# Patient Record
Sex: Male | Born: 1950 | Race: Black or African American | Hispanic: No | Marital: Married | Smoking: Never smoker
Health system: Southern US, Community
[De-identification: ages and names within clinical notes are randomized; demographics above are authoritative.]

## PROBLEM LIST (undated history)

## (undated) ENCOUNTER — Inpatient Hospital Stay: Admission: EM | Payer: Self-pay | Source: Home / Self Care

## (undated) DIAGNOSIS — I10 Essential (primary) hypertension: Secondary | ICD-10-CM

## (undated) DIAGNOSIS — I219 Acute myocardial infarction, unspecified: Secondary | ICD-10-CM

## (undated) DIAGNOSIS — C801 Malignant (primary) neoplasm, unspecified: Secondary | ICD-10-CM

## (undated) HISTORY — DX: Essential (primary) hypertension: I10

## (undated) HISTORY — PX: TONSILLECTOMY: SUR1361

---

## 2015-08-15 ENCOUNTER — Encounter (HOSPITAL_COMMUNITY): Payer: Self-pay | Admitting: Cardiology

## 2015-08-15 ENCOUNTER — Encounter (HOSPITAL_COMMUNITY)
Admission: AD | Disposition: A | Discharge status: Home / Self Care | Payer: Self-pay | Source: Other Acute Inpatient Hospital | Attending: Cardiology

## 2015-08-15 ENCOUNTER — Ambulatory Visit (HOSPITAL_COMMUNITY): Payer: BLUE CROSS/BLUE SHIELD

## 2015-08-15 ENCOUNTER — Inpatient Hospital Stay (HOSPITAL_COMMUNITY)
Admission: AD | Admit: 2015-08-15 | Discharge: 2015-08-16 | DRG: 246 | Disposition: A | Discharge status: Home / Self Care | Payer: BLUE CROSS/BLUE SHIELD | Source: Other Acute Inpatient Hospital | Attending: Cardiology | Admitting: Cardiology

## 2015-08-15 ENCOUNTER — Encounter (HOSPITAL_COMMUNITY)
Admission: AD | Disposition: A | Discharge status: Home / Self Care | Payer: Medicare Other | Source: Other Acute Inpatient Hospital | Attending: Cardiology

## 2015-08-15 DIAGNOSIS — R7303 Prediabetes: Secondary | ICD-10-CM | POA: Diagnosis present

## 2015-08-15 DIAGNOSIS — I200 Unstable angina: Secondary | ICD-10-CM | POA: Diagnosis not present

## 2015-08-15 DIAGNOSIS — Z7902 Long term (current) use of antithrombotics/antiplatelets: Secondary | ICD-10-CM | POA: Diagnosis not present

## 2015-08-15 DIAGNOSIS — Z720 Tobacco use: Secondary | ICD-10-CM | POA: Insufficient documentation

## 2015-08-15 DIAGNOSIS — I4510 Unspecified right bundle-branch block: Secondary | ICD-10-CM | POA: Diagnosis present

## 2015-08-15 DIAGNOSIS — I10 Essential (primary) hypertension: Secondary | ICD-10-CM | POA: Diagnosis present

## 2015-08-15 DIAGNOSIS — Q211 Atrial septal defect: Secondary | ICD-10-CM | POA: Diagnosis not present

## 2015-08-15 DIAGNOSIS — R739 Hyperglycemia, unspecified: Secondary | ICD-10-CM | POA: Diagnosis not present

## 2015-08-15 DIAGNOSIS — I2542 Coronary artery dissection: Secondary | ICD-10-CM | POA: Diagnosis not present

## 2015-08-15 DIAGNOSIS — Z7982 Long term (current) use of aspirin: Secondary | ICD-10-CM | POA: Diagnosis not present

## 2015-08-15 DIAGNOSIS — E876 Hypokalemia: Secondary | ICD-10-CM | POA: Diagnosis present

## 2015-08-15 DIAGNOSIS — I214 Non-ST elevation (NSTEMI) myocardial infarction: Secondary | ICD-10-CM | POA: Diagnosis not present

## 2015-08-15 DIAGNOSIS — R079 Chest pain, unspecified: Secondary | ICD-10-CM | POA: Diagnosis not present

## 2015-08-15 DIAGNOSIS — F17200 Nicotine dependence, unspecified, uncomplicated: Secondary | ICD-10-CM | POA: Diagnosis present

## 2015-08-15 DIAGNOSIS — I25110 Atherosclerotic heart disease of native coronary artery with unstable angina pectoris: Secondary | ICD-10-CM | POA: Diagnosis present

## 2015-08-15 DIAGNOSIS — I440 Atrioventricular block, first degree: Secondary | ICD-10-CM | POA: Diagnosis present

## 2015-08-15 DIAGNOSIS — Z9181 History of falling: Secondary | ICD-10-CM

## 2015-08-15 DIAGNOSIS — I237 Postinfarction angina: Secondary | ICD-10-CM | POA: Diagnosis not present

## 2015-08-15 HISTORY — PX: CARDIAC CATHETERIZATION: SHX172

## 2015-08-15 LAB — TYPE AND SCREEN
ABO/RH(D): A POS
Antibody Screen: NEGATIVE

## 2015-08-15 LAB — MAGNESIUM: MAGNESIUM: 2.1 mg/dL (ref 1.7–2.4)

## 2015-08-15 LAB — CBC
HCT: 46.6 % (ref 39.0–52.0)
HEMATOCRIT: 46.6 % (ref 39.0–52.0)
HEMOGLOBIN: 16 g/dL (ref 13.0–17.0)
Hemoglobin: 15.8 g/dL (ref 13.0–17.0)
MCH: 31.1 pg (ref 26.0–34.0)
MCH: 31.3 pg (ref 26.0–34.0)
MCHC: 33.9 g/dL (ref 30.0–36.0)
MCHC: 34.3 g/dL (ref 30.0–36.0)
MCV: 91.7 fL (ref 78.0–100.0)
MCV: 91.2 fL (ref 78.0–100.0)
PLATELETS: 219 10*3/uL (ref 150–400)
Platelets: 218 10*3/uL (ref 150–400)
RBC: 5.08 MIL/uL (ref 4.22–5.81)
RBC: 5.11 MIL/uL (ref 4.22–5.81)
RDW: 12.1 % (ref 11.5–15.5)
RDW: 12 % (ref 11.5–15.5)
WBC: 7.5 10*3/uL (ref 4.0–10.5)
WBC: 8.2 10*3/uL (ref 4.0–10.5)

## 2015-08-15 LAB — COMPREHENSIVE METABOLIC PANEL
ALT: 22 U/L (ref 17–63)
AST: 42 U/L — ABNORMAL HIGH (ref 15–41)
Albumin: 4 g/dL (ref 3.5–5.0)
Alkaline Phosphatase: 77 U/L (ref 38–126)
Anion gap: 12 (ref 5–15)
BUN: 9 mg/dL (ref 6–20)
CHLORIDE: 99 mmol/L — AB (ref 101–111)
CO2: 27 mmol/L (ref 22–32)
CREATININE: 1.04 mg/dL (ref 0.61–1.24)
Calcium: 8.7 mg/dL — ABNORMAL LOW (ref 8.9–10.3)
Glucose, Bld: 220 mg/dL — ABNORMAL HIGH (ref 65–99)
POTASSIUM: 3.7 mmol/L (ref 3.5–5.1)
Sodium: 138 mmol/L (ref 135–145)
TOTAL PROTEIN: 7.1 g/dL (ref 6.5–8.1)
Total Bilirubin: 3.3 mg/dL — ABNORMAL HIGH (ref 0.3–1.2)

## 2015-08-15 LAB — LIPID PANEL
CHOL/HDL RATIO: 4.4 ratio
CHOLESTEROL: 214 mg/dL — AB (ref 0–200)
HDL: 49 mg/dL (ref >40)
LDL Cholesterol: 148 mg/dL — ABNORMAL HIGH (ref 0–99)
TRIGLYCERIDES: 86 mg/dL (ref <150)
VLDL: 17 mg/dL (ref 0–40)

## 2015-08-15 LAB — TROPONIN I: TROPONIN I: 0.5 ng/mL — AB (ref <0.031)

## 2015-08-15 LAB — PROTIME-INR
INR: 1.13 (ref 0.00–1.49)
PROTHROMBIN TIME: 14.7 s (ref 11.6–15.2)

## 2015-08-15 LAB — MRSA PCR SCREENING: MRSA BY PCR: NEGATIVE

## 2015-08-15 LAB — ABO/RH: ABO/RH(D): A POS

## 2015-08-15 SURGERY — LEFT HEART CATH AND CORONARY ANGIOGRAPHY

## 2015-08-15 MED ORDER — SODIUM CHLORIDE 0.9 % IJ SOLN: 3.0000 mL | INTRAMUSCULAR | Status: DC | PRN

## 2015-08-15 MED ORDER — NITROGLYCERIN IN D5W 200-5 MCG/ML-% IV SOLN
3.0000 ug/min | INTRAVENOUS | Status: DC
  Filled 2015-08-15: qty 250

## 2015-08-15 MED ORDER — SODIUM CHLORIDE 0.9 % IJ SOLN
3.0000 mL | INTRAMUSCULAR | Status: DC | PRN
Start: 2015-08-15 — End: 2015-08-16

## 2015-08-15 MED ORDER — PROMETHAZINE HCL 25 MG/ML IJ SOLN: 12.5000 mg | Freq: Four times a day (QID) | INTRAMUSCULAR | Status: DC | PRN

## 2015-08-15 MED ORDER — SODIUM CHLORIDE 0.9 % IV SOLN: 250.0000 mL | INTRAVENOUS | Status: DC | PRN

## 2015-08-15 MED ORDER — EPTIFIBATIDE 75 MG/100ML IV SOLN
2.0000 ug/kg/min | INTRAVENOUS | Status: AC
  Administered 2015-08-15: 2 ug/kg/min via INTRAVENOUS
  Filled 2015-08-15: qty 100

## 2015-08-15 MED ORDER — NITROGLYCERIN 0.4 MG/SPRAY TL SOLN
Status: AC
  Filled 2015-08-15: qty 4.9

## 2015-08-15 MED ORDER — SODIUM CHLORIDE 0.9 % WEIGHT BASED INFUSION: 3.0000 mL/kg/h | INTRAVENOUS | Status: AC

## 2015-08-15 MED ORDER — NITROGLYCERIN 1 MG/10 ML FOR IR/CATH LAB
INTRA_ARTERIAL | Status: AC
  Filled 2015-08-15: qty 10

## 2015-08-15 MED ORDER — MORPHINE SULFATE (PF) 2 MG/ML IV SOLN
INTRAVENOUS | Status: AC
  Filled 2015-08-15: qty 1

## 2015-08-15 MED ORDER — ATORVASTATIN CALCIUM 80 MG PO TABS
80.0000 mg | ORAL_TABLET | Freq: Every day | ORAL | Status: DC
  Filled 2015-08-15: qty 1

## 2015-08-15 MED ORDER — ASPIRIN 81 MG PO CHEW
324.0000 mg | CHEWABLE_TABLET | ORAL | Status: AC
  Administered 2015-08-15: 324 mg via ORAL
  Filled 2015-08-15: qty 4

## 2015-08-15 MED ORDER — HYDROMORPHONE HCL 1 MG/ML IJ SOLN
INTRAMUSCULAR | Status: AC
  Filled 2015-08-15: qty 1

## 2015-08-15 MED ORDER — VERAPAMIL HCL 2.5 MG/ML IV SOLN
INTRAVENOUS | Status: AC
  Filled 2015-08-15: qty 2

## 2015-08-15 MED ORDER — SODIUM CHLORIDE 0.9 % IJ SOLN: 3.0000 mL | Freq: Two times a day (BID) | INTRAMUSCULAR | Status: DC

## 2015-08-15 MED ORDER — ASPIRIN 81 MG PO CHEW: 81.0000 mg | CHEWABLE_TABLET | ORAL | Status: DC

## 2015-08-15 MED ORDER — LIDOCAINE HCL (PF) 1 % IJ SOLN
INTRAMUSCULAR | Status: DC | PRN
Start: 2015-08-15 — End: 2015-08-15
  Administered 2015-08-15: 2 mL

## 2015-08-15 MED ORDER — HEPARIN SODIUM (PORCINE) 1000 UNIT/ML IJ SOLN
INTRAMUSCULAR | Status: AC
  Filled 2015-08-15: qty 1

## 2015-08-15 MED ORDER — FENTANYL CITRATE (PF) 100 MCG/2ML IJ SOLN
INTRAMUSCULAR | Status: AC
  Filled 2015-08-15: qty 2

## 2015-08-15 MED ORDER — SODIUM CHLORIDE 0.9 % IV SOLN
INTRAVENOUS | Status: DC
  Administered 2015-08-15: 999 mL via INTRAVENOUS
  Administered 2015-08-15: 50 mL via INTRAVENOUS

## 2015-08-15 MED ORDER — CLOPIDOGREL BISULFATE 75 MG PO TABS
75.0000 mg | ORAL_TABLET | Freq: Every day | ORAL | Status: DC
Start: 2015-08-15 — End: 2015-08-15
  Administered 2015-08-15: 75 mg via ORAL
  Filled 2015-08-15: qty 1

## 2015-08-15 MED ORDER — HEPARIN (PORCINE) IN NACL 2-0.9 UNIT/ML-% IJ SOLN
INTRAMUSCULAR | Status: AC
  Filled 2015-08-15: qty 1500

## 2015-08-15 MED ORDER — METOPROLOL TARTRATE 12.5 MG HALF TABLET
12.5000 mg | ORAL_TABLET | Freq: Two times a day (BID) | ORAL | Status: DC
  Administered 2015-08-15 – 2015-08-16 (×4): 12.5 mg via ORAL
  Filled 2015-08-15 (×4): qty 1

## 2015-08-15 MED ORDER — ACETAMINOPHEN 325 MG PO TABS: 650.0000 mg | ORAL_TABLET | ORAL | Status: DC | PRN

## 2015-08-15 MED ORDER — ONDANSETRON HCL 4 MG/2ML IJ SOLN
INTRAMUSCULAR | Status: AC
  Filled 2015-08-15: qty 2

## 2015-08-15 MED ORDER — MIDAZOLAM HCL 2 MG/2ML IJ SOLN
INTRAMUSCULAR | Status: DC | PRN
  Administered 2015-08-15 (×2): 1 mg via INTRAVENOUS

## 2015-08-15 MED ORDER — LIDOCAINE HCL (PF) 1 % IJ SOLN
INTRAMUSCULAR | Status: AC
  Filled 2015-08-15: qty 30

## 2015-08-15 MED ORDER — SODIUM CHLORIDE 0.9 % WEIGHT BASED INFUSION
3.0000 mL/kg/h | INTRAVENOUS | Status: AC
  Administered 2015-08-15 (×2): 3 mL/kg/h via INTRAVENOUS

## 2015-08-15 MED ORDER — IOHEXOL 350 MG/ML SOLN
INTRAVENOUS | Status: DC | PRN
  Administered 2015-08-15: 50 mL via INTRAVENOUS
  Administered 2015-08-15 (×2): 100 mL via INTRAVENOUS

## 2015-08-15 MED ORDER — IOHEXOL 350 MG/ML SOLN
INTRAVENOUS | Status: DC | PRN
  Administered 2015-08-15: 75 mL via INTRACARDIAC

## 2015-08-15 MED ORDER — MORPHINE SULFATE (PF) 2 MG/ML IV SOLN
2.0000 mg | INTRAVENOUS | Status: DC | PRN
  Administered 2015-08-15 (×3): 2 mg via INTRAVENOUS
  Filled 2015-08-15: qty 1

## 2015-08-15 MED ORDER — SODIUM CHLORIDE 0.9 % IJ SOLN
3.0000 mL | Freq: Two times a day (BID) | INTRAMUSCULAR | Status: DC
  Administered 2015-08-16: 3 mL via INTRAVENOUS

## 2015-08-15 MED ORDER — BIVALIRUDIN 250 MG IV SOLR
250.0000 mg | INTRAVENOUS | Status: DC | PRN
  Administered 2015-08-15 (×2): 1.75 mg/kg/h via INTRAVENOUS

## 2015-08-15 MED ORDER — FENTANYL CITRATE (PF) 100 MCG/2ML IJ SOLN
INTRAMUSCULAR | Status: DC | PRN
  Administered 2015-08-15 (×4): 25 ug via INTRAVENOUS

## 2015-08-15 MED ORDER — SODIUM CHLORIDE 0.9 % IV SOLN
250.0000 mL | INTRAVENOUS | Status: DC | PRN
Start: 2015-08-15 — End: 2015-08-16

## 2015-08-15 MED ORDER — SODIUM CHLORIDE 0.9 % IJ SOLN
3.0000 mL | Freq: Two times a day (BID) | INTRAMUSCULAR | Status: DC
  Administered 2015-08-15 – 2015-08-16 (×2): 3 mL via INTRAVENOUS

## 2015-08-15 MED ORDER — BIVALIRUDIN BOLUS VIA INFUSION - CUPID
INTRAVENOUS | Status: DC | PRN
  Administered 2015-08-15: 83.325 mg via INTRAVENOUS

## 2015-08-15 MED ORDER — VERAPAMIL HCL 2.5 MG/ML IV SOLN
INTRAVENOUS | Status: DC | PRN
  Administered 2015-08-15: 3 mL via INTRA_ARTERIAL

## 2015-08-15 MED ORDER — BIVALIRUDIN 250 MG IV SOLR
INTRAVENOUS | Status: AC
  Filled 2015-08-15: qty 250

## 2015-08-15 MED ORDER — SODIUM CHLORIDE 0.9 % IV SOLN: INTRAVENOUS | Status: DC

## 2015-08-15 MED ORDER — NITROGLYCERIN 0.4 MG SL SUBL: 0.4000 mg | SUBLINGUAL_TABLET | SUBLINGUAL | Status: DC | PRN

## 2015-08-15 MED ORDER — HYDROMORPHONE HCL 1 MG/ML IJ SOLN
1.0000 mg | Freq: Once | INTRAMUSCULAR | Status: AC
  Administered 2015-08-15: 1 mg via INTRAVENOUS

## 2015-08-15 MED ORDER — POTASSIUM CHLORIDE 10 MEQ/100ML IV SOLN
10.0000 meq | INTRAVENOUS | Status: DC
  Administered 2015-08-15: 10 meq via INTRAVENOUS
  Filled 2015-08-15: qty 100

## 2015-08-15 MED ORDER — ASPIRIN EC 81 MG PO TBEC
81.0000 mg | DELAYED_RELEASE_TABLET | Freq: Every day | ORAL | Status: DC
  Administered 2015-08-16: 81 mg via ORAL
  Filled 2015-08-15: qty 1

## 2015-08-15 MED ORDER — EPTIFIBATIDE 75 MG/100ML IV SOLN
2.0000 ug/kg/min | INTRAVENOUS | Status: DC
  Administered 2015-08-15: 2 ug/kg/min via INTRAVENOUS
  Filled 2015-08-15 (×5): qty 100

## 2015-08-15 MED ORDER — ONDANSETRON HCL 4 MG/2ML IJ SOLN
4.0000 mg | Freq: Four times a day (QID) | INTRAMUSCULAR | Status: DC | PRN
  Administered 2015-08-15 (×4): 4 mg via INTRAVENOUS
  Filled 2015-08-15 (×3): qty 2

## 2015-08-15 MED ORDER — VERAPAMIL HCL 2.5 MG/ML IV SOLN
INTRA_ARTERIAL | Status: DC | PRN
  Administered 2015-08-15: 10:00:00 via INTRA_ARTERIAL

## 2015-08-15 MED ORDER — LIDOCAINE HCL (PF) 1 % IJ SOLN
INTRAMUSCULAR | Status: DC | PRN
  Administered 2015-08-15: 30 mL via SUBCUTANEOUS

## 2015-08-15 MED ORDER — SODIUM CHLORIDE 0.9 % IJ SOLN
3.0000 mL | Freq: Two times a day (BID) | INTRAMUSCULAR | Status: DC
Start: 2015-08-15 — End: 2015-08-15

## 2015-08-15 MED ORDER — HEPARIN SODIUM (PORCINE) 1000 UNIT/ML IJ SOLN
INTRAMUSCULAR | Status: DC | PRN
  Administered 2015-08-15: 5000 [IU] via INTRAVENOUS

## 2015-08-15 MED ORDER — ASPIRIN 300 MG RE SUPP: 300.0000 mg | RECTAL | Status: AC

## 2015-08-15 MED ORDER — TICAGRELOR 90 MG PO TABS
ORAL_TABLET | ORAL | Status: DC | PRN
  Administered 2015-08-15: 180 mg via ORAL

## 2015-08-15 MED ORDER — POTASSIUM CHLORIDE 10 MEQ/100ML IV SOLN
10.0000 meq | INTRAVENOUS | Status: DC
  Administered 2015-08-15 (×3): 10 meq via INTRAVENOUS
  Filled 2015-08-15 (×4): qty 100

## 2015-08-15 MED ORDER — TICAGRELOR 90 MG PO TABS
90.0000 mg | ORAL_TABLET | Freq: Two times a day (BID) | ORAL | Status: DC
  Administered 2015-08-15 – 2015-08-16 (×2): 90 mg via ORAL
  Filled 2015-08-15 (×2): qty 1

## 2015-08-15 MED ORDER — HEPARIN (PORCINE) IN NACL 100-0.45 UNIT/ML-% IJ SOLN
1300.0000 [IU]/h | INTRAMUSCULAR | Status: DC
  Administered 2015-08-15: 1300 [IU]/h via INTRAVENOUS

## 2015-08-15 MED ORDER — NITROGLYCERIN 1 MG/10 ML FOR IR/CATH LAB
INTRA_ARTERIAL | Status: DC | PRN
  Administered 2015-08-15: 200 ug

## 2015-08-15 MED ORDER — MIDAZOLAM HCL 2 MG/2ML IJ SOLN
INTRAMUSCULAR | Status: AC
  Filled 2015-08-15: qty 2

## 2015-08-15 MED ORDER — TICAGRELOR 90 MG PO TABS
ORAL_TABLET | ORAL | Status: AC
  Filled 2015-08-15: qty 2

## 2015-08-15 MED ORDER — TICAGRELOR 90 MG PO TABS
ORAL_TABLET | ORAL | Status: AC
  Filled 2015-08-15: qty 1

## 2015-08-15 SURGICAL SUPPLY — 24 items
BALLN EUPHORA RX 2.0X12 (BALLOONS) ×3
BALLN EUPHORA RX 2.5X12 (BALLOONS) ×3
BALLN ~~LOC~~ EMERGE MR 4.5X12 (BALLOONS) ×3
BALLOON EUPHORA RX 2.0X12 (BALLOONS) ×1 IMPLANT
BALLOON EUPHORA RX 2.5X12 (BALLOONS) ×1 IMPLANT
BALLOON ~~LOC~~ EMERGE MR 4.5X12 (BALLOONS) ×1 IMPLANT
CATH INFINITI 5FR ANG PIGTAIL (CATHETERS) ×3 IMPLANT
CATH OPTITORQUE TIG 4.0 5F (CATHETERS) ×3 IMPLANT
CATH VISTA GUIDE 6FR XBLAD3.5 (CATHETERS) ×3 IMPLANT
DEVICE RAD COMP TR BAND LRG (VASCULAR PRODUCTS) ×3 IMPLANT
ELECT DEFIB PAD ADLT CADENCE (PAD) ×3 IMPLANT
GLIDESHEATH SLEND A-KIT 6F 22G (SHEATH) ×3 IMPLANT
KIT ENCORE 26 ADVANTAGE (KITS) ×6 IMPLANT
KIT HEART LEFT (KITS) ×3 IMPLANT
PACK CARDIAC CATHETERIZATION (CUSTOM PROCEDURE TRAY) ×3 IMPLANT
STENT PROMUS PREM MR 2.5X12 (Permanent Stent) ×3 IMPLANT
STENT PROMUS PREM MR 2.75X24 (Permanent Stent) ×3 IMPLANT
STENT PROMUS PREM MR 4.0X16 (Permanent Stent) ×3 IMPLANT
TRANSDUCER W/STOPCOCK (MISCELLANEOUS) ×3 IMPLANT
TUBING CIL FLEX 10 FLL-RA (TUBING) ×3 IMPLANT
WIRE ASAHI PROWATER 180CM (WIRE) ×3 IMPLANT
WIRE HI TORQ BMW 190CM (WIRE) ×3 IMPLANT
WIRE HI TORQ WHISPER MS 190CM (WIRE) ×6 IMPLANT
WIRE SAFE-T 1.5MM-J .035X260CM (WIRE) ×3 IMPLANT

## 2015-08-15 SURGICAL SUPPLY — 12 items
CATH INFINITI JR4 5F (CATHETERS) ×3 IMPLANT
CATH VISTA GUIDE 6FR XBLAD3.5 (CATHETERS) ×3 IMPLANT
DEVICE RAD COMP TR BAND LRG (VASCULAR PRODUCTS) ×3 IMPLANT
GLIDESHEATH SLEND A-KIT 6F 22G (SHEATH) ×3 IMPLANT
KIT HEART LEFT (KITS) ×3 IMPLANT
PACK CARDIAC CATHETERIZATION (CUSTOM PROCEDURE TRAY) ×3 IMPLANT
PROTECTION STATION PRESSURIZED (MISCELLANEOUS) ×3
STATION PROTECTION PRESSURIZED (MISCELLANEOUS) ×1 IMPLANT
SYR MEDRAD MARK V 150ML (SYRINGE) ×3 IMPLANT
TRANSDUCER W/STOPCOCK (MISCELLANEOUS) ×3 IMPLANT
TUBING CIL FLEX 10 FLL-RA (TUBING) ×3 IMPLANT
WIRE SAFE-T 1.5MM-J .035X260CM (WIRE) ×3 IMPLANT

## 2015-08-15 NOTE — Progress Notes (Signed)
Patient vomited approximately 300 mL. Patient states he has some relief.

## 2015-08-15 NOTE — H&P (Addendum)
Patient ID: Erik Page MRN: [id number], DOB/AGE: [birth year]   Admit date: 08/15/2015   Primary Physician: No primary care provider on file. Primary Cardiologist: None  Pt. Profile:  30 AAM non-smoker who presents with CP and ECG changes.   Problem List  No past medical history on file.  Past Surgical History  Procedure Laterality Date  . Tonsillectomy       Allergies  Allergies not on file  HPI  75 AAM non-smoker who presents with CP and ECG changes.   He reports that at 740pm he was walking at [facility] when he developed CP (like someone was "standing on my chest"), diaphoresis, and nausea. He wanted to the front of the store and call 911. He fell down in front of the store. The patient was unsure if there was LOC. Pain was "20/10."  EMS picked him up, gave him ASA, and transported him the the [place], [place], [place].   He was hypertensive on arrival (155/100) but was otherwise hemodynamically stable, RR 21, P 81, 96% RA. He was experiencing 10/10 CP. ECG reportedly demonstrated RBBB with STD in V1 and V2 with no priors for comparison. Initial troponin was negative x 2. Tox screen was negative. Labs were notable for Tb 2.5, K 2.8, TnI 0.00,  A CT angiogram was performed and demonstrated no PE, dissection, or aneurysm. The ER doctor spoke with a local on call cardiologist who recommended treatment with integrelin, Plavix, metoprolol, and a nitrogylcerin drip. He was given a shot of lovenox (dose unclear). Despite having received these medications and 10mg  IV morphine, he continued to have CP, although it improved to 2/10.  He was transferred to [facility] for futher care.  On arrival, he continued to c/p 2-3/10 CP with nausea. He stated the morphine made him tired but did not help much with the pain. ECG on arrival demonstrated NSR, borderline 1st degree AV block, and prominent T waves in V1/V2. Posterior leads did not demonstrate STEMI.   Mr. Guzowski is a lifelong non-smoker and does not  drink. He has no FH of CAD.    Home Medications  Prior to Admission medications   Not on File    Family History  No family history on file.  Social History  Social History   Social History  . Marital Status: Significant Other    Spouse Name: N/A  . Number of Children: N/A  . Years of Education: N/A   Occupational History  . Not on file.   Social History Main Topics  . Smoking status: Never Smoker   . Smokeless tobacco: Never Used  . Alcohol Use: No  . Drug Use: No  . Sexual Activity: Not on file   Other Topics Concern  . Not on file   Social History Narrative   Works as a Engineer, materials; teaches at a [place]:  No chills, fever, night sweats or weight changes. + sweats Cardiovascular:  See HPI Dermatological: No rash, lesions/masses Respiratory: No cough, dyspnea Urologic: No hematuria, dysuria Abdominal:   + nausea, vomiting. No diarrhea, bright red blood per rectum, melena, or hematemesis Neurologic:  No visual changes, wkns, changes in mental status. All other systems reviewed and are otherwise negative except as noted above.  Physical Exam  There were no vitals taken for this visit.  General: Pleasant, older, tired, in mild distress, diaphoretic Psych: Normal affect. Neuro: Alert and oriented X 3. Moves all extremities spontaneously. HEENT: Normal  Neck: Supple without bruits or JVD. Lungs:  Resp regular and unlabored, CTA. Heart: RRR no s3, s4, or murmurs. Abdomen: Soft, non-tender, non-distended, BS + x 4.  Extremities: No clubbing, cyanosis or edema. DP/PT/Radials 2+ and equal bilaterally.  Labs  Troponin (Point of Care Test) No results for input(s): TROPIPOC in the last 72 hours. No results for input(s): CKTOTAL, CKMB, TROPONINI in the last 72 hours. No results found for: WBC, HGB, HCT, MCV, PLT No results for input(s): NA, K, CL, CO2, BUN, CREATININE, CALCIUM, PROT, BILITOT, ALKPHOS, ALT, AST, GLUCOSE in  the last 168 hours.  Invalid input(s): LABALBU No results found for: CHOL, HDL, LDLCALC, TRIG No results found for: DDIMER   Radiology/Studies  No results found.  ECG  2015-09-08 @137am : ECG on arrival demonstrated NSR, borderline 1st degree AV block, and prominent T waves in V1/V2 08/14/15 @ 2318: NSR with frequent premature beats and couplets 08/14/15 @ 2031: NSR 1st degree AVB. Atypical RBBB with LAD.  ASSESSMENT AND PLAN  32 AAM non-smoker who presents with CP and ECG changes. Although the initial troponins were negative at [facility], the presenting syndrome is typical for ACS and ECG changes are present. PE and acute aortic syndrome has been ruled out with imaging. He has minimal risk factors for ACS aside for male sex and age. His pain has improved substantially with medical therapy but remains about 2/10. The case was discussed with Dr. Martin[place], the on call interventional cardiologist, and he recommended additional attempts at medical management prior to cath lab activation for high risk UA.   UA - transition to UFH per pharmacy - continue integrelin - nitro gtt - ASA - clopidogrel - metoprolol 12.5mg  BID - atorva 80mg  qhs - TTE - NPO for cardiac cath - cycle troponins - lipids, A1c  hypokalemia - 15meqK/hour x 6 hours  Signed, Lamar Sprinkles, MD 08-Sep-2015, 12:32 AM

## 2015-08-15 NOTE — Progress Notes (Addendum)
PATIENT ID: [id number] with no significant past medical history who presented with NSTEMI.  Now s/p successful PCI with DES in the proximal LCx, OM1 and OM3.  INTERVAL HISTORY: Erik Page troponin increased from 0.5 to >65.  He was taken urgently to cardiac catheterization.  He underwent successful PCI of the LCx, OM1 and OM3.  SUBJECTIVE:  Feels well. Still has 1-2/10 precordial chest pain.  This is improved from 20/10 prior to cath.   PHYSICAL EXAM Filed Vitals:   08/15/15 1225 08/15/15 1230 08/15/15 1235 08/15/15 1240  BP: 119/74   112/63  Pulse: 65 86 67 66  Temp:      TempSrc:      Resp: 14 20 16 15   Height:      Weight:      SpO2: 98% 94% 95% 93%   General:  Well-appearing.  NAD. Neck: No JVD Lungs:  CTAB.  No crackles, rhonchi or wheezes Heart:  RRR.  No m/r/g Abdomen:  Soft, NT, ND.  +BS Extremities:  WWP.  No edema.  R wrist TR band in place.  LABS: Lab Results  Component Value Date   TROPONINI >65.00* 08/15/2015   Results for orders placed or performed during the hospital encounter of 08/15/15 (from the past 24 hour(s))  MRSA PCR Screening     Status: None   Collection Time: 08/15/15  1:40 AM  Result Value Ref Range   MRSA by PCR NEGATIVE NEGATIVE  Type and screen [place]     Status: None   Collection Time: 08/15/15  2:21 AM  Result Value Ref Range   ABO/RH(D) A POS    Antibody Screen NEG    Sample Expiration 08/18/2015   CBC     Status: None   Collection Time: 08/15/15  2:21 AM  Result Value Ref Range   WBC 7.5 4.0 - 10.5 K/uL   RBC 5.08 4.22 - 5.81 MIL/uL   Hemoglobin 15.8 13.0 - 17.0 g/dL   HCT 46.6 39.0 - 52.0 %   MCV 91.7 78.0 - 100.0 fL   MCH 31.1 26.0 - 34.0 pg   MCHC 33.9 30.0 - 36.0 g/dL   RDW 12.1 11.5 - 15.5 %   Platelets 219 150 - 400 K/uL  Comprehensive metabolic panel     Status: Abnormal   Collection Time: 08/15/15  2:21 AM  Result Value Ref Range   Sodium 138 135 - 145 mmol/L   Potassium 3.7 3.5 - 5.1 mmol/L   Chloride 99 (L) 101 - 111 mmol/L   CO2 27 22 - 32 mmol/L   Glucose, Bld 220 (H) 65 - 99 mg/dL   BUN 9 6 - 20 mg/dL   Creatinine, Ser 1.04 0.61 - 1.24 mg/dL   Calcium 8.7 (L) 8.9 - 10.3 mg/dL   Total Protein 7.1 6.5 - 8.1 g/dL   Albumin 4.0 3.5 - 5.0 g/dL   AST 42 (H) 15 - 41 U/L   ALT 22 17 - 63 U/L   Alkaline Phosphatase 77 38 - 126 U/L   Total Bilirubin 3.3 (H) 0.3 - 1.2 mg/dL   GFR calc non Af Amer >60 >60 mL/min   GFR calc Af Amer >60 >60 mL/min   Anion gap 12 5 - 15  Magnesium     Status: None   Collection Time: 08/15/15  2:21 AM  Result Value Ref Range   Magnesium 2.1 1.7 - 2.4 mg/dL  Protime-INR     Status: None   Collection Time:  08/15/15  2:21 AM  Result Value Ref Range   Prothrombin Time 14.7 11.6 - 15.2 seconds   INR 1.13 0.00 - 1.49  Troponin I     Status: Abnormal   Collection Time: 08/15/15  2:21 AM  Result Value Ref Range   Troponin I 0.50 (HH) <0.031 ng/mL  Lipid panel     Status: Abnormal   Collection Time: 08/15/15  2:21 AM  Result Value Ref Range   Cholesterol 214 (H) 0 - 200 mg/dL   Triglycerides 86 <150 mg/dL   HDL 49 >40 mg/dL   Total CHOL/HDL Ratio 4.4 RATIO   VLDL 17 0 - 40 mg/dL   LDL Cholesterol 148 (H) 0 - 99 mg/dL  ABO/Rh     Status: None   Collection Time: 08/15/15  2:21 AM  Result Value Ref Range   ABO/RH(D) A POS   Troponin I     Status: Abnormal   Collection Time: 08/15/15  7:44 AM  Result Value Ref Range   Troponin I >65.00 (HH) <0.031 ng/mL  CBC     Status: None   Collection Time: 08/15/15  7:44 AM  Result Value Ref Range   WBC 8.2 4.0 - 10.5 K/uL   RBC 5.11 4.22 - 5.81 MIL/uL   Hemoglobin 16.0 13.0 - 17.0 g/dL   HCT 46.6 39.0 - 52.0 %   MCV 91.2 78.0 - 100.0 fL   MCH 31.3 26.0 - 34.0 pg   MCHC 34.3 30.0 - 36.0 g/dL   RDW 12.0 11.5 - 15.5 %   Platelets 218 150 - 400 K/uL    Intake/Output Summary (Last 24 hours) at 08/15/15 1248 Last data filed at 08/15/15 1232  Gross per 24 hour  Intake      0 ml  Output    300 ml  Net    -300 ml    EKG:  Sinus rhythm rate 70 bpm.  R axis deviation.  ?[place].  LHC 08/15/15: 1. Prox Cx to Mid Cx lesion, 99% stenosed. PCI with Promus Premier DES or 4.0 mm x 16 mm DES (4.6 mm). Post intervention, there is a 0% residual stenosis. 2. Ost 1st Mrg to 1st Mrg lesion, 90% stenosed. Too small for PCI. 3. Ost 2nd Mrg to 2nd Mrg lesion, 90% stenosed. PCI with Promus Premier DES 275 mm x 12 mm (2.6 mm) Post intervention, there is a 0% residual stenosis. 4. 3rd Mrg lesion, 80% stenosed. PCI with Promus Premier DES 2.75 mm x 24 mm overlapping the proximal circumflex stent (tapered 4.2-3.0 mm) Post intervention, there is a 0% residual stenosis. 5. Well-preserved LV function with no regional wall motion modalities   Severe bifurcation circumflex disease with proximal circumflex, OM 2 and OM 3 involvement. Successful difficult PCI involving proximal Circumflex-OM 2 and-OM 3 overlap segment. Rescue PCI of OM 2.   ASSESSMENT AND PLAN:  # NSTEMI: Erik Page troponin increased markedly to >65.  He is now s/p PCI of multiple vessels in the LCx territory.  He has an unrevascularized OM2 lesion that was too small for PCI. - Echo pending - Aspirin 81 mg daily, ticagrelor 90 mg bid - Atorvastatin 80 mg daily.  LDL 148 - Metoprolol 12.5 mg bid - Will add ACE-I/ARB if BP tolerates - Continue nitro gtt.  May need long-acting nitrates.  He still has an OM2 lesion, though the vessel is small.  # Hyperglycemia: Glucose was 200, suggesting diabetes. - Check hgb A1c - Likely start metformin at discharge  Principal Problem:  NSTEMI (non-ST elevated myocardial infarction) [facility]) Active Problems:   Unstable angina ([place])   Dissection of coronary artery   Coronary artery disease involving native coronary artery of native heart with unstable angina pectoris [facility])    Sharol Harness, MD  08/15/2015 12:48 PM

## 2015-08-15 NOTE — Progress Notes (Signed)
Patient returned from cath lab c/o nausea medication given denies chest pain at this time. TR band on right radial removed and pressure dressing applied

## 2015-08-15 NOTE — Interval H&P Note (Signed)
History and Physical Interval Note:  08/15/2015 9:48 AM  Erik Page  has presented today for surgery, with the diagnosis of non-ST elevation MI/acute coronary syndrome.  The various methods of treatment have been discussed with the patient and family. After consideration of risks, benefits and other options for treatment, the patient has consented to  Procedure(s): Left Heart Cath and Coronary Angiography (N/A) With Possible Percutaneous Coronary Intervention as a surgical intervention .  The patient's history has been reviewed, patient examined, no change in status, stable for surgery.  I have reviewed the patient's chart and labs.  Questions were answered to the patient's satisfaction.     [name], [place]  Cath Lab Visit (complete for each Cath Lab visit)  Clinical Evaluation Leading to the Procedure:   ACS: Yes.    Non-ACS:    Anginal Classification: CCS IV  Anti-ischemic medical therapy: Minimal Therapy (1 class of medications)  Non-Invasive Test Results: No non-invasive testing performed  Prior CABG: No previous CABG    TIMI Score  Patient Information:  TIMI Score is 5  Revascularization of the presumed culprit artery  A (9)  Indication: 11; Score: 9 TIMI Score  Patient Information:  TIMI Score is 5  Revascularization of multiple coronary arteries when the culprit artery cannot clearly be determined  A (9)  Indication: 12; Score: 9   HARDING, DAVID W, MD

## 2015-08-15 NOTE — Progress Notes (Signed)
Dr. Ellyn Hack in to check on patient. On O2 2L [place].  Aware that he started having 3/10 chest discomfort-medicated for pain-resting quietly w/eyes closed.

## 2015-08-15 NOTE — Progress Notes (Signed)
Sleeping

## 2015-08-15 NOTE — Progress Notes (Signed)
CRITICAL VALUE ALERT  Critical value received:  troponin  Date of notification:  08/15/15  Time of notification:  0855  Critical value read back: yes  Nurse who received alert:  Gerlene Fee  MD notified (1st page):  Ellen Henri, [place]  Time of first page:  [phone]  MD notified (2nd page):  Time of second page:  Responding MD:  Ellen Henri, PA  Time MD responded:  0900

## 2015-08-15 NOTE — Progress Notes (Addendum)
Inpatient Diabetes Program Recommendations  AACE/ADA: New Consensus Statement on Inpatient Glycemic Control (2015)  Target Ranges:  Prepandial:   less than 140 mg/dL      Peak postprandial:   less than 180 mg/dL (1-2 hours)      Critically ill patients:  140 - 180 mg/dL  Results for BARY, FERRAIOLO (MRN [id number]) as of 08/15/2015 11:21  Ref. Range 08/15/2015 02:21  Glucose Latest Ref Range: 65-99 mg/dL 220 (H)   Review of Glycemic Control  Diabetes history: No Outpatient Diabetes medications: NA Current orders for Inpatient glycemic control: None  Inpatient Diabetes Program Recommendations: Correction (SSI): Initial lab glucose 220 mg/dl on 08/15/15 @ 2:21 am. Please consider ordering CBGs with Novolog correction scale.  Thanks, Barnie Alderman, RN, MSN, CDE Diabetes Coordinator Inpatient Diabetes Program [phone] (Team Pager from [place] to [place]) [phone] ([facility] office) [phone] [facility] office) [phone] [facility] office)

## 2015-08-15 NOTE — Progress Notes (Signed)
Denies chest discomfort. States nausea is better-still a little bit nauseated. Wife in to visit.

## 2015-08-15 NOTE — Progress Notes (Addendum)
Integrilin infusing at 19.5cc/hr to rt hand. 12-lead EKG done. Dr. Ellyn Hack made aware of chest discomfort.

## 2015-08-15 NOTE — Progress Notes (Signed)
Received patient from cath lab continued chest pain. 1/10. Increased to 4/10 Nitro gtt increased to 22.5 mcg. Not relief. Morphine 2 mg iv given. Dr Ellyn Hack in to see patient discussed with patient possible complication and indication to take patient back to cath lab to reevaluate stents. Patient verbalized understanding, family at bedside.

## 2015-08-15 NOTE — Progress Notes (Signed)
ANTICOAGULATION CONSULT NOTE - Initial Consult  Pharmacy Consult for Integrilin and Heparin Indication: chest pain/ACS  No Known Allergies  Patient Measurements:    Ht: 74 in   Wt: 122 kg  IBW: 82 kg Heparin Dosing Weight: 108 kg  Vital Signs:    Labs: No results for input(s): HGB, HCT, PLT, APTT, LABPROT, INR, HEPARINUNFRC, CREATININE, CKTOTAL, CKMB, TROPONINI in the last 72 hours.  CrCl cannot be calculated (Unknown ideal weight.).   Medical History: No past medical history on file.  Medications:  Awaiting electronic med rec  Assessment: 64 y.o. M presented to [facility] hospital. Received plavix 300mg  and integrilin started ~2220 at 78mcg/kg/min - bolus not charted. Pt reports receiving Lovenox shot but this is not charted on the paperwork from [place] but pt can state exactly where they placed the shot so appears that he did receive the injection. ED RN has gone home and [place] closed so unable to gain anymore information. Pt thinks he received the shot around the same time as integrilin was started so ~2200. To continue integrilin here and start heparin.  Labs at [facility]: Trop negative, SCr 1.05, Hgb 16.3, Hct 46.4, Plt 306  Goal of Therapy:  Heparin level 0.3-0.5 units/ml (lower goal with concomitant integrilin) Monitor platelets by anticoagulation protocol: Yes   Plan:  Continue integrilin at 40mcg/kg/min At 0700, start heparin gtt at 1300 units/hr. No bolus. Will f/u heparin level in 6 hours Daily heparin level and CBC  Sherlon Handing, PharmD, BCPS Clinical pharmacist, pager [phone] 08/15/2015,2:12 AM

## 2015-08-15 NOTE — H&P (View-Only) (Signed)
PATIENT ID: [id number] with no significant past medical history who presented with NSTEMI.  Now s/p successful PCI with DES in the proximal LCx, OM1 and OM3.  INTERVAL HISTORY: Mr. Depaulo troponin increased from 0.5 to >65.  He was taken urgently to cardiac catheterization.  He underwent successful PCI of the LCx, OM1 and OM3.  SUBJECTIVE:  Feels well. Still has 1-2/10 precordial chest pain.  This is improved from 20/10 prior to cath.   PHYSICAL EXAM Filed Vitals:   08/15/15 1225 08/15/15 1230 08/15/15 1235 08/15/15 1240  BP: 119/74   112/63  Pulse: 65 86 67 66  Temp:      TempSrc:      Resp: 14 20 16 15   Height:      Weight:      SpO2: 98% 94% 95% 93%   General:  Well-appearing.  NAD. Neck: No JVD Lungs:  CTAB.  No crackles, rhonchi or wheezes Heart:  RRR.  No m/r/g Abdomen:  Soft, NT, ND.  +BS Extremities:  WWP.  No edema.  R wrist TR band in place.  LABS: Lab Results  Component Value Date   TROPONINI >65.00* 08/15/2015   Results for orders placed or performed during the hospital encounter of 08/15/15 (from the past 24 hour(s))  MRSA PCR Screening     Status: None   Collection Time: 08/15/15  1:40 AM  Result Value Ref Range   MRSA by PCR NEGATIVE NEGATIVE  Type and screen [place]     Status: None   Collection Time: 08/15/15  2:21 AM  Result Value Ref Range   ABO/RH(D) A POS    Antibody Screen NEG    Sample Expiration 08/18/2015   CBC     Status: None   Collection Time: 08/15/15  2:21 AM  Result Value Ref Range   WBC 7.5 4.0 - 10.5 K/uL   RBC 5.08 4.22 - 5.81 MIL/uL   Hemoglobin 15.8 13.0 - 17.0 g/dL   HCT 46.6 39.0 - 52.0 %   MCV 91.7 78.0 - 100.0 fL   MCH 31.1 26.0 - 34.0 pg   MCHC 33.9 30.0 - 36.0 g/dL   RDW 12.1 11.5 - 15.5 %   Platelets 219 150 - 400 K/uL  Comprehensive metabolic panel     Status: Abnormal   Collection Time: 08/15/15  2:21 AM  Result Value Ref Range   Sodium 138 135 - 145 mmol/L   Potassium 3.7 3.5 - 5.1 mmol/L   Chloride 99 (L) 101 - 111 mmol/L   CO2 27 22 - 32 mmol/L   Glucose, Bld 220 (H) 65 - 99 mg/dL   BUN 9 6 - 20 mg/dL   Creatinine, Ser 1.04 0.61 - 1.24 mg/dL   Calcium 8.7 (L) 8.9 - 10.3 mg/dL   Total Protein 7.1 6.5 - 8.1 g/dL   Albumin 4.0 3.5 - 5.0 g/dL   AST 42 (H) 15 - 41 U/L   ALT 22 17 - 63 U/L   Alkaline Phosphatase 77 38 - 126 U/L   Total Bilirubin 3.3 (H) 0.3 - 1.2 mg/dL   GFR calc non Af Amer >60 >60 mL/min   GFR calc Af Amer >60 >60 mL/min   Anion gap 12 5 - 15  Magnesium     Status: None   Collection Time: 08/15/15  2:21 AM  Result Value Ref Range   Magnesium 2.1 1.7 - 2.4 mg/dL  Protime-INR     Status: None   Collection Time:  08/15/15  2:21 AM  Result Value Ref Range   Prothrombin Time 14.7 11.6 - 15.2 seconds   INR 1.13 0.00 - 1.49  Troponin I     Status: Abnormal   Collection Time: 08/15/15  2:21 AM  Result Value Ref Range   Troponin I 0.50 (HH) <0.031 ng/mL  Lipid panel     Status: Abnormal   Collection Time: 08/15/15  2:21 AM  Result Value Ref Range   Cholesterol 214 (H) 0 - 200 mg/dL   Triglycerides 86 <150 mg/dL   HDL 49 >40 mg/dL   Total CHOL/HDL Ratio 4.4 RATIO   VLDL 17 0 - 40 mg/dL   LDL Cholesterol 148 (H) 0 - 99 mg/dL  ABO/Rh     Status: None   Collection Time: 08/15/15  2:21 AM  Result Value Ref Range   ABO/RH(D) A POS   Troponin I     Status: Abnormal   Collection Time: 08/15/15  7:44 AM  Result Value Ref Range   Troponin I >65.00 (HH) <0.031 ng/mL  CBC     Status: None   Collection Time: 08/15/15  7:44 AM  Result Value Ref Range   WBC 8.2 4.0 - 10.5 K/uL   RBC 5.11 4.22 - 5.81 MIL/uL   Hemoglobin 16.0 13.0 - 17.0 g/dL   HCT 46.6 39.0 - 52.0 %   MCV 91.2 78.0 - 100.0 fL   MCH 31.3 26.0 - 34.0 pg   MCHC 34.3 30.0 - 36.0 g/dL   RDW 12.0 11.5 - 15.5 %   Platelets 218 150 - 400 K/uL    Intake/Output Summary (Last 24 hours) at 08/15/15 1248 Last data filed at 08/15/15 1232  Gross per 24 hour  Intake      0 ml  Output    300 ml  Net    -300 ml    EKG:  Sinus rhythm rate 70 bpm.  R axis deviation.  ?[place].  LHC 08/15/15: 1. Prox Cx to Mid Cx lesion, 99% stenosed. PCI with Promus Premier DES or 4.0 mm x 16 mm DES (4.6 mm). Post intervention, there is a 0% residual stenosis. 2. Ost 1st Mrg to 1st Mrg lesion, 90% stenosed. Too small for PCI. 3. Ost 2nd Mrg to 2nd Mrg lesion, 90% stenosed. PCI with Promus Premier DES 275 mm x 12 mm (2.6 mm) Post intervention, there is a 0% residual stenosis. 4. 3rd Mrg lesion, 80% stenosed. PCI with Promus Premier DES 2.75 mm x 24 mm overlapping the proximal circumflex stent (tapered 4.2-3.0 mm) Post intervention, there is a 0% residual stenosis. 5. Well-preserved LV function with no regional wall motion modalities   Severe bifurcation circumflex disease with proximal circumflex, OM 2 and OM 3 involvement. Successful difficult PCI involving proximal Circumflex-OM 2 and-OM 3 overlap segment. Rescue PCI of OM 2.   ASSESSMENT AND PLAN:  # NSTEMI: Mr. Saldutti troponin increased markedly to >65.  He is now s/p PCI of multiple vessels in the LCx territory.  He has an unrevascularized OM2 lesion that was too small for PCI. - Echo pending - Aspirin 81 mg daily, ticagrelor 90 mg bid - Atorvastatin 80 mg daily.  LDL 148 - Metoprolol 12.5 mg bid - Will add ACE-I/ARB if BP tolerates - Continue nitro gtt.  May need long-acting nitrates.  He still has an OM2 lesion, though the vessel is small.  # Hyperglycemia: Glucose was 200, suggesting diabetes. - Check hgb A1c - Likely start metformin at discharge  Principal Problem:  NSTEMI (non-ST elevated myocardial infarction) [facility]) Active Problems:   Unstable angina ([place])   Dissection of coronary artery   Coronary artery disease involving native coronary artery of native heart with unstable angina pectoris [facility])    Sharol Harness, MD  08/15/2015 12:48 PM

## 2015-08-15 NOTE — Progress Notes (Signed)
Utilization review completed. Tamme Mozingo, RN, BSN. 

## 2015-08-15 NOTE — H&P (View-Only) (Signed)
Patient Profile: 64 AAM non-smoker who presented with CP and ECG changes. Ruled in for NSTEMI with initial troponin level at 0.50.  Subjective: Currently CP. No dyspnea. Drowsy this morning. Wife by bedside.   Objective: Vital signs in last 24 hours: Temp:  [97.6 F (36.4 C)-97.8 F (36.6 C)] 97.8 F (36.6 C) (11/22 0500) Pulse Rate:  [64-83] 64 (11/22 0500) Resp:  [0-19] 17 (11/22 0500) BP: (109-128)/(72-84) 109/72 mmHg (11/22 0500) SpO2:  [95 %-99 %] 95 % (11/22 0500) Weight:  [245 lb (111.131 kg)] 245 lb (111.131 kg) (11/22 0500)    Intake/Output from previous day:   Intake/Output this shift:    Medications Current Facility-Administered Medications  Medication Dose Route Frequency Provider Last Rate Last Dose  . acetaminophen (TYLENOL) tablet 650 mg  650 mg Oral Q4H PRN Lamar Sprinkles, MD      . Derrill Memo ON 08/16/2015] aspirin EC tablet 81 mg  81 mg Oral Daily Lamar Sprinkles, MD      . atorvastatin (LIPITOR) tablet 80 mg  80 mg Oral q1800 Lamar Sprinkles, MD      . clopidogrel (PLAVIX) tablet 75 mg  75 mg Oral Q breakfast Lamar Sprinkles, MD      . eptifibatide (INTEGRILIN) 75 mg / 100 mL (0.75 mg/mL) infusion  2 mcg/kg/min (Order-Specific) Intravenous Continuous Franky Macho, RPH 19.5 mL/hr at 08/15/15 0228 2 mcg/kg/min at 08/15/15 0228  . heparin ADULT infusion 100 units/mL (25000 units/250 mL)  1,300 Units/hr Intravenous Continuous Franky Macho, RPH      . metoprolol tartrate (LOPRESSOR) tablet 12.5 mg  12.5 mg Oral BID Lamar Sprinkles, MD   12.5 mg at 08/15/15 [id number]  . nitroGLYCERIN (NITROSTAT) SL tablet 0.4 mg  0.4 mg Sublingual Q5 Min x 3 PRN Lamar Sprinkles, MD      . nitroGLYCERIN 50 mg in dextrose 5 % 250 mL (0.2 mg/mL) infusion  3-30 mcg/min Intravenous Titrated Lamar Sprinkles, MD 9 mL/hr at 08/15/15 0227 30 mcg/min at 08/15/15 0227  . ondansetron (ZOFRAN) 4 MG/2ML injection           . ondansetron (ZOFRAN) injection 4 mg  4 mg Intravenous Q6H PRN Lamar Sprinkles, MD   4 mg at 08/15/15 0216  . potassium chloride 10 mEq in 100 mL IVPB  10 mEq Intravenous Q1 Hr x 6 Lamar Sprinkles, MD   10 mEq at 08/15/15 [id number]    PE: General appearance: alert, cooperative, no distress and drowsy Neck: no carotid bruit and no JVD Lungs: clear to auscultation bilaterally Heart: regular rate and rhythm, S1, S2 normal, no murmur, click, rub or gallop Extremities: no LEE Pulses: 2+ and symmetric Skin: warm and dry Neurologic: Grossly normal  Lab Results:   Recent Labs  08/15/15 0221  WBC 7.5  HGB 15.8  HCT 46.6  PLT 219   BMET  Recent Labs  08/15/15 0221  NA 138  K 3.7  CL 99*  CO2 27  GLUCOSE 220*  BUN 9  CREATININE 1.04  CALCIUM 8.7*   PT/INR  Recent Labs  08/15/15 0221  LABPROT 14.7  INR 1.13   Cholesterol  Recent Labs  08/15/15 0221  CHOL 214*   Cardiac Panel (last 3 results)  Recent Labs  08/15/15 0221  TROPONINI 0.50*    Studies/Results: 2D echo pending   Assessment/Plan  Principal Problem:   NSTEMI (non-ST elevated myocardial infarction) ([place]) Active Problems:   Unstable angina ([place])   1. NSTEMI: CP symptoms are c/w angina. CT  negative for PE and aortic syndrome. Initial troponin abnormal at 0.50. Will continue to cycle. VSS but with 4 beat run of NSVT on telemetry. Currently CP free. On Integrelin and heparin per pharmacy. Plan for LHC today to assess coronary anatomy. Continue ASA, Plavix, metoprolol and high dose Lipitor.      LOS: 0 days    Brittainy M. Rosita Fire, PA-C 08/15/2015 8:04 AM  I seen and evaluated the patient in the Cath Lab after being initially evaluated by Ms. Rosita Fire, PA-C. I reviewed the chart. I agree with her findings and assessment.  Mr. Mohs is a 64 year old lifelong smoker with no family history of coronary disease but does have hypertension. He began having chest pain that was severe substernal wall walking in the grocery store at [employer]. He is picked up by EMS,  initially transported to [facility].  He was mildly hypertensive on arrival to [place], initial troponins were negative, however symptoms were very concerning for possible unstable angina.  The cardiologist to [facility] recommended Integrilin and Plavix, and was treated with Lovenox. He was then transported to [facility] for possible catheterization PCI. Plan was for medical management overnight but proceed with catheterization if his symptoms progress.  He has had intermittent chest pain including what chest pain currently. With troponin is now positive at 0.5 and EKG changes he is brought for urgent cardiac catheterization.  Further plans per Cath Report.  Leonie Man, M.D., M.S. Interventional Cardiologist   Pager # [phone]

## 2015-08-15 NOTE — Progress Notes (Signed)
Patient Profile: 68 AAM non-smoker who presented with CP and ECG changes. Ruled in for NSTEMI with initial troponin level at 0.50.  Subjective: Currently CP. No dyspnea. Drowsy this morning. Wife by bedside.   Objective: Vital signs in last 24 hours: Temp:  [97.6 F (36.4 C)-97.8 F (36.6 C)] 97.8 F (36.6 C) (11/22 0500) Pulse Rate:  [64-83] 64 (11/22 0500) Resp:  [0-19] 17 (11/22 0500) BP: (109-128)/(72-84) 109/72 mmHg (11/22 0500) SpO2:  [95 %-99 %] 95 % (11/22 0500) Weight:  [245 lb (111.131 kg)] 245 lb (111.131 kg) (11/22 0500)    Intake/Output from previous day:   Intake/Output this shift:    Medications Current Facility-Administered Medications  Medication Dose Route Frequency Provider Last Rate Last Dose  . acetaminophen (TYLENOL) tablet 650 mg  650 mg Oral Q4H PRN Lamar Sprinkles, MD      . Derrill Memo ON 08/16/2015] aspirin EC tablet 81 mg  81 mg Oral Daily Lamar Sprinkles, MD      . atorvastatin (LIPITOR) tablet 80 mg  80 mg Oral q1800 Lamar Sprinkles, MD      . clopidogrel (PLAVIX) tablet 75 mg  75 mg Oral Q breakfast Lamar Sprinkles, MD      . eptifibatide (INTEGRILIN) 75 mg / 100 mL (0.75 mg/mL) infusion  2 mcg/kg/min (Order-Specific) Intravenous Continuous Franky Macho, RPH 19.5 mL/hr at 08/15/15 0228 2 mcg/kg/min at 08/15/15 0228  . heparin ADULT infusion 100 units/mL (25000 units/250 mL)  1,300 Units/hr Intravenous Continuous Franky Macho, RPH      . metoprolol tartrate (LOPRESSOR) tablet 12.5 mg  12.5 mg Oral BID Lamar Sprinkles, MD   12.5 mg at 08/15/15 [id number]  . nitroGLYCERIN (NITROSTAT) SL tablet 0.4 mg  0.4 mg Sublingual Q5 Min x 3 PRN Lamar Sprinkles, MD      . nitroGLYCERIN 50 mg in dextrose 5 % 250 mL (0.2 mg/mL) infusion  3-30 mcg/min Intravenous Titrated Lamar Sprinkles, MD 9 mL/hr at 08/15/15 0227 30 mcg/min at 08/15/15 0227  . ondansetron (ZOFRAN) 4 MG/2ML injection           . ondansetron (ZOFRAN) injection 4 mg  4 mg Intravenous Q6H PRN Lamar Sprinkles, MD   4 mg at 08/15/15 0216  . potassium chloride 10 mEq in 100 mL IVPB  10 mEq Intravenous Q1 Hr x 6 Lamar Sprinkles, MD   10 mEq at 08/15/15 [id number]    PE: General appearance: alert, cooperative, no distress and drowsy Neck: no carotid bruit and no JVD Lungs: clear to auscultation bilaterally Heart: regular rate and rhythm, S1, S2 normal, no murmur, click, rub or gallop Extremities: no LEE Pulses: 2+ and symmetric Skin: warm and dry Neurologic: Grossly normal  Lab Results:   Recent Labs  08/15/15 0221  WBC 7.5  HGB 15.8  HCT 46.6  PLT 219   BMET  Recent Labs  08/15/15 0221  NA 138  K 3.7  CL 99*  CO2 27  GLUCOSE 220*  BUN 9  CREATININE 1.04  CALCIUM 8.7*   PT/INR  Recent Labs  08/15/15 0221  LABPROT 14.7  INR 1.13   Cholesterol  Recent Labs  08/15/15 0221  CHOL 214*   Cardiac Panel (last 3 results)  Recent Labs  08/15/15 0221  TROPONINI 0.50*    Studies/Results: 2D echo pending   Assessment/Plan  Principal Problem:   NSTEMI (non-ST elevated myocardial infarction) ([place]) Active Problems:   Unstable angina ([place])   1. NSTEMI: CP symptoms are c/w angina. CT  negative for PE and aortic syndrome. Initial troponin abnormal at 0.50. Will continue to cycle. VSS but with 4 beat run of NSVT on telemetry. Currently CP free. On Integrelin and heparin per pharmacy. Plan for LHC today to assess coronary anatomy. Continue ASA, Plavix, metoprolol and high dose Lipitor.      LOS: 0 days    Brittainy M. Rosita Fire, PA-C 08/15/2015 8:04 AM  I seen and evaluated the patient in the Cath Lab after being initially evaluated by Ms. Rosita Fire, PA-C. I reviewed the chart. I agree with her findings and assessment.  Mr. Moomaw is a 64 year old lifelong smoker with no family history of coronary disease but does have hypertension. He began having chest pain that was severe substernal wall walking in the grocery store at [employer]. He is picked up by EMS,  initially transported to [facility].  He was mildly hypertensive on arrival to [place], initial troponins were negative, however symptoms were very concerning for possible unstable angina.  The cardiologist to [facility] recommended Integrilin and Plavix, and was treated with Lovenox. He was then transported to [facility] for possible catheterization PCI. Plan was for medical management overnight but proceed with catheterization if his symptoms progress.  He has had intermittent chest pain including what chest pain currently. With troponin is now positive at 0.5 and EKG changes he is brought for urgent cardiac catheterization.  Further plans per Cath Report.  Leonie Man, M.D., M.S. Interventional Cardiologist   Pager # [phone]

## 2015-08-15 NOTE — Interval H&P Note (Signed)
History and Physical Interval Note:  08/15/2015 5:33 PM  Erik Page  has presented today for surgery, with the diagnosis of relook/ Post Infarction Angina from NSTEMI.    The various methods of treatment have been discussed with the patient and family. After consideration of risks, benefits and other options for treatment, the patient has consented to  Procedure(s): Left Heart Cath and Coronary Angiography (N/A) with possible PCI as a surgical intervention .  The patient's history has been reviewed, patient examined, no change in status, stable for surgery.  I have reviewed the patient's chart and labs.  Questions were answered to the patient's satisfaction.     [place], [place]  Cath Lab Visit (complete for each Cath Lab visit)  Clinical Evaluation Leading to the Procedure:   ACS: Yes.    Non-ACS:    Anginal Classification: CCS IV  Anti-ischemic medical therapy: Maximal Therapy (2 or more classes of medications)  Non-Invasive Test Results: No non-invasive testing performed  Prior CABG: No previous CABG

## 2015-08-16 ENCOUNTER — Encounter (HOSPITAL_COMMUNITY): Payer: Self-pay | Admitting: Cardiology

## 2015-08-16 ENCOUNTER — Inpatient Hospital Stay (HOSPITAL_COMMUNITY): Payer: BLUE CROSS/BLUE SHIELD

## 2015-08-16 DIAGNOSIS — R079 Chest pain, unspecified: Secondary | ICD-10-CM

## 2015-08-16 LAB — CBC
HCT: 43.7 % (ref 39.0–52.0)
Hemoglobin: 15 g/dL (ref 13.0–17.0)
MCH: 31.5 pg (ref 26.0–34.0)
MCHC: 34.3 g/dL (ref 30.0–36.0)
MCV: 91.8 fL (ref 78.0–100.0)
Platelets: 209 10*3/uL (ref 150–400)
RBC: 4.76 MIL/uL (ref 4.22–5.81)
RDW: 12.2 % (ref 11.5–15.5)
WBC: 8.9 10*3/uL (ref 4.0–10.5)

## 2015-08-16 LAB — BASIC METABOLIC PANEL
Anion gap: 9 (ref 5–15)
BUN: 6 mg/dL (ref 6–20)
CHLORIDE: 100 mmol/L — AB (ref 101–111)
CO2: 27 mmol/L (ref 22–32)
CREATININE: 0.91 mg/dL (ref 0.61–1.24)
Calcium: 8.1 mg/dL — ABNORMAL LOW (ref 8.9–10.3)
GFR calc Af Amer: >60 mL/min (ref >60)
GFR calc non Af Amer: >60 mL/min (ref >60)
Glucose, Bld: 164 mg/dL — ABNORMAL HIGH (ref 65–99)
POTASSIUM: 4.2 mmol/L (ref 3.5–5.1)
Sodium: 136 mmol/L (ref 135–145)

## 2015-08-16 LAB — HEMOGLOBIN A1C
HEMOGLOBIN A1C: 6.2 % — AB (ref 4.8–5.6)
Mean Plasma Glucose: 131 mg/dL

## 2015-08-16 LAB — POCT ACTIVATED CLOTTING TIME: Activated Clotting Time: 399 seconds

## 2015-08-16 MED ORDER — SODIUM CHLORIDE 0.9 % IV SOLN
1.0000 g | Freq: Once | INTRAVENOUS | Status: AC
  Administered 2015-08-16: 1 g via INTRAVENOUS
  Filled 2015-08-16: qty 10

## 2015-08-16 MED ORDER — PROMETHAZINE HCL 25 MG/ML IJ SOLN: 6.2500 mg | Freq: Four times a day (QID) | INTRAMUSCULAR | Status: DC | PRN

## 2015-08-16 MED ORDER — PNEUMOCOCCAL VAC POLYVALENT 25 MCG/0.5ML IJ INJ: 0.5000 mL | INJECTION | INTRAMUSCULAR | Status: DC

## 2015-08-16 MED ORDER — TICAGRELOR 90 MG PO TABS: 90.0000 mg | ORAL_TABLET | Freq: Two times a day (BID) | ORAL | Status: DC

## 2015-08-16 MED ORDER — NITROGLYCERIN 0.4 MG SL SUBL: 0.4000 mg | SUBLINGUAL_TABLET | SUBLINGUAL | Status: DC | PRN

## 2015-08-16 MED ORDER — TICAGRELOR 90 MG PO TABS
90.0000 mg | ORAL_TABLET | Freq: Two times a day (BID) | ORAL | Status: DC
Start: 2015-08-16 — End: 2015-08-28

## 2015-08-16 MED ORDER — ASPIRIN 81 MG PO TBEC: 81.0000 mg | DELAYED_RELEASE_TABLET | Freq: Every day | ORAL | Status: AC

## 2015-08-16 MED ORDER — METOPROLOL TARTRATE 25 MG PO TABS
12.5000 mg | ORAL_TABLET | Freq: Two times a day (BID) | ORAL | Status: DC
Start: 2015-08-16 — End: 2016-08-19

## 2015-08-16 MED ORDER — ATORVASTATIN CALCIUM 80 MG PO TABS: 80.0000 mg | ORAL_TABLET | Freq: Every day | ORAL | Status: DC

## 2015-08-16 MED FILL — Heparin Sodium (Porcine) 2 Unit/ML in Sodium Chloride 0.9%: INTRAMUSCULAR | Qty: 500 | Status: AC

## 2015-08-16 NOTE — Progress Notes (Signed)
PATIENT ID: [id number] with no significant past medical history who presented with NSTEMI.  Now s/p successful PCI with DES in the proximal LCx, OM1 and OM3.  INTERVAL HISTORY: Mr. Kladis underwent successful PCI of the LCx, OM1 and OM3.  He continued to have 1/10 chest pain and went back to the cath lab to evaluate his stents, which were all patent.  SUBJECTIVE:  Denies chest pain.  Concerned about his future and ability to return to a normal life.   PHYSICAL EXAM Filed Vitals:   08/16/15 0600 08/16/15 0700 08/16/15 0742 08/16/15 0800  BP: 112/83 100/71    Pulse: 76 72  81  Temp:   98.8 F (37.1 C)   TempSrc:   Oral   Resp: 16 17  20   Height:      Weight:      SpO2: 100% 100%  100%   General:  Well-appearing.  NAD. Neck: No JVD Lungs:  CTAB.  No crackles, rhonchi or wheezes Heart:  RRR.  No m/r/g Abdomen:  Soft, NT, ND.  +BS Extremities:  WWP.  No edema.    LABS: Lab Results  Component Value Date   TROPONINI >65.00* 08/15/2015   Results for orders placed or performed during the hospital encounter of 08/15/15 (from the past 24 hour(s))  Troponin I     Status: Abnormal   Collection Time: 08/15/15  8:09 PM  Result Value Ref Range   Troponin I >65.00 (HH) <0.031 ng/mL  CBC     Status: None   Collection Time: 08/16/15  3:05 AM  Result Value Ref Range   WBC 8.9 4.0 - 10.5 K/uL   RBC 4.76 4.22 - 5.81 MIL/uL   Hemoglobin 15.0 13.0 - 17.0 g/dL   HCT 43.7 39.0 - 52.0 %   MCV 91.8 78.0 - 100.0 fL   MCH 31.5 26.0 - 34.0 pg   MCHC 34.3 30.0 - 36.0 g/dL   RDW 12.2 11.5 - 15.5 %   Platelets 209 150 - 400 K/uL  Basic metabolic panel     Status: Abnormal   Collection Time: 08/16/15  3:05 AM  Result Value Ref Range   Sodium 136 135 - 145 mmol/L   Potassium 4.2 3.5 - 5.1 mmol/L   Chloride 100 (L) 101 - 111 mmol/L   CO2 27 22 - 32 mmol/L   Glucose, Bld 164 (H) 65 - 99 mg/dL   BUN 6 6 - 20 mg/dL   Creatinine, Ser 0.91 0.61 - 1.24 mg/dL   Calcium 8.1 (L) 8.9 - 10.3 mg/dL   GFR  calc non Af Amer >60 >60 mL/min   GFR calc Af Amer >60 >60 mL/min   Anion gap 9 5 - 15    Intake/Output Summary (Last 24 hours) at 08/16/15 0806 Last data filed at 08/16/15 0400  Gross per 24 hour  Intake  461.9 ml  Output   1300 ml  Net -838.1 ml    EKG:  Sinus rhythm rate 70 bpm.  R axis deviation.  ?[place].  LHC 08/15/15: 1. Prox Cx to Mid Cx lesion, 99% stenosed. PCI with Promus Premier DES or 4.0 mm x 16 mm DES (4.6 mm). Post intervention, there is a 0% residual stenosis. 2. Ost 1st Mrg to 1st Mrg lesion, 90% stenosed. Too small for PCI. 3. Ost 2nd Mrg to 2nd Mrg lesion, 90% stenosed. PCI with Promus Premier DES 275 mm x 12 mm (2.6 mm) Post intervention, there is a 0% residual stenosis. 4. 3rd Mrg  lesion, 80% stenosed. PCI with Promus Premier DES 2.75 mm x 24 mm overlapping the proximal circumflex stent (tapered 4.2-3.0 mm) Post intervention, there is a 0% residual stenosis. 5. Well-preserved LV function with no regional wall motion modalities   Severe bifurcation circumflex disease with proximal circumflex, OM 2 and OM 3 involvement. Successful difficult PCI involving proximal Circumflex-OM 2 and-OM 3 overlap segment. Rescue PCI of OM 2.   ASSESSMENT AND PLAN:  # NSTEMI: Mr. Buntin troponin increased markedly to >65.  He is now s/p PCI of multiple vessels in the LCx territory.  He has an unrevascularized OM1 lesion that was too small for PCI.  He  - Echo pending - Aspirin 81 mg daily, ticagrelor 90 mg bid - Atorvastatin 80 mg daily.  LDL 148 - Metoprolol 12.5 mg bid - BP too low for ACE-I/ARB.  May be able to add as an outpatient. - Discontinue nitro gtt.  Start long-acting nitrates if he has recurrent chest pain. - Cardiac rehab.  # Hyperglycemia: Glucose was 200 on admission. - A1c 6.2.  Pre-diabetes.  Follow up with PCP and follow low glycemic diet. - Encourage increased exercise  Principal Problem:   NSTEMI (non-ST elevated myocardial infarction) [facility]) Active  Problems:   Unstable angina ([place])   Dissection of coronary artery   Coronary artery disease involving native coronary artery of native heart with unstable angina pectoris ([place])   Hyperglycemia    Sharol Harness, MD  08/16/2015 8:06 AM

## 2015-08-16 NOTE — Discharge Summary (Signed)
Physician Discharge Summary  Patient ID: Erik Page MRN: [id number] DOB/AGE: [birth year] 65 y.o.   Primary Cardiologist: New (Will f/u with Dr. Harl Bowie in [place]).   Admit date: 08/15/2015 Discharge date: 08/16/2015  Admission Diagnoses: NSTEMI  Discharge Diagnoses:  Principal Problem:   NSTEMI (non-ST elevated myocardial infarction) [facility]) Active Problems:   Unstable angina ([place])   Dissection of coronary artery   Coronary artery disease involving native coronary artery of native heart with unstable angina pectoris ([place])   Hyperglycemia   Discharged Condition: stable  HPI: 69 AAM non-smoker who presents with CP and ECG changes.   He reports that at 740pm he was walking at [facility] when he developed CP (like someone was "standing on my chest"), diaphoresis, and nausea. He walked to the front of the store and call 911. He fell down in front of the store. The patient was unsure if there was LOC. Pain was "20/10." EMS picked him up, gave him ASA, and transported him the the [place], [place] [place].   He was hypertensive on arrival (155/100) but was otherwise hemodynamically stable, RR 21, P 81, 96% RA. He was experiencing 10/10 CP. ECG reportedly demonstrated RBBB with STD in V1 and V2 with no priors for comparison. Initial troponin was negative x 2. Tox screen was negative. Labs were notable for Tb 2.5, K 2.8, TnI 0.00, A CT angiogram was performed and demonstrated no PE, dissection, or aneurysm. The ER doctor spoke with a local on call cardiologist who recommended treatment with integrelin, Plavix, metoprolol, and a nitrogylcerin drip. He was given a shot of lovenox (dose unclear). Despite having received these medications and 10mg  IV morphine, he continued to have CP, although it improved to 2/10. He was transferred to [facility] for futher care.   Hospital Course:  On arrival, he continued to c/p 2-3/10 CP with nausea. ECG on arrival demonstrated NSR, borderline 1st degree AV block, and  prominent T waves in V1/V2. Posterior leads did not demonstrate STEMI. Initial troponin was abnormal at 0.50. However, there was a significant increase in his 2nd troponin to >65. He was taken urgently to the cath lab. Procedure performed by Dr. Ellyn Hack. He was found to have severe bifurcation circumflex disease with proximal circumflex, OM 2 and OM 3 involvement. He underwent successful, but difficult PCI involving the proximal circumflex-OM 2 and-OM 3 overlap segment, he also required rescue PCI of the OM 2. LV function was well preserved. He tolerated the procedure well and was placed on DAPT with ASA + Brilinta.  During recovery, he developed mild recurrent CP. Subsequently, he as taken back to the cath lab for a re-look cath. All 3 stents were fully patent. He was monitored overnight and had no recurrent CP. 2D echo showed normal LVF with EF of 55-60%. Additional medications that were prescribed included 80 mg of Lipitor (LDL of 148) and 12.5 mg of metoprolol BID.  He was also noted to have elevated blood glucose levels in the 200s at time of admission. However, his hgb A1c was only 6.2. PCP follow-up was advised.   He was last seen and examined by Dr. Oval Linsey who determined he was stable for discharge home. Post hospital f/u has been arranged with Dr. Harl Bowie in [place].    Consults: None  Significant Diagnostic Studies:   LHC 08/15/15 Conclusion     Prox Cx to Mid Cx lesion, 99% stenosed. PCI with Promus Premier DES or 4.0 mm x 16 mm DES (4.6 mm). Post intervention, there is a 0%  residual stenosis.  Ost 1st Mrg to 1st Mrg lesion, 90% stenosed. Too small for PCI.  Ost 2nd Mrg to 2nd Mrg lesion, 90% stenosed. PCI with Promus Premier DES 275 mm x 12 mm (2.6 mm) Post intervention, there is a 0% residual stenosis.  3rd Mrg lesion, 80% stenosed. PCI with Promus Premier DES 2.75 mm x 24 mm overlapping the proximal circumflex stent (tapered 4.2-3.0 mm) Post intervention, there is a 0% residual  stenosis.  Well-preserved LV function with no regional wall motion modalities   Severe bifurcation circumflex disease with proximal circumflex, OM 2 and OM 3 involvement. Successful difficult PCI involving proximal Circumflex-OM 2 and-OM 3 overlap segment. Rescue PCI of OM 2.    Re-look Cath 08/15/15  Procedures    Left Heart Cath and Coronary Angiography    Conclusion     Patent stents in proximal Circumflex into OM3 as well as Ostial OM2.  Ost 1st Mrg to 1st Mrg lesion, 90% stenosed - the likely culprit lesion for his angina  Dist Cx lesion, 30% stenosed.      2D Echo 08/16/15 Study Conclusions  - Left ventricle: The cavity size was normal. Systolic function was normal. The estimated ejection fraction was in the range of 55% to 60%. Hypokinesis of the lateral and inferolateral myocardium. - Atrial septum: A patent foramen ovale cannot be excluded.   Treatments: See Hospital Course  Discharge Exam: Blood pressure 112/79, pulse 84, temperature 97.7 F (36.5 C), temperature source Oral, resp. rate 22, height 6\' 1"  (1.854 m), weight 245 lb (111.131 kg), SpO2 100 %.   Disposition: Final discharge disposition not confirmed      Discharge Instructions    AMB Referral to Cardiac Rehabilitation - Phase II    Complete by:  As directed   Diagnosis:   Myocardial Infarction PCI       Diet - low sodium heart healthy    Complete by:  As directed      Increase activity slowly    Complete by:  As directed             Medication List    TAKE these medications        aspirin 81 MG EC tablet  Take 1 tablet (81 mg total) by mouth daily.     atorvastatin 80 MG tablet  Commonly known as:  LIPITOR  Take 1 tablet (80 mg total) by mouth daily at 6 PM.     metoprolol tartrate 25 MG tablet  Commonly known as:  LOPRESSOR  Take 0.5 tablets (12.5 mg total) by mouth 2 (two) times daily.     nitroGLYCERIN 0.4 MG SL tablet  Commonly known as:  NITROSTAT  Place 1  tablet (0.4 mg total) under the tongue every 5 (five) minutes x 3 doses as needed for chest pain.     ticagrelor 90 MG Tabs tablet  Commonly known as:  BRILINTA  Take 1 tablet (90 mg total) by mouth 2 (two) times daily.     ticagrelor 90 MG Tabs tablet  Commonly known as:  BRILINTA  Take 1 tablet (90 mg total) by mouth 2 (two) times daily.       Follow-up Information    Follow up with Carlyle Dolly, MD On 08/28/2015.   Specialty:  Cardiology   Why:  2:20 PM (Cardiology follow-up)   Contact information:   [place] [address] [phone]       Follow up with Follow-up with your  Primary Doctor.   Why:  for management of pre-diabetes      TIME SPENT ON DISCHARGE, INCLUDING PHYSICIAN TIME: >30 MINUTES  Signed: Avital Dancy, [place] 08/16/2015, 2:09 PM

## 2015-08-16 NOTE — Progress Notes (Signed)
  Echocardiogram 2D Echocardiogram has been performed.  Erik Page 08/16/2015, 8:37 AM

## 2015-08-16 NOTE — Progress Notes (Signed)
CARDIAC REHAB PHASE I   PRE:  Rate/Rhythm: 79 SR    BP: sitting 112/67    SaO2: 100 RA  MODE:  Ambulation: 350 ft   POST:  Rate/Rhythm: 107ST    BP: sitting 127/79     SaO2: 100 RA  Pt c/o not being able to take a deep breath in lying in bed. Became weak and SOB going to bathroom and moving around room. VSS. Able to walk 350 ft, staggering at times. Denied dizziness. SOB and tired upon sitting in recliner. Ed completed with pt and wife. Very receptive but overwhelmed. Pt became more SOB lying back in recliner and had to sit up. Felt better sitting up/leaning over. Pt sometimes has severe SOB that "grabs" him. Resolves in a few seconds. This is different than the SOB he had lying back. Pt anxious to d/c. Highly encouraged him to take it easy, not allowing his congregants to visit or call. His bedroom is on the third floor. Will send referral to [facility]. Understands importance of Brilinta and I gave him 30 day free card.  [id number]   Josephina Shih [place] CES, ACSM 08/16/2015 12:43 PM

## 2015-08-16 NOTE — Care Management Note (Signed)
Case Management Note  Patient Details  Name: Erik Page MRN: [id number] Date of Birth: December 04, 1950  Subjective/Objective:       Adm w mi             Action/Plan: lives w fam   Expected Discharge Date:                  Expected Discharge Plan:  Home/Self Care  In-House Referral:     Discharge planning Services  CM Consult, Medication Assistance  Post Acute Care Choice:    Choice offered to:     DME Arranged:    DME Agency:     HH Arranged:    [place] Agency:     Status of Service:     Medicare Important Message Given:    Date Medicare IM Given:    Medicare IM give by:    Date Additional Medicare IM Given:    Additional Medicare Important Message give by:     If discussed at [place] of Stay Meetings, dates discussed:    Additional Comments: pt has bcbs ins. Pt given 30day free and copay assist card for brilinta. Pt's [facility] in [place] has [place] in [place].  Lacretia Leigh, RN 08/16/2015, 1:25 PM

## 2015-08-16 NOTE — Progress Notes (Signed)
Pt AOx4, vss, pt and wife given d/c instructions to include meds, diet, activity restrictions, and fallow-up care. Pt and wife verbalize understanding with d/c instructions. Wheeled out to lobby/

## 2015-08-24 ENCOUNTER — Encounter: Payer: Self-pay | Admitting: *Deleted

## 2015-08-28 ENCOUNTER — Encounter: Payer: Self-pay | Admitting: Cardiology

## 2015-08-28 ENCOUNTER — Ambulatory Visit (INDEPENDENT_AMBULATORY_CARE_PROVIDER_SITE_OTHER): Payer: BLUE CROSS/BLUE SHIELD | Admitting: Cardiology

## 2015-08-28 VITALS — BP 107/72 | HR 82 | Ht 74.0 in | Wt 241.0 lb

## 2015-08-28 DIAGNOSIS — I200 Unstable angina: Secondary | ICD-10-CM

## 2015-08-28 DIAGNOSIS — I2510 Atherosclerotic heart disease of native coronary artery without angina pectoris: Secondary | ICD-10-CM | POA: Diagnosis not present

## 2015-08-28 NOTE — Patient Instructions (Signed)
Your physician recommends that you schedule a follow-up appointment in: 3 months with Dr. Harl Bowie  Your physician recommends that you continue on your current medications as directed. Please refer to the Current Medication list given to you today.  PLEASE CALL us IN 1 WEEK WITH AN UPDATE ON YOUR ARM PAIN   Thank you for choosing [facility] [place]!!

## 2015-08-28 NOTE — Progress Notes (Signed)
Patient ID: Arif Stoltman, male   DOB: [birth year], 64 y.o.   MRN: [id number]      Clinical Summary Mr. Postlewait is a 64 y.o.male seen today for hospital follow up appointment.   1. CAD - admit 07/2015 with NSTEMI. Underwent PCI of prox LCX, OM2, and OM3 due to complex bifurcation lesion (3 DES stents total).  - postcath had recurrent chest pain, had relook cath which was stable.   07/2015 echo: LVEF 55-60%, hypokinesis of lateral and inferolateral wall.  - reports SOB is resolving. He is increasing his activity. No chest pain. - compliant with meds. Has some fatigue that is starting to resolve. - he has not been interested in cardiac rehab   2. Left arm pain - mild aching pain left wrist and forearm, this is the site that he had the repeat cath done     PMH 1. CAD   No Known Allergies   Current Outpatient Prescriptions  Medication Sig Dispense Refill  . aspirin EC 81 MG EC tablet Take 1 tablet (81 mg total) by mouth daily.    Marland Kitchen atorvastatin (LIPITOR) 80 MG tablet Take 1 tablet (80 mg total) by mouth daily at 6 PM. 30 tablet 5  . metoprolol tartrate (LOPRESSOR) 25 MG tablet Take 0.5 tablets (12.5 mg total) by mouth 2 (two) times daily. 60 tablet 5  . nitroGLYCERIN (NITROSTAT) 0.4 MG SL tablet Place 1 tablet (0.4 mg total) under the tongue every 5 (five) minutes x 3 doses as needed for chest pain. 25 tablet 2  . ticagrelor (BRILINTA) 90 MG TABS tablet Take 1 tablet (90 mg total) by mouth 2 (two) times daily. 60 tablet 10   No current facility-administered medications for this visit.     Past Surgical History  Procedure Laterality Date  . Tonsillectomy    . Cardiac catheterization N/A 08/15/2015    Procedure: Left Heart Cath and Coronary Angiography;  Surgeon: Leonie Man, MD;  Location: [place] [facility];  Service: Cardiovascular;  Laterality: N/A;  . Cardiac catheterization N/A 08/15/2015    Procedure: Left Heart Cath and Coronary Angiography;  Surgeon: Leonie Man, MD;  Location: [place] [facility];  Service: Cardiovascular;  Laterality: N/A;  . Cardiac catheterization N/A 08/15/2015    Procedure: Coronary Stent Intervention;  Surgeon: Leonie Man, MD;  Location: [place] [facility];  Service: Cardiovascular;  Laterality: N/A;     No Known Allergies    Family History  Problem Relation Age of Onset  . Hypertension       Social History Mr. Horng reports that he has never smoked. He has never used smokeless tobacco. Mr. Madren reports that he does not drink alcohol.   Review of Systems CONSTITUTIONAL: No weight loss, fever, chills, weakness or fatigue.  HEENT: Eyes: No visual loss, blurred vision, double vision or yellow sclerae.No hearing loss, sneezing, congestion, runny nose or sore throat.  SKIN: No rash or itching.  CARDIOVASCULAR: per HPI RESPIRATORY: No shortness of breath, cough or sputum.  GASTROINTESTINAL: No anorexia, nausea, vomiting or diarrhea. No abdominal pain or blood.  GENITOURINARY: No burning on urination, no polyuria NEUROLOGICAL: No headache, dizziness, syncope, paralysis, ataxia, numbness or tingling in the extremities. No change in bowel or bladder control.  MUSCULOSKELETAL: No muscle, back pain, joint pain or stiffness.  LYMPHATICS: No enlarged nodes. No history of splenectomy.  PSYCHIATRIC: No history of depression or anxiety.  ENDOCRINOLOGIC: No reports of sweating, cold or heat intolerance. No polyuria or polydipsia.  Marland Kitchen  Physical Examination Filed Vitals:   08/28/15 1407  BP: 107/72  Pulse: 82   Filed Weights   08/28/15 1407  Weight: 241 lb (109.317 kg)    Gen: resting comfortably, no acute distress HEENT: no scleral icterus, pupils equal round and reactive, no palptable cervical adenopathy,  CV: RRR, no m/r/g, no jvd. Normal bilateral radia lpulses Resp: Clear to auscultation bilaterally GI: abdomen is soft, non-tender, non-distended, normal bowel sounds, no hepatosplenomegaly MSK:  extremities are warm, no edema. Left forearm mild bruising Skin: warm, no rash Neuro:  no focal deficits Psych: appropriate affect    Assessment and Plan  1. CAD - s/p recent complex PCI. No current symptoms, continue DAPT at least 1 year, given complexity of bifurcation lesion and stenting likely consider longer. - he will reconsider rehab and let us know - soft bp's, will not start ACE-I  2. Left arm pain - mild bruising at cath site, normal radial pulse - discussed possible [place], patient not interested at this time. He will monitor for another week and update [place] on symptoms  F/u 3 months      Arnoldo Lenis, M.D

## 2015-10-26 ENCOUNTER — Telehealth: Payer: Self-pay | Admitting: *Deleted

## 2015-10-26 NOTE — Telephone Encounter (Signed)
With his strong history of coronary artery disease I would take any new symptoms seriously. I would recommend an eval in the ER.   Zandra Abts MD

## 2015-10-26 NOTE — Telephone Encounter (Signed)
Pt voices understanding of hx and symptoms and will consider evaluation at [facility] ED, but is not positive he will go today. Explained that CP and SOB are new and ongoing that best place for treatment was ED. Pt appreciative of Dr Harl Bowie advice and will consider this. Pt declined offer for me to call someone for him.

## 2015-10-26 NOTE — Telephone Encounter (Signed)
Pt says X 2 days has CP with SOB on and off taking 1 nitroglycerin on Tuesday got dizzy and vomited. Yesterday felt ok. Today describing pain as "pinging" today not unbearable. Next OV is 3/3. Will forward to Dr. Harl Bowie with pt understanding that if having active CP ED would be best place for evaluation but would like to get Dr Nelly Laurence advice.

## 2015-11-24 ENCOUNTER — Encounter: Payer: Self-pay | Admitting: Cardiology

## 2015-11-24 ENCOUNTER — Encounter: Payer: Self-pay | Admitting: *Deleted

## 2015-11-24 ENCOUNTER — Ambulatory Visit (INDEPENDENT_AMBULATORY_CARE_PROVIDER_SITE_OTHER): Payer: BLUE CROSS/BLUE SHIELD | Admitting: Cardiology

## 2015-11-24 VITALS — BP 106/69 | HR 74 | Ht 74.0 in | Wt 234.0 lb

## 2015-11-24 DIAGNOSIS — I2510 Atherosclerotic heart disease of native coronary artery without angina pectoris: Secondary | ICD-10-CM | POA: Diagnosis not present

## 2015-11-24 DIAGNOSIS — R079 Chest pain, unspecified: Secondary | ICD-10-CM

## 2015-11-24 MED ORDER — LISINOPRIL 2.5 MG PO TABS: 2.5000 mg | ORAL_TABLET | Freq: Every day | ORAL | Status: DC

## 2015-11-24 NOTE — Progress Notes (Signed)
Patient ID: Royse Sewer, male   DOB: April 05, 1951, 65 y.o.   MRN: [id number]     Clinical Summary Mr. Rutkowski is a 65 y.o.male seen today for follow up of the following medical problems.   1. CAD - admit 07/2015 with NSTEMI. Underwent PCI of prox LCX, OM2, and OM3 due to complex bifurcation lesion (3 DES stents total).  - postcath had recurrent chest pain, had relook cath which was stable.  07/2015 echo: LVEF 55-60%, hypokinesis of lateral and inferolateral wall.   - episode 3 weeks ago of chest pain and SOB. N/V at that time.  - feeling of chest tightness left sided, 8/10. + SOB, nauseous and vomiting.  - occasional dull feeling in chest, can be at rest or with exertion. Denies any SOB or DOE - walking on treadmill 30 min-45 min, incline of 5 at brisk walk.  - compliant with meds     Past medical history 1. CAD   No Known Allergies   Current Outpatient Prescriptions  Medication Sig Dispense Refill  . aspirin EC 81 MG EC tablet Take 1 tablet (81 mg total) by mouth daily.    Marland Kitchen atorvastatin (LIPITOR) 80 MG tablet Take 1 tablet (80 mg total) by mouth daily at 6 PM. 30 tablet 5  . metoprolol tartrate (LOPRESSOR) 25 MG tablet Take 0.5 tablets (12.5 mg total) by mouth 2 (two) times daily. 60 tablet 5  . nitroGLYCERIN (NITROSTAT) 0.4 MG SL tablet Place 1 tablet (0.4 mg total) under the tongue every 5 (five) minutes x 3 doses as needed for chest pain. 25 tablet 2  . ticagrelor (BRILINTA) 90 MG TABS tablet Take 1 tablet (90 mg total) by mouth 2 (two) times daily. 60 tablet 10   No current facility-administered medications for this visit.     Past Surgical History  Procedure Laterality Date  . Tonsillectomy    . Cardiac catheterization N/A 08/15/2015    Procedure: Left Heart Cath and Coronary Angiography;  Surgeon: Leonie Man, MD;  Location: [place] [facility];  Service: Cardiovascular;  Laterality: N/A;  . Cardiac catheterization N/A 08/15/2015    Procedure: Left Heart Cath  and Coronary Angiography;  Surgeon: Leonie Man, MD;  Location: [place] [facility];  Service: Cardiovascular;  Laterality: N/A;  . Cardiac catheterization N/A 08/15/2015    Procedure: Coronary Stent Intervention;  Surgeon: Leonie Man, MD;  Location: [place] [facility];  Service: Cardiovascular;  Laterality: N/A;     No Known Allergies    Family History  Problem Relation Age of Onset  . Hypertension       Social History Mr. Gudmundson reports that he has never smoked. He has never used smokeless tobacco. Mr. Zenisek reports that he does not drink alcohol.   Review of Systems CONSTITUTIONAL: No weight loss, fever, chills, weakness or fatigue.  HEENT: Eyes: No visual loss, blurred vision, double vision or yellow sclerae.No hearing loss, sneezing, congestion, runny nose or sore throat.  SKIN: No rash or itching.  CARDIOVASCULAR: per HPI RESPIRATORY: No shortness of breath, cough or sputum.  GASTROINTESTINAL: No anorexia, nausea, vomiting or diarrhea. No abdominal pain or blood.  GENITOURINARY: No burning on urination, no polyuria NEUROLOGICAL: No headache, dizziness, syncope, paralysis, ataxia, numbness or tingling in the extremities. No change in bowel or bladder control.  MUSCULOSKELETAL: No muscle, back pain, joint pain or stiffness.  LYMPHATICS: No enlarged nodes. No history of splenectomy.  PSYCHIATRIC: No history of depression or anxiety.  ENDOCRINOLOGIC: No reports  of sweating, cold or heat intolerance. No polyuria or polydipsia.  Marland Kitchen   Physical Examination Filed Vitals:   11/24/15 0851  BP: 106/69  Pulse: 74   Filed Vitals:   11/24/15 0851  Height: 6\' 2"  (1.88 m)  Weight: 234 lb (106.142 kg)    Gen: resting comfortably, no acute distress HEENT: no scleral icterus, pupils equal round and reactive, no palptable cervical adenopathy,  CV: RRR, no m/r/g, no jvd Resp: Clear to auscultation bilaterally GI: abdomen is soft, non-tender, non-distended, normal bowel  sounds, no hepatosplenomegaly MSK: extremities are warm, no edema.  Skin: warm, no rash Neuro:  no focal deficits Psych: appropriate affect     Assessment and Plan   1. CAD - s/p recent complex PCI. No current symptoms, continue DAPT at least 1 year, given complexity of bifurcation lesion and stenting likely consider longer. - recent episodes of chest pain, will obtain exercise cardiolite - add lisinopril 2.5mg  daily for cardiovascular event benefits. Check BMET in 2 weeks    F/u 3 months        Arnoldo Lenis, M.D.

## 2015-11-24 NOTE — Patient Instructions (Signed)
   Begin Lisinopril 2.5mg  daily - new sent to [facility] today. Continue all other medications.    Lab for BMET - due in 2 weeks (around 12/08/2015) - order given today. Your physician has requested that you have an exercise stress myoview.  For further information please visit [email or web address]. Please follow instruction sheet, as given. Office will contact with results via phone or letter.   Follow up in  3 months.

## 2015-12-08 ENCOUNTER — Inpatient Hospital Stay (HOSPITAL_COMMUNITY): Admission: RE | Admit: 2015-12-08 | Payer: BLUE CROSS/BLUE SHIELD | Source: Ambulatory Visit

## 2015-12-08 ENCOUNTER — Encounter (HOSPITAL_COMMUNITY): Payer: Self-pay

## 2015-12-08 ENCOUNTER — Encounter (HOSPITAL_COMMUNITY)
Admission: RE | Admit: 2015-12-08 | Discharge: 2015-12-08 | Disposition: A | Discharge status: Home / Self Care | Payer: BLUE CROSS/BLUE SHIELD | Source: Ambulatory Visit | Attending: Cardiology | Admitting: Cardiology

## 2015-12-08 DIAGNOSIS — R079 Chest pain, unspecified: Secondary | ICD-10-CM

## 2015-12-08 LAB — NM MYOCAR MULTI W/SPECT W/WALL MOTION / EF
CHL CUP MPHR: 155 {beats}/min
CHL CUP RESTING HR STRESS: 69 {beats}/min
CHL RATE OF PERCEIVED EXERTION: 12
CSEPEDS: 22 s
CSEPEW: 10.1 METS
CSEPHR: 91 %
CSEPPHR: 142 {beats}/min
Exercise duration (min): 8 min
LHR: 0.3
LVDIAVOL: 121 mL (ref 62–150)
LVSYSVOL: 70 mL
SDS: 2
SRS: 16
SSS: 18
TID: 1.01

## 2015-12-08 IMAGING — NM NM MYOCAR MULTI W/SPECT W/WALL MOTION & EF
2 series · 12 of 12 positions shown · non-contrast
Comparison: none

[Series 1: rest · 8.28mm/px · 6 of 64 frames shown]
[frame 6/64]
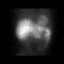
[frame 16/64]
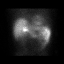
[frame 27/64]
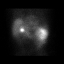
[frame 38/64]
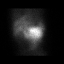
[frame 48/64]
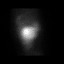
[frame 59/64]
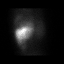

[Series 2: stress gated · 8.28mm/px · 6 of 64 frames shown]
[frame 6/64]
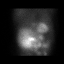
[frame 16/64]
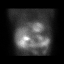
[frame 27/64]
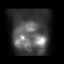
[frame 38/64]
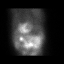
[frame 48/64]
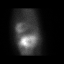
[frame 59/64]
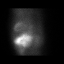

[12 of 12 positions shown; findings below may reference images not displayed]

Canned report from images found in remote index.

Refer to host system for actual result text.

## 2015-12-08 MED ORDER — TECHNETIUM TC 99M SESTAMIBI GENERIC - CARDIOLITE
10.0000 | Freq: Once | INTRAVENOUS | Status: AC | PRN
  Administered 2015-12-08: 10 via INTRAVENOUS

## 2015-12-08 MED ORDER — TECHNETIUM TC 99M SESTAMIBI - CARDIOLITE
30.0000 | Freq: Once | INTRAVENOUS | Status: AC | PRN
  Administered 2015-12-08: 10:00:00 30 via INTRAVENOUS

## 2015-12-12 ENCOUNTER — Telehealth: Payer: Self-pay | Admitting: *Deleted

## 2015-12-12 NOTE — Telephone Encounter (Signed)
-----   Message from Arnoldo Lenis, MD sent at 12/11/2015  1:35 PM EDT ----- Stress test shows evidence of old blockages, no evidence of new blockages. Has he had any more symptoms of chest pain?  Zandra Abts MD

## 2015-12-12 NOTE — Telephone Encounter (Signed)
Would not be concerned, it is common for damaged heart muscle from previous heart attacks to cause PVCs. There were no long runs of PVCs or ventricular arrhythmias that would be more worrisome.   Zandra Abts MD

## 2015-12-12 NOTE — Telephone Encounter (Signed)
Pt made aware and voices understanding. 

## 2015-12-12 NOTE — Telephone Encounter (Signed)
Pt called back asking if he should be concerned over the PVC's he was having while on the treadmill.

## 2015-12-12 NOTE — Telephone Encounter (Signed)
Pt aware and says he has not had anymore chest pain. Routed to pcp and Dr. Francine Graven

## 2016-01-24 ENCOUNTER — Telehealth: Payer: Self-pay | Admitting: Cardiology

## 2016-01-24 MED ORDER — ATORVASTATIN CALCIUM 80 MG PO TABS: 80.0000 mg | ORAL_TABLET | Freq: Every day | ORAL | Status: DC

## 2016-01-24 MED ORDER — LOSARTAN POTASSIUM 25 MG PO TABS: 12.5000 mg | ORAL_TABLET | Freq: Every day | ORAL | Status: DC

## 2016-01-24 NOTE — Telephone Encounter (Signed)
Erik Page returned call to [place]. He requested that you call his home #

## 2016-01-24 NOTE — Telephone Encounter (Signed)
Erik Page called stating that the lisinopril (PRINIVIL,ZESTRIL) 2.5 MG  Is causing him to cough.  Please call his work # until [employer] and then cell #

## 2016-01-24 NOTE — Telephone Encounter (Signed)
Fwd to Dr. Branch.  

## 2016-01-24 NOTE — Telephone Encounter (Signed)
Can stop lisionpril and start losartan 12.5mg  daily in its place  Zandra Abts MD

## 2016-01-24 NOTE — Telephone Encounter (Signed)
Attempted to return call to home number as requested.  Left message.

## 2016-01-24 NOTE — Telephone Encounter (Signed)
Patient notified & verbalized understanding.  New med sent to [facility] today.  Patient also request refill on his Lipitor.  Lipids are in EPIC, done by PMD.  Follow up scheduled for June with Dr. Harl Bowie.

## 2016-01-24 NOTE — Telephone Encounter (Signed)
Left message to return call 

## 2016-03-07 ENCOUNTER — Ambulatory Visit (INDEPENDENT_AMBULATORY_CARE_PROVIDER_SITE_OTHER): Payer: Medicare Other | Admitting: Cardiology

## 2016-03-07 ENCOUNTER — Encounter: Payer: Self-pay | Admitting: Cardiology

## 2016-03-07 VITALS — BP 109/74 | HR 82 | Ht 74.0 in | Wt 231.0 lb

## 2016-03-07 DIAGNOSIS — I2510 Atherosclerotic heart disease of native coronary artery without angina pectoris: Secondary | ICD-10-CM

## 2016-03-07 NOTE — Patient Instructions (Signed)

## 2016-03-07 NOTE — Progress Notes (Signed)
Clinical Summary Erik Page is a 65 y.o.male seen today for follow up of the following medical problems.    1. CAD - admit 07/2015 with NSTEMI. Underwent PCI of prox LCX, OM2, and OM3 due to complex bifurcation lesion (3 DES stents total).  - postcath had recurrent chest pain, had relook cath which was stable.  07/2015 echo: LVEF 55-60%, hypokinesis of lateral and inferolateral wall.  - 11/2015 nuclear stress test with evidence of scar, no current ischemia - since last visit we changed lisinopril to losartan due to cough - denies any chest pain symptoms.       SH: recently retired from teaching  Past medical history 1. CAD    No Known Allergies   Current Outpatient Prescriptions  Medication Sig Dispense Refill  . aspirin EC 81 MG EC tablet Take 1 tablet (81 mg total) by mouth daily.    Marland Kitchen atorvastatin (LIPITOR) 80 MG tablet Take 1 tablet (80 mg total) by mouth daily. 90 tablet 3  . losartan (COZAAR) 25 MG tablet Take 0.5 tablets (12.5 mg total) by mouth daily. 15 tablet 6  . metoprolol tartrate (LOPRESSOR) 25 MG tablet Take 0.5 tablets (12.5 mg total) by mouth 2 (two) times daily. 60 tablet 5  . nitroGLYCERIN (NITROSTAT) 0.4 MG SL tablet Place 1 tablet (0.4 mg total) under the tongue every 5 (five) minutes x 3 doses as needed for chest pain. 25 tablet 2  . ticagrelor (BRILINTA) 90 MG TABS tablet Take 1 tablet (90 mg total) by mouth 2 (two) times daily. 60 tablet 10   No current facility-administered medications for this visit.     Past Surgical History  Procedure Laterality Date  . Tonsillectomy    . Cardiac catheterization N/A 08/15/2015    Procedure: Left Heart Cath and Coronary Angiography;  Surgeon: Erik Man, MD;  Location: [place] [facility];  Service: Cardiovascular;  Laterality: N/A;  . Cardiac catheterization N/A 08/15/2015    Procedure: Left Heart Cath and Coronary Angiography;  Surgeon: Erik Man, MD;  Location: [place] [facility];   Service: Cardiovascular;  Laterality: N/A;  . Cardiac catheterization N/A 08/15/2015    Procedure: Coronary Stent Intervention;  Surgeon: Erik Man, MD;  Location: [place] [facility];  Service: Cardiovascular;  Laterality: N/A;     No Known Allergies    Family History  Problem Relation Age of Onset  . Hypertension       Social History [name] reports that he has never smoked. He has never used smokeless tobacco. [name] reports that he does not drink alcohol.   Review of Systems CONSTITUTIONAL: No weight loss, fever, chills, weakness or fatigue.  HEENT: Eyes: No visual loss, blurred vision, double vision or yellow sclerae.No hearing loss, sneezing, congestion, runny nose or sore throat.  SKIN: No rash or itching.  CARDIOVASCULAR: per HPI RESPIRATORY: No shortness of breath, cough or sputum.  GASTROINTESTINAL: No anorexia, nausea, vomiting or diarrhea. No abdominal pain or blood.  GENITOURINARY: No burning on urination, no polyuria NEUROLOGICAL: No headache, dizziness, syncope, paralysis, ataxia, numbness or tingling in the extremities. No change in bowel or bladder control.  MUSCULOSKELETAL: No muscle, back pain, joint pain or stiffness.  LYMPHATICS: No enlarged nodes. No history of splenectomy.  PSYCHIATRIC: No history of depression or anxiety.  ENDOCRINOLOGIC: No reports of sweating, cold or heat intolerance. No polyuria or polydipsia.  Marland Kitchen   Physical Examination Filed Vitals:   03/07/16 0842  BP: 109/74  Pulse: 82   Filed Vitals:   03/07/16 0842  Height: 6\' 2"  (1.88 m)  Weight: 231 lb (104.781 kg)    Gen: resting comfortably, no acute distress HEENT: no scleral icterus, pupils equal round and reactive, no palptable cervical adenopathy,  CV: RRR, no m/r/g, no jvd Resp: Clear to auscultation bilaterally GI: abdomen is soft, non-tender, non-distended, normal bowel sounds, no hepatosplenomegaly MSK: extremities are warm, no edema.  Skin: warm, no  rash Neuro:  no focal deficits Psych: appropriate affect   Diagnostic Studies  11/2015 Lexiscan MPI  There was no ST segment deviation noted during stress.  Findings consistent with prior myocardial infarction.  This is an intermediate risk study.  The left ventricular ejection fraction is moderately decreased (30-44%).   Assessment and Plan   1. CAD - s/p recent complex PCI. ontinue DAPT at least 1 year, given complexity of bifurcation lesion and stenting likely consider longer. - no recent symptoms - we will continue current meds    F/u 6 months     Erik Page, M.D.

## 2016-08-19 ENCOUNTER — Other Ambulatory Visit: Payer: Self-pay | Admitting: *Deleted

## 2016-08-19 MED ORDER — METOPROLOL TARTRATE 25 MG PO TABS
12.5000 mg | ORAL_TABLET | Freq: Two times a day (BID) | ORAL | 6 refills | Status: DC
Start: 2016-08-19 — End: 2016-11-12

## 2016-08-21 ENCOUNTER — Other Ambulatory Visit: Payer: Self-pay | Admitting: *Deleted

## 2016-08-21 MED ORDER — TICAGRELOR 90 MG PO TABS: 90.0000 mg | ORAL_TABLET | Freq: Two times a day (BID) | ORAL | 0 refills | Status: DC

## 2016-08-21 MED ORDER — NITROGLYCERIN 0.4 MG SL SUBL: 0.4000 mg | SUBLINGUAL_TABLET | SUBLINGUAL | 2 refills | Status: DC | PRN

## 2016-09-06 ENCOUNTER — Encounter: Payer: Self-pay | Admitting: *Deleted

## 2016-09-09 ENCOUNTER — Ambulatory Visit (INDEPENDENT_AMBULATORY_CARE_PROVIDER_SITE_OTHER): Payer: Medicare Other | Admitting: Cardiology

## 2016-09-09 ENCOUNTER — Encounter: Payer: Self-pay | Admitting: Cardiology

## 2016-09-09 VITALS — BP 102/66 | HR 70 | Ht 74.0 in | Wt 236.0 lb

## 2016-09-09 DIAGNOSIS — R0789 Other chest pain: Secondary | ICD-10-CM | POA: Diagnosis not present

## 2016-09-09 DIAGNOSIS — I200 Unstable angina: Secondary | ICD-10-CM | POA: Diagnosis not present

## 2016-09-09 DIAGNOSIS — I25110 Atherosclerotic heart disease of native coronary artery with unstable angina pectoris: Secondary | ICD-10-CM | POA: Diagnosis not present

## 2016-09-09 MED ORDER — TICAGRELOR 90 MG PO TABS: 90.0000 mg | ORAL_TABLET | Freq: Two times a day (BID) | ORAL | 6 refills | Status: DC

## 2016-09-09 NOTE — Progress Notes (Signed)
Clinical Summary Erik Page is a 65 y.o.male seen today for follow up of the following medical problems.    1. CAD - admit 07/2015 with NSTEMI. Underwent PCI of prox LCX, OM2, and OM3 due to complex bifurcation lesion (3 DES stents total).  - postcath had recurrent chest pain, had relook cath which was stable.  07/2015 echo: LVEF 55-60%, hypokinesis of lateral and inferolateral wall.  - 11/2015 nuclear stress test with evidence of scar, no current ischemia - since last visit we changed lisinopril to losartan due to cough - denies any chest pain symptoms.   - reports starting day of Thanksgiving after doing heavy bench pressing had chest pain. Variable in severity, lasted several days. Pain has resolved since that time. Some SOB related. Would last few minutes at at time.  - had some improvement with NG.    Past Medical History 1. CAD   No Known Allergies   Current Outpatient Prescriptions  Medication Sig Dispense Refill  . aspirin EC 81 MG EC tablet Take 1 tablet (81 mg total) by mouth daily.    Marland Kitchen atorvastatin (LIPITOR) 80 MG tablet Take 1 tablet (80 mg total) by mouth daily. 90 tablet 3  . losartan (COZAAR) 25 MG tablet Take 0.5 tablets (12.5 mg total) by mouth daily. 15 tablet 6  . metoprolol tartrate (LOPRESSOR) 25 MG tablet Take 0.5 tablets (12.5 mg total) by mouth 2 (two) times daily. 60 tablet 6  . nitroGLYCERIN (NITROSTAT) 0.4 MG SL tablet Place 1 tablet (0.4 mg total) under the tongue every 5 (five) minutes x 3 doses as needed for chest pain. If no relief after the 3rd dose, proceed to the ED for an evaluation 25 tablet 2  . ticagrelor (BRILINTA) 90 MG TABS tablet Take 1 tablet (90 mg total) by mouth 2 (two) times daily. 60 tablet 0  . zolpidem (AMBIEN) 10 MG tablet Take 1 tablet by mouth at bedtime as needed.     No current facility-administered medications for this visit.      Past Surgical History:  Procedure Laterality Date  . CARDIAC CATHETERIZATION  N/A 08/15/2015   Procedure: Left Heart Cath and Coronary Angiography;  Surgeon: Leonie Man, MD;  Location: [place] [facility];  Service: Cardiovascular;  Laterality: N/A;  . CARDIAC CATHETERIZATION N/A 08/15/2015   Procedure: Left Heart Cath and Coronary Angiography;  Surgeon: Leonie Man, MD;  Location: [place] [facility];  Service: Cardiovascular;  Laterality: N/A;  . CARDIAC CATHETERIZATION N/A 08/15/2015   Procedure: Coronary Stent Intervention;  Surgeon: Leonie Man, MD;  Location: [place] [facility];  Service: Cardiovascular;  Laterality: N/A;  . TONSILLECTOMY       No Known Allergies    Family History  Problem Relation Age of Onset  . Hypertension       Social History Erik Page reports that he has never smoked. He has never used smokeless tobacco. Erik Page reports that he does not drink alcohol.   Review of Systems CONSTITUTIONAL: No weight loss, fever, chills, weakness or fatigue.  HEENT: Eyes: No visual loss, blurred vision, double vision or yellow sclerae.No hearing loss, sneezing, congestion, runny nose or sore throat.  SKIN: No rash or itching.  CARDIOVASCULAR: per HPI RESPIRATORY: No shortness of breath, cough or sputum.  GASTROINTESTINAL: No anorexia, nausea, vomiting or diarrhea. No abdominal pain or blood.  GENITOURINARY: No burning on urination, no polyuria NEUROLOGICAL: No headache, dizziness, syncope, paralysis, ataxia, numbness or tingling in the  extremities. No change in bowel or bladder control.  MUSCULOSKELETAL: No muscle, back pain, joint pain or stiffness.  LYMPHATICS: No enlarged nodes. No history of splenectomy.  PSYCHIATRIC: No history of depression or anxiety.  ENDOCRINOLOGIC: No reports of sweating, cold or heat intolerance. No polyuria or polydipsia.  Marland Kitchen   Physical Examination Vitals:   09/09/16 1613  BP: 102/66  Pulse: 70   Filed Weights   09/09/16 1613  Weight: 236 lb (107 kg)    Gen: resting comfortably, no acute  distress HEENT: no scleral icterus, pupils equal round and reactive, no palptable cervical adenopathy,  CV: RRR, no m/r/g, no jvd Resp: Clear to auscultation bilaterally GI: abdomen is soft, non-tender, non-distended, normal bowel sounds, no hepatosplenomegaly MSK: extremities are warm, no edema.  Skin: warm, no rash Neuro:  no focal deficits Psych: appropriate affect   Diagnostic Studies  07/2015 cath  1. Prox Cx to Mid Cx lesion, 99% stenosed. PCI with Promus Premier DES or 4.0 mm x 16 mm DES (4.6 mm). Post intervention, there is a 0% residual stenosis. 2. Ost 1st Mrg to 1st Mrg lesion, 90% stenosed. Too small for PCI. 3. Ost 2nd Mrg to 2nd Mrg lesion, 90% stenosed. PCI with Promus Premier DES 275 mm x 12 mm (2.6 mm) Post intervention, there is a 0% residual stenosis. 4. 3rd Mrg lesion, 80% stenosed. PCI with Promus Premier DES 2.75 mm x 24 mm overlapping the proximal circumflex stent (tapered 4.2-3.0 mm) Post intervention, there is a 0% residual stenosis. 5. Well-preserved LV function with no regional wall motion modalities    Severe bifurcation circumflex disease with proximal circumflex, OM 2 and OM 3 involvement. Successful difficult PCI involving proximal Circumflex-OM 2 and-OM 3 overlap segment. Rescue PCI of OM 2.  Plan:  Continue Integrilin for 2 more hours post PCI.  Convert from Plavix to [facility]  Expected discharge tomorrow.  If he has mild residual pain, would restart nitroglycerin infusion.   11/2015 Lexiscan MPI  There was no ST segment deviation noted during stress.  Findings consistent with prior myocardial infarction.  This is an intermediate risk study.  The left ventricular ejection fraction is moderately decreased (30-44%).   07/2015 cath  Patent stents in proximal Circumflex into OM3 as well as Ostial OM2.  Ost 1st Mrg to 1st Mrg lesion, 90% stenosed - the likely culprit lesion for his angina  Dist Cx lesion, 30% stenosed.  Assessment  and Plan  1. CAD/Chest pain - s/p recent complex PCI. given complexity of bifurcation lesion and stenting with recent unclear chest pain symptoms will continue DAPT - history of intermittent chest pains after stenting. Second look cath shortly after stents looked good, nuclear stress test 11/2015 without significant ischemia. Recent symptoms not overally consitent with cardiac chest pain and they have resolved, we will continue to monitor at this time.  - EKG without acute ischemic changes   F/u 6 weeks. Ok to push back appt if no recurrence of chest pain.      Arnoldo Lenis, M.D.

## 2016-09-09 NOTE — Patient Instructions (Addendum)
Medication Instructions:   Brilinta refilled today.  Continue all other medications.    Labwork: none  Testing/Procedures: none  Follow-Up: 6 weeks  Any Other Special Instructions Will Be Listed Below (If Applicable).  If you need a refill on your cardiac medications before your next appointment, please call your pharmacy.

## 2016-10-07 ENCOUNTER — Other Ambulatory Visit: Payer: Self-pay | Admitting: Cardiology

## 2016-10-22 ENCOUNTER — Encounter: Payer: Self-pay | Admitting: *Deleted

## 2016-10-23 ENCOUNTER — Encounter: Payer: Self-pay | Admitting: Cardiology

## 2016-10-23 ENCOUNTER — Ambulatory Visit (INDEPENDENT_AMBULATORY_CARE_PROVIDER_SITE_OTHER): Payer: Medicare Other | Admitting: Cardiology

## 2016-10-23 VITALS — BP 101/67 | HR 66 | Resp 99 | Ht 74.0 in | Wt 242.0 lb

## 2016-10-23 DIAGNOSIS — R0789 Other chest pain: Secondary | ICD-10-CM | POA: Diagnosis not present

## 2016-10-23 DIAGNOSIS — I2510 Atherosclerotic heart disease of native coronary artery without angina pectoris: Secondary | ICD-10-CM

## 2016-10-23 NOTE — Patient Instructions (Signed)

## 2016-10-23 NOTE — Progress Notes (Signed)
Clinical Summary Erik Page is a 66 y.o.male seen today for follow up of the following medical problems.    1. CAD - admit 07/2015 with NSTEMI. Underwent PCI of prox LCX, OM2, and OM3 due to complex bifurcation lesion (3 DES stents total).  - postcath had recurrent chest pain, had relook cath which was stable.  07/2015 echo: LVEF 55-60%, hypokinesis of lateral and inferolateral wall.  - 11/2015 nuclear stress test with evidence of scar, no current ischemia - we changed lisinopril to losartan due to cough   - last visit: reported starting day of Thanksgiving after doing heavy bench pressing had chest pain. Variable in severity, lasted several days. Pain has resolved since that time. Some SOB related. Would last few minutes at at time.  - had some improvement with NG.    -since last visit only very mild chest pain at times.Can walk on treadmill 30-45 min brisk, tolerates without troubles.        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    PMH 1. CAD   No Known Allergies   Current Outpatient Prescriptions  Medication Sig Dispense Refill  . aspirin EC 81 MG EC tablet Take 1 tablet (81 mg total) by mouth daily.    Marland Kitchen atorvastatin (LIPITOR) 80 MG tablet Take 1 tablet (80 mg total) by mouth daily. 90 tablet 3  . losartan (COZAAR) 25 MG tablet TAKE ONE-HALF TABLET BY MOUTH DAILY *STOP LISINOPRIL 15 tablet 5  . metoprolol tartrate (LOPRESSOR) 25 MG tablet Take 0.5 tablets (12.5 mg total) by mouth 2 (two) times daily. 60 tablet 6  . nitroGLYCERIN (NITROSTAT) 0.4 MG SL tablet Place 1 tablet (0.4 mg total) under the tongue every 5 (five) minutes x 3 doses as needed for chest pain. If no relief after the 3rd dose, proceed to the ED for an evaluation 25 tablet 2  . ticagrelor (BRILINTA) 90 MG TABS tablet Take 1 tablet (90 mg total) by mouth 2 (two) times daily. 60 tablet 6  . zolpidem (AMBIEN) 10 MG tablet Take 1 tablet by mouth at bedtime as needed.     No current  facility-administered medications for this visit.      Past Surgical History:  Procedure Laterality Date  . CARDIAC CATHETERIZATION N/A 08/15/2015   Procedure: Left Heart Cath and Coronary Angiography;  Surgeon: Leonie Man, MD;  Location: [place] [facility];  Service: Cardiovascular;  Laterality: N/A;  . CARDIAC CATHETERIZATION N/A 08/15/2015   Procedure: Left Heart Cath and Coronary Angiography;  Surgeon: Leonie Man, MD;  Location: [place] [facility];  Service: Cardiovascular;  Laterality: N/A;  . CARDIAC CATHETERIZATION N/A 08/15/2015   Procedure: Coronary Stent Intervention;  Surgeon: Leonie Man, MD;  Location: [place] [facility];  Service: Cardiovascular;  Laterality: N/A;  . TONSILLECTOMY       No Known Allergies    Family History  Problem Relation Age of Onset  . Hypertension       Social History Erik Page reports that he has never smoked. He has never used smokeless tobacco. Erik Page reports that he does not drink alcohol.   Review of Systems CONSTITUTIONAL: No weight loss, fever, chills, weakness or fatigue.  HEENT: Eyes: No visual loss, blurred vision, double vision or yellow sclerae.No hearing loss, sneezing, congestion, runny nose or sore throat.  SKIN: No rash or itching.  CARDIOVASCULAR: per hpi RESPIRATORY: No shortness of breath, cough or sputum.  GASTROINTESTINAL: No anorexia, nausea,  vomiting or diarrhea. No abdominal pain or blood.  GENITOURINARY: No burning on urination, no polyuria NEUROLOGICAL: No headache, dizziness, syncope, paralysis, ataxia, numbness or tingling in the extremities. No change in bowel or bladder control.  MUSCULOSKELETAL: No muscle, back pain, joint pain or stiffness.  LYMPHATICS: No enlarged nodes. No history of splenectomy.  PSYCHIATRIC: No history of depression or anxiety.  ENDOCRINOLOGIC: No reports of sweating, cold or heat intolerance. No polyuria or polydipsia.  Marland Kitchen   Physical Examination Vitals:    10/23/16 1142  BP: 101/67  Pulse: 66  Resp: (!) 99   Vitals:   10/23/16 1142  Weight: 242 lb (109.8 kg)  Height: 6\' 2"  (1.88 m)    Gen: resting comfortably, no acute distress HEENT: no scleral icterus, pupils equal round and reactive, no palptable cervical adenopathy,  CV: RRR, no m/r/g, no jvd Resp: Clear to auscultation bilaterally GI: abdomen is soft, non-tender, non-distended, normal bowel sounds, no hepatosplenomegaly MSK: extremities are warm, no edema.  Skin: warm, no rash Neuro:  no focal deficits Psych: appropriate affect   Diagnostic Studies  07/2015 cath  1. Prox Cx to Mid Cx lesion, 99% stenosed. PCI with Promus Premier DES or 4.0 mm x 16 mm DES (4.6 mm). Post intervention, there is a 0% residual stenosis. 2. Ost 1st Mrg to 1st Mrg lesion, 90% stenosed. Too small for PCI. 3. Ost 2nd Mrg to 2nd Mrg lesion, 90% stenosed. PCI with Promus Premier DES 275 mm x 12 mm (2.6 mm) Post intervention, there is a 0% residual stenosis. 4. 3rd Mrg lesion, 80% stenosed. PCI with Promus Premier DES 2.75 mm x 24 mm overlapping the proximal circumflex stent (tapered 4.2-3.0 mm) Post intervention, there is a 0% residual stenosis. 5. Well-preserved LV function with no regional wall motion modalities   Severe bifurcation circumflex disease with proximal circumflex, OM 2 and OM 3 involvement. Successful difficult PCI involving proximal Circumflex-OM 2 and-OM 3 overlap segment. Rescue PCI of OM 2.  Plan:  Continue Integrilin for 2 more hours post PCI.  Convert from Plavix to [facility]  Expected discharge tomorrow.  If he has mild residual pain, would restart nitroglycerin infusion.   11/2015 Lexiscan MPI  There was no ST segment deviation noted during stress.  Findings consistent with prior myocardial infarction.  This is an intermediate risk study.  The left ventricular ejection fraction is moderately decreased (30-44%).   07/2015 cath  Patent stents in proximal  Circumflex into OM3 as well as Ostial OM2.  Ost 1st Mrg to 1st Mrg lesion, 90% stenosed - the likely culprit lesion for his angina  Dist Cx lesion, 30% stenosed.    Assessment and Plan  1. CAD/Chest pain - s/p recent complex PCI. given complexity of bifurcation lesion and stenting with recent unclear chest pain symptoms will continue DAPT - history of intermittent chest pains after stenting. Second look cath shortly after stents looked good, nuclear stress test 11/2015 without significant ischemia. - overally without significant symptoms at this time. If develop consider trial of ranexa, if severe may need to consider repeat cath. At this time I suspect he may have pain at times due to his 90% OM that was too small for stenting.        Arnoldo Lenis, M.D.

## 2016-10-29 ENCOUNTER — Other Ambulatory Visit: Payer: Self-pay | Admitting: *Deleted

## 2016-10-29 MED ORDER — LOSARTAN POTASSIUM 25 MG PO TABS: 12.5000 mg | ORAL_TABLET | Freq: Every day | ORAL | 3 refills | Status: DC

## 2016-11-12 ENCOUNTER — Other Ambulatory Visit: Payer: Self-pay | Admitting: *Deleted

## 2016-11-12 MED ORDER — METOPROLOL TARTRATE 25 MG PO TABS: 12.5000 mg | ORAL_TABLET | Freq: Two times a day (BID) | ORAL | 1 refills | Status: DC

## 2017-01-29 ENCOUNTER — Other Ambulatory Visit: Payer: Self-pay | Admitting: Cardiology

## 2017-05-31 ENCOUNTER — Other Ambulatory Visit: Payer: Self-pay | Admitting: Cardiology

## 2017-08-04 ENCOUNTER — Other Ambulatory Visit: Payer: Self-pay

## 2017-08-04 ENCOUNTER — Encounter: Payer: Self-pay | Admitting: Cardiology

## 2017-08-04 ENCOUNTER — Ambulatory Visit (INDEPENDENT_AMBULATORY_CARE_PROVIDER_SITE_OTHER): Payer: Medicare Other | Admitting: Cardiology

## 2017-08-04 VITALS — BP 128/82 | HR 75 | Ht 74.0 in | Wt 241.0 lb

## 2017-08-04 DIAGNOSIS — I209 Angina pectoris, unspecified: Secondary | ICD-10-CM | POA: Diagnosis not present

## 2017-08-04 DIAGNOSIS — I25119 Atherosclerotic heart disease of native coronary artery with unspecified angina pectoris: Secondary | ICD-10-CM

## 2017-08-04 MED ORDER — NITROGLYCERIN 0.4 MG SL SUBL: 0.4000 mg | SUBLINGUAL_TABLET | SUBLINGUAL | 3 refills | Status: DC | PRN

## 2017-08-04 MED ORDER — METOPROLOL TARTRATE 25 MG PO TABS: 25.0000 mg | ORAL_TABLET | Freq: Two times a day (BID) | ORAL | 1 refills | Status: DC

## 2017-08-04 NOTE — Progress Notes (Signed)
Clinical Summary Mr. Sheeler is a 66 y.o.male seen today for follow up of the following medical problems.    1. CAD - admit 07/2015 with NSTEMI. Underwent PCI of prox LCX, OM2, and OM3 due to complex bifurcation lesion (3 DES stents total).  - postcath had recurrent chest pain, had relook cath which was stable.  07/2015 echo: LVEF 55-60%, hypokinesis of lateral and inferolateral wall.  - 11/2015 nuclear stress test with evidence of scar, no current ischemia - we changed lisinopril to losartan due to cough    - ongoing chest pain x 2-3 weeks - dull pain throughout chest, though can alternate in locations. +SOB. 5-6/10, however on Saturday more intense while walking at home. - stable frequency, but has increased severity.  - can occur at rest or with exertion. Intermittent episodes.  - can have some mild pains with lifting.  - pain occurring daily.  - reports 2 weeks ago heavy lifting while empyting his large acquarium. Shortly after started developing chest pain.     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   PMH 1 CAD   No Known Allergies   Current Outpatient Medications  Medication Sig Dispense Refill  . aspirin EC 81 MG EC tablet Take 1 tablet (81 mg total) by mouth daily.    Marland Kitchen atorvastatin (LIPITOR) 80 MG tablet TAKE ONE TABLET BY MOUTH DAILY 90 tablet 2  . BRILINTA 90 MG TABS tablet TAKE ONE TABLET BY MOUTH TWICE A DAY 60 tablet 5  . losartan (COZAAR) 25 MG tablet Take 0.5 tablets (12.5 mg total) by mouth daily. 45 tablet 3  . metoprolol tartrate (LOPRESSOR) 25 MG tablet Take 0.5 tablets (12.5 mg total) by mouth 2 (two) times daily. 90 tablet 1  . nitroGLYCERIN (NITROSTAT) 0.4 MG SL tablet Place 1 tablet (0.4 mg total) under the tongue every 5 (five) minutes x 3 doses as needed for chest pain. If no relief after the 3rd dose, proceed to the ED for an evaluation 25 tablet 2   No current facility-administered medications for this visit.      Past  Surgical History:  Procedure Laterality Date  . TONSILLECTOMY       No Known Allergies    Family History  Problem Relation Age of Onset  . Hypertension Unknown      Social History Mr. Mester reports that  has never smoked. he has never used smokeless tobacco. Mr. Santana reports that he does not drink alcohol.   Review of Systems CONSTITUTIONAL: No weight loss, fever, chills, weakness or fatigue.  HEENT: Eyes: No visual loss, blurred vision, double vision or yellow sclerae.No hearing loss, sneezing, congestion, runny nose or sore throat.  SKIN: No rash or itching.  CARDIOVASCULAR: per hpi RESPIRATORY: per hpi GASTROINTESTINAL: No anorexia, nausea, vomiting or diarrhea. No abdominal pain or blood.  GENITOURINARY: No burning on urination, no polyuria NEUROLOGICAL: No headache, dizziness, syncope, paralysis, ataxia, numbness or tingling in the extremities. No change in bowel or bladder control.  MUSCULOSKELETAL: No muscle, back pain, joint pain or stiffness.  LYMPHATICS: No enlarged nodes. No history of splenectomy.  PSYCHIATRIC: No history of depression or anxiety.  ENDOCRINOLOGIC: No reports of sweating, cold or heat intolerance. No polyuria or polydipsia.  Marland Kitchen   Physical Examination Vitals:   08/04/17 0943  BP: 128/82  Pulse: 75  SpO2: 96%   Vitals:   08/04/17 0943  Weight: 241 lb (109.3 kg)  Height: 6\' 2"  (1.88 m)  Gen: resting comfortably, no acute distress HEENT: no scleral icterus, pupils equal round and reactive, no palptable cervical adenopathy,  CV: RRR, no m/r/g, no jvd Resp: Clear to auscultation bilaterally GI: abdomen is soft, non-tender, non-distended, normal bowel sounds, no hepatosplenomegaly MSK: extremities are warm, no edema.  Skin: warm, no rash Neuro:  no focal deficits Psych: appropriate affect   Diagnostic Studies  11/2015 nuclear stress  There was no ST segment deviation noted during stress.  Findings consistent with prior  myocardial infarction.  This is an intermediate risk study.  The left ventricular ejection fraction is moderately decreased (30-44%).   07/2015 echo Study Conclusions  - Left ventricle: The cavity size was normal. Systolic function was   normal. The estimated ejection fraction was in the range of 55%   to 60%. Hypokinesis of the lateral and inferolateral myocardium. - Atrial septum: A patent foramen ovale cannot be excluded.   08/15/15 initial cath 1. Prox Cx to Mid Cx lesion, 99% stenosed. PCI with Promus Premier DES or 4.0 mm x 16 mm DES (4.6 mm). Post intervention, there is a 0% residual stenosis. 2. Ost 1st Mrg to 1st Mrg lesion, 90% stenosed. Too small for PCI. 3. Ost 2nd Mrg to 2nd Mrg lesion, 90% stenosed. PCI with Promus Premier DES 275 mm x 12 mm (2.6 mm) Post intervention, there is a 0% residual stenosis. 4. 3rd Mrg lesion, 80% stenosed. PCI with Promus Premier DES 2.75 mm x 24 mm overlapping the proximal circumflex stent (tapered 4.2-3.0 mm) Post intervention, there is a 0% residual stenosis. 5. Well-preserved LV function with no regional wall motion modalities    Severe bifurcation circumflex disease with proximal circumflex, OM 2 and OM 3 involvement. Successful difficult PCI involving proximal Circumflex-OM 2 and-OM 3 overlap segment. Rescue PCI of OM 2.  Plan:  Continue Integrilin for 2 more hours post PCI.  Convert from Plavix to [facility]  Expected discharge tomorrow.  If he has mild residual pain, would restart nitroglycerin infusion.  08/15/2015 relook cath  Patent stents in proximal Circumflex into OM3 as well as Ostial OM2.  Ost 1st Mrg to 1st Mrg lesion, 90% stenosed - the likely culprit lesion for his angina  Dist Cx lesion, 30% stenosed.  Assessment and Plan    1. CAD/Chest pain - s/p recent complex PCI. given complexity of bifurcation lesion and stenting with recent unclear chest pain symptoms will continue DAPT - history of intermittent  chest pains after stenting. Second look cath shortly after stents looked good, nuclear stress test 11/2015 without significant ischemia. - had done well for several months, now with recurrent chest pain that is progressing I have recommended a cath for him to definitively evaluate his coroanries, I think a stress test would be less optimal given the high pretest probability of obstructive disease. He wishes to think this over and contact us back - increase lopressor to 25mg  bid.      Arnoldo Lenis, M.D.

## 2017-08-04 NOTE — Patient Instructions (Signed)
Your physician recommends that you schedule a follow-up appointment in: TO [place]  Your physician has recommended you make the following change in your medication:   INCREASE LOPRESSOR (METOPROLOL) 25 MG TWICE DAILY  PLEASE LET us KNOW WHAT YOU DECIDE REGARDING A HEART CATHETERIZATION   Thank you for choosing [facility]!!

## 2017-08-05 ENCOUNTER — Ambulatory Visit: Payer: Medicare Other | Admitting: Cardiology

## 2017-08-07 ENCOUNTER — Encounter: Payer: Self-pay | Admitting: Cardiology

## 2017-10-28 ENCOUNTER — Telehealth: Payer: Self-pay | Admitting: Cardiology

## 2017-10-28 MED ORDER — LOSARTAN POTASSIUM 25 MG PO TABS: 12.5000 mg | ORAL_TABLET | Freq: Every day | ORAL | 3 refills | Status: DC

## 2017-10-28 MED ORDER — ATORVASTATIN CALCIUM 80 MG PO TABS: 80.0000 mg | ORAL_TABLET | Freq: Every day | ORAL | 2 refills | Status: DC

## 2017-10-28 MED ORDER — METOPROLOL TARTRATE 25 MG PO TABS: 25.0000 mg | ORAL_TABLET | Freq: Two times a day (BID) | ORAL | 1 refills | Status: DC

## 2017-10-28 NOTE — Telephone Encounter (Signed)
Was told by pharnacy Losartan will cause cancer. Wants to stop medication and start something else

## 2017-10-28 NOTE — Telephone Encounter (Signed)
Patient states wife received letter about her losartan being recalled. Advised patient that after speaking with pharmacy his lot number had not be recalled and he did not need to worry. Patient verbalized understanding and very appreciative.

## 2018-08-11 ENCOUNTER — Other Ambulatory Visit: Payer: Self-pay | Admitting: Cardiology

## 2018-11-12 ENCOUNTER — Other Ambulatory Visit: Payer: Self-pay | Admitting: Cardiology

## 2018-12-11 ENCOUNTER — Other Ambulatory Visit: Payer: Self-pay | Admitting: Cardiology

## 2019-03-12 ENCOUNTER — Other Ambulatory Visit: Payer: Self-pay | Admitting: Cardiology

## 2019-04-08 ENCOUNTER — Other Ambulatory Visit: Payer: Self-pay | Admitting: Cardiology

## 2019-04-18 ENCOUNTER — Other Ambulatory Visit: Payer: Self-pay | Admitting: Cardiology

## 2019-04-19 ENCOUNTER — Telehealth: Payer: Self-pay | Admitting: Cardiology

## 2019-04-19 MED ORDER — LOSARTAN POTASSIUM 25 MG PO TABS: ORAL_TABLET | ORAL | 0 refills | Status: DC

## 2019-04-19 MED ORDER — METOPROLOL TARTRATE 25 MG PO TABS: 25.0000 mg | ORAL_TABLET | Freq: Two times a day (BID) | ORAL | 0 refills | Status: DC

## 2019-04-19 MED ORDER — ATORVASTATIN CALCIUM 80 MG PO TABS: ORAL_TABLET | ORAL | 0 refills | Status: DC

## 2019-04-19 NOTE — Telephone Encounter (Signed)
°*  STAT* If patient is at the pharmacy, call can be transferred to refill team.   1. Which medications need to be refilled?  losartan (COZAAR) 25 MG tablet atorvastatin (LIPITOR) 80 MG tablet metoprolol tartrate (LOPRESSOR) 25 MG tablet     2. Which pharmacy/location (including street and city if local pharmacy) is medication to be sent to?[facility]  3. Do they need a 30 day or 90 day supply? Maximum amount he requested

## 2019-04-26 ENCOUNTER — Other Ambulatory Visit: Payer: Self-pay | Admitting: Cardiology

## 2019-04-26 NOTE — Telephone Encounter (Signed)
° ° ° °  1. Which medications need to be refilled? (please list name of each medication and dose if known)  Lipitor Lopressor Cozaar  2. Which pharmacy/location (including street and city if local pharmacy) is medication to be sent to?  [facility]   3. Do they need a 30 day or 90 day supply?   Last OV 08-04-17   Patient would like a phone call from our office if refills will be done.    [phone]

## 2019-04-26 NOTE — Telephone Encounter (Signed)
Pt will call office back to schedule either virtual or office appt - has not been seen in 2 years

## 2019-05-10 DIAGNOSIS — H52221 Regular astigmatism, right eye: Secondary | ICD-10-CM | POA: Diagnosis not present

## 2019-05-10 DIAGNOSIS — H25813 Combined forms of age-related cataract, bilateral: Secondary | ICD-10-CM | POA: Diagnosis not present

## 2019-05-10 DIAGNOSIS — H5203 Hypermetropia, bilateral: Secondary | ICD-10-CM | POA: Diagnosis not present

## 2019-05-10 DIAGNOSIS — H524 Presbyopia: Secondary | ICD-10-CM | POA: Diagnosis not present

## 2019-05-11 DIAGNOSIS — F419 Anxiety disorder, unspecified: Secondary | ICD-10-CM | POA: Diagnosis not present

## 2019-05-11 DIAGNOSIS — Z0001 Encounter for general adult medical examination with abnormal findings: Secondary | ICD-10-CM | POA: Diagnosis not present

## 2019-05-11 DIAGNOSIS — E782 Mixed hyperlipidemia: Secondary | ICD-10-CM | POA: Diagnosis not present

## 2019-05-11 DIAGNOSIS — I10 Essential (primary) hypertension: Secondary | ICD-10-CM | POA: Diagnosis not present

## 2019-05-28 DIAGNOSIS — Z03818 Encounter for observation for suspected exposure to other biological agents ruled out: Secondary | ICD-10-CM | POA: Diagnosis not present

## 2019-07-19 ENCOUNTER — Telehealth: Payer: Self-pay | Admitting: Cardiology

## 2019-07-19 NOTE — Telephone Encounter (Signed)
Left message to return call 

## 2019-07-19 NOTE — Telephone Encounter (Signed)
Patient called stating that for the past (2) weeks he has been having chest pains, heart feels like it is skipping. States that he is fatigue. He requested to see Dr. Harl Bowie or talk with him. He states that he does not want to go to ER.

## 2019-07-20 MED ORDER — NITROGLYCERIN 0.4 MG SL SUBL: 0.4000 mg | SUBLINGUAL_TABLET | SUBLINGUAL | 3 refills | Status: DC | PRN

## 2019-07-20 NOTE — Telephone Encounter (Signed)
Patient notified and verbalized understanding. 

## 2019-07-20 NOTE — Telephone Encounter (Signed)
No c/o active chest pain.  Did have yesterday, but did not want to go to ED.  Also, noticing fluttering today.  Been having chest pain off/on x 2 weeks.  No c/o sob or dizziness.  Does feel weakness from time to time.  Seems to get worse in the evening time.  Is out of his NTG currently.  Will send refill to [facility] now.  Please return call to cell number:  [phone].  Patient is requesting to see Dr. Harl Bowie - does not want to go to ED.

## 2019-07-20 NOTE — Telephone Encounter (Signed)
Can he see me on the 29th at [facility], I believe its in [place]

## 2019-07-22 ENCOUNTER — Other Ambulatory Visit: Payer: Self-pay

## 2019-07-22 ENCOUNTER — Ambulatory Visit (INDEPENDENT_AMBULATORY_CARE_PROVIDER_SITE_OTHER): Payer: Medicare Other | Admitting: Cardiology

## 2019-07-22 ENCOUNTER — Encounter: Payer: Self-pay | Admitting: Cardiology

## 2019-07-22 VITALS — BP 109/65 | HR 74 | Temp 96.8°F | Ht 74.0 in | Wt 243.0 lb

## 2019-07-22 DIAGNOSIS — I25119 Atherosclerotic heart disease of native coronary artery with unspecified angina pectoris: Secondary | ICD-10-CM | POA: Diagnosis not present

## 2019-07-22 DIAGNOSIS — R079 Chest pain, unspecified: Secondary | ICD-10-CM

## 2019-07-22 NOTE — Progress Notes (Signed)
Clinical Summary Mr. Loeffel is a 68 y.o.male  seen today for follow up of the following medical problems.    1. CAD - admit 07/2015 with NSTEMI. Underwent PCI of prox LCX, OM2, and OM3 due to complex bifurcation lesion (3 DES stents total).  - postcath had recurrent chest pain, had relook cath which was stable.  07/2015 echo: LVEF 55-60%, hypokinesis of lateral and inferolateral wall.  - 11/2015 nuclear stress test with evidence of scar, no current ischemia - we changed lisinopril to losartan due to cough    - chest pain started 2 weeks ago. Intermittent pressure, dull at times. Mid to left chest. 7/10 in severity. Would occur at rest. Not positional. Felt some fluttering in chest, reports checked pulse and was abnormal. Episode lasted about 30 minutes - some recurrent episodes after, not associated skipping. Some fatigue. Symptoms occurring daily. Better with NG. Unsure if similar to prior chest pain.   - remains active, tolerates without troubles - feels like symptoms are improving over time.        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   PMH 1 CAD   PMH 1. CAD   No Known Allergies   Current Outpatient Medications  Medication Sig Dispense Refill  . aspirin EC 81 MG EC tablet Take 1 tablet (81 mg total) by mouth daily.    Marland Kitchen atorvastatin (LIPITOR) 80 MG tablet TAKE ONE TABLET BY MOUTH DAILY  **MUST CALL MD FOR APPOINTMENT 3 tablet 0  . BRILINTA 90 MG TABS tablet TAKE ONE TABLET BY MOUTH TWICE A DAY 60 tablet 5  . losartan (COZAAR) 25 MG tablet TAKE ONE-HALF TABLET BY MOUTH DAILY ( NEEDS TO BE SEEN) 2 tablet 0  . metoprolol tartrate (LOPRESSOR) 25 MG tablet Take 1 tablet (25 mg total) by mouth 2 (two) times daily. 6 tablet 0  . nitroGLYCERIN (NITROSTAT) 0.4 MG SL tablet Place 1 tablet (0.4 mg total) under the tongue every 5 (five) minutes x 3 doses as needed for chest pain. If no relief after the 3rd dose, proceed to the ED for an evaluation 25  tablet 3   No current facility-administered medications for this visit.      Past Surgical History:  Procedure Laterality Date  . CARDIAC CATHETERIZATION N/A 08/15/2015   Procedure: Left Heart Cath and Coronary Angiography;  Surgeon: Leonie Man, MD;  Location: [place] [facility];  Service: Cardiovascular;  Laterality: N/A;  . CARDIAC CATHETERIZATION N/A 08/15/2015   Procedure: Left Heart Cath and Coronary Angiography;  Surgeon: Leonie Man, MD;  Location: [place] [facility];  Service: Cardiovascular;  Laterality: N/A;  . CARDIAC CATHETERIZATION N/A 08/15/2015   Procedure: Coronary Stent Intervention;  Surgeon: Leonie Man, MD;  Location: [place] [facility];  Service: Cardiovascular;  Laterality: N/A;  . TONSILLECTOMY       No Known Allergies    Family History  Problem Relation Age of Onset  . Hypertension Unknown      Social History Mr. Luchsinger reports that he has never smoked. He has never used smokeless tobacco. Mr. Liva reports no history of alcohol use.   Review of Systems CONSTITUTIONAL: No weight loss, fever, chills, weakness or fatigue.  HEENT: Eyes: No visual loss, blurred vision, double vision or yellow sclerae.No hearing loss, sneezing, congestion, runny nose or sore throat.  SKIN: No rash or itching.  CARDIOVASCULAR: per hpi RESPIRATORY: per hpi GASTROINTESTINAL: No anorexia, nausea, vomiting or diarrhea. No abdominal  pain or blood.  GENITOURINARY: No burning on urination, no polyuria NEUROLOGICAL: No headache, dizziness, syncope, paralysis, ataxia, numbness or tingling in the extremities. No change in bowel or bladder control.  MUSCULOSKELETAL: No muscle, back pain, joint pain or stiffness.  LYMPHATICS: No enlarged nodes. No history of splenectomy.  PSYCHIATRIC: No history of depression or anxiety.  ENDOCRINOLOGIC: No reports of sweating, cold or heat intolerance. No polyuria or polydipsia.  Marland Kitchen   Physical Examination Today's Vitals    07/22/19 0944  BP: 109/65  Pulse: 74  Temp: (!) 96.8 F (36 C)  TempSrc: Temporal  SpO2: 98%  Weight: 243 lb (110.2 kg)  Height: 6\' 2"  (1.88 m)   Body mass index is 31.2 kg/m.  Gen: resting comfortably, no acute distress HEENT: no scleral icterus, pupils equal round and reactive, no palptable cervical adenopathy,  CV: RRR, no m/r/g, no jvd Resp: Clear to auscultation bilaterally GI: abdomen is soft, non-tender, non-distended, normal bowel sounds, no hepatosplenomegaly MSK: extremities are warm, no edema.  Skin: warm, no rash Neuro:  no focal deficits Psych: appropriate affect   Diagnostic Studies   11/2015 nuclear stress  There was no ST segment deviation noted during stress.  Findings consistent with prior myocardial infarction.  This is an intermediate risk study.  The left ventricular ejection fraction is moderately decreased (30-44%).   07/2015 echo Study Conclusions  - Left ventricle: The cavity size was normal. Systolic function was normal. The estimated ejection fraction was in the range of 55% to 60%. Hypokinesis of the lateral and inferolateral myocardium. - Atrial septum: A patent foramen ovale cannot be excluded.   08/15/15 initial cath 1. Prox Cx to Mid Cx lesion, 99% stenosed. PCI with Promus Premier DES or 4.0 mm x 16 mm DES (4.6 mm). Post intervention, there is a 0% residual stenosis. 2. Ost 1st Mrg to 1st Mrg lesion, 90% stenosed. Too small for PCI. 3. Ost 2nd Mrg to 2nd Mrg lesion, 90% stenosed. PCI with Promus Premier DES 275 mm x 12 mm (2.6 mm) Post intervention, there is a 0% residual stenosis. 4. 3rd Mrg lesion, 80% stenosed. PCI with Promus Premier DES 2.75 mm x 24 mm overlapping the proximal circumflex stent (tapered 4.2-3.0 mm) Post intervention, there is a 0% residual stenosis. 5. Well-preserved LV function with no regional wall motion modalities   Severe bifurcation circumflex disease with proximal circumflex, OM 2 and OM 3  involvement. Successful difficult PCI involving proximal Circumflex-OM 2 and-OM 3 overlap segment. Rescue PCI of OM 2.  Plan:  Continue Integrilin for 2 more hours post PCI.  Convert from Plavix to [facility]  Expected discharge tomorrow.  If he has mild residual pain, would restart nitroglycerin infusion.  08/15/2015 relook cath  Patent stents in proximal Circumflex into OM3 as well as Ostial OM2.  Ost 1st Mrg to 1st Mrg lesion, 90% stenosed - the likely culprit lesion for his angina  Dist Cx lesion, 30% stenosed  Assessment and Plan  1. CAD/Chest pain - recent chest pain symptoms, mixed for possibly angina - EKG today SR, RBBB, , no acute ischemic changes.  - we will plan for an exericse nuclear stress test to further evaluate       Arnoldo Lenis, M.D.

## 2019-07-22 NOTE — Patient Instructions (Signed)
Medication Instructions:  Your physician recommends that you continue on your current medications as directed. Please refer to the Current Medication list given to you today.   Labwork: NONE  Testing/Procedures: Your physician has requested that you have en exercise stress myoview. For further information please visit [email or web address]. Please follow instruction sheet, as given.    Follow-Up: Your physician recommends that you schedule a follow-up appointment in: PENDING STRESS TEST RESULT    Any Other Special Instructions Will Be Listed Below (If Applicable).     If you need a refill on your cardiac medications before your next appointment, please call your pharmacy.

## 2019-08-03 ENCOUNTER — Encounter (HOSPITAL_COMMUNITY): Payer: Medicare Other

## 2019-08-03 ENCOUNTER — Encounter (HOSPITAL_COMMUNITY): Admission: RE | Admit: 2019-08-03 | Payer: Medicare Other | Source: Ambulatory Visit

## 2019-08-12 ENCOUNTER — Ambulatory Visit: Payer: Medicare Other | Admitting: Cardiology

## 2019-10-25 DIAGNOSIS — Z23 Encounter for immunization: Secondary | ICD-10-CM | POA: Diagnosis not present

## 2019-11-22 DIAGNOSIS — Z23 Encounter for immunization: Secondary | ICD-10-CM | POA: Diagnosis not present

## 2020-06-15 DIAGNOSIS — G4700 Insomnia, unspecified: Secondary | ICD-10-CM | POA: Diagnosis not present

## 2020-06-15 DIAGNOSIS — Z0000 Encounter for general adult medical examination without abnormal findings: Secondary | ICD-10-CM | POA: Diagnosis not present

## 2020-06-15 DIAGNOSIS — F419 Anxiety disorder, unspecified: Secondary | ICD-10-CM | POA: Diagnosis not present

## 2020-06-15 DIAGNOSIS — Z1211 Encounter for screening for malignant neoplasm of colon: Secondary | ICD-10-CM | POA: Diagnosis not present

## 2020-06-15 DIAGNOSIS — E782 Mixed hyperlipidemia: Secondary | ICD-10-CM | POA: Diagnosis not present

## 2020-11-02 ENCOUNTER — Other Ambulatory Visit: Payer: Self-pay | Admitting: Cardiology

## 2021-09-19 ENCOUNTER — Encounter: Payer: Self-pay | Admitting: Cardiology

## 2021-09-19 ENCOUNTER — Ambulatory Visit (INDEPENDENT_AMBULATORY_CARE_PROVIDER_SITE_OTHER): Payer: Medicare Other | Admitting: Cardiology

## 2021-09-19 ENCOUNTER — Other Ambulatory Visit: Payer: Self-pay

## 2021-09-19 VITALS — BP 134/76 | HR 97 | Ht 74.0 in | Wt 219.4 lb

## 2021-09-19 DIAGNOSIS — I4510 Unspecified right bundle-branch block: Secondary | ICD-10-CM | POA: Diagnosis not present

## 2021-09-19 DIAGNOSIS — I2510 Atherosclerotic heart disease of native coronary artery without angina pectoris: Secondary | ICD-10-CM | POA: Diagnosis not present

## 2021-09-19 MED ORDER — NITROGLYCERIN 0.4 MG SL SUBL: SUBLINGUAL_TABLET | SUBLINGUAL | 3 refills | Status: DC

## 2021-09-19 MED ORDER — LOSARTAN POTASSIUM 25 MG PO TABS: 12.5000 mg | ORAL_TABLET | Freq: Every day | ORAL | 3 refills | Status: DC

## 2021-09-19 MED ORDER — METOPROLOL TARTRATE 25 MG PO TABS: 25.0000 mg | ORAL_TABLET | Freq: Two times a day (BID) | ORAL | 3 refills | Status: DC

## 2021-09-19 MED ORDER — ATORVASTATIN CALCIUM 80 MG PO TABS: 80.0000 mg | ORAL_TABLET | Freq: Every day | ORAL | 3 refills | Status: DC

## 2021-09-19 NOTE — Patient Instructions (Signed)

## 2021-09-19 NOTE — Progress Notes (Signed)
Clinical Summary Erik Page is a 70 y.o.male seen today for follow up of the following medical problems.      1. CAD - admit 07/2015 with NSTEMI. Underwent PCI of prox LCX, OM2, and OM3 due to complex bifurcation lesion (3 DES stents total).   - postcath had recurrent chest pain, had relook cath which was stable.    07/2015 echo: LVEF 55-60%, hypokinesis of lateral and inferolateral wall.   - 11/2015 nuclear stress test with evidence of scar, no current ischemia - we changed lisinopril to losartan due to cough     - no chest pains. 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   Past Medical History:  Diagnosis Date   HTN (hypertension)      No Known Allergies   Current Outpatient Medications  Medication Sig Dispense Refill   aspirin EC 81 MG EC tablet Take 1 tablet (81 mg total) by mouth daily.     atorvastatin (LIPITOR) 80 MG tablet TAKE ONE TABLET BY MOUTH DAILY  **MUST CALL MD FOR APPOINTMENT 3 tablet 0   losartan (COZAAR) 25 MG tablet TAKE ONE-HALF TABLET BY MOUTH DAILY ( NEEDS TO BE SEEN) 2 tablet 0   metoprolol tartrate (LOPRESSOR) 25 MG tablet Take 1 tablet (25 mg total) by mouth 2 (two) times daily. 6 tablet 0   nitroGLYCERIN (NITROSTAT) 0.4 MG SL tablet DISSOLVE 1 TAB UNDER TONGUE FOR CHEST PAIN - IF PAIN REMAINS AFTER 5 MIN, CALL 911 AND REPEAT DOSE. MAX 3 TABS IN 15 MINUTES 25 tablet 3   No current facility-administered medications for this visit.     Past Surgical History:  Procedure Laterality Date   CARDIAC CATHETERIZATION N/A 08/15/2015   Procedure: Left Heart Cath and Coronary Angiography;  Surgeon: Leonie Man, MD;  Location: [facility];  Service: Cardiovascular;  Laterality: N/A;   CARDIAC CATHETERIZATION N/A 08/15/2015   Procedure: Left Heart Cath and Coronary Angiography;  Surgeon: Leonie Man, MD;  Location: [facility];  Service: Cardiovascular;  Laterality: N/A;   CARDIAC CATHETERIZATION N/A  08/15/2015   Procedure: Coronary Stent Intervention;  Surgeon: Leonie Man, MD;  Location: [facility];  Service: Cardiovascular;  Laterality: N/A;   TONSILLECTOMY       No Known Allergies    Family History  Problem Relation Age of Onset   Hypertension Other      Social History Mr. Gloss reports that he has never smoked. He has never used smokeless tobacco. Mr. Waddell reports no history of alcohol use.   Review of Systems CONSTITUTIONAL: No weight loss, fever, chills, weakness or fatigue.  HEENT: Eyes: No visual loss, blurred vision, double vision or yellow sclerae.No hearing loss, sneezing, congestion, runny nose or sore throat.  SKIN: No rash or itching.  CARDIOVASCULAR: per hpi RESPIRATORY: No shortness of breath, cough or sputum.  GASTROINTESTINAL: No anorexia, nausea, vomiting or diarrhea. No abdominal pain or blood.  GENITOURINARY: No burning on urination, no polyuria NEUROLOGICAL: No headache, dizziness, syncope, paralysis, ataxia, numbness or tingling in the extremities. No change in bowel or bladder control.  MUSCULOSKELETAL: No muscle, back pain, joint pain or stiffness.  LYMPHATICS: No enlarged nodes. No history of splenectomy.  PSYCHIATRIC: No history of depression or anxiety.  ENDOCRINOLOGIC: No reports of sweating, cold or heat intolerance. No polyuria or polydipsia.  Marland Kitchen   Physical Examination Today's Vitals   09/19/21 1446  BP: 134/76  Pulse: 97  SpO2: 98%  Weight: 219 lb 6.4 oz (99.5 kg)  Height: 6\' 2"  (1.88 m)   Body mass index is 28.17 kg/m.  Gen: resting comfortably, no acute distress HEENT: no scleral icterus, pupils equal round and reactive, no palptable cervical adenopathy,  CV: RRR, no m/r/g no jvd Resp: Clear to auscultation bilaterally GI: abdomen is soft, non-tender, non-distended, normal bowel sounds, no hepatosplenomegaly MSK: extremities are warm, no edema.  Skin: warm, no rash Neuro:  no focal deficits Psych:  appropriate affect   Diagnostic Studies 11/2015 nuclear stress There was no ST segment deviation noted during stress. Findings consistent with prior myocardial infarction. This is an intermediate risk study. The left ventricular ejection fraction is moderately decreased (30-44%).     07/2015 echo Study Conclusions   - Left ventricle: The cavity size was normal. Systolic function was   normal. The estimated ejection fraction was in the range of 55%   to 60%. Hypokinesis of the lateral and inferolateral myocardium. - Atrial septum: A patent foramen ovale cannot be excluded.     08/15/15 initial cath Prox Cx to Mid Cx lesion, 99% stenosed. PCI with Promus Premier DES or 4.0 mm x 16 mm DES (4.6 mm). Post intervention, there is a 0% residual stenosis. Ost 1st Mrg to 1st Mrg lesion, 90% stenosed. Too small for PCI. Ost 2nd Mrg to 2nd Mrg lesion, 90% stenosed. PCI with Promus Premier DES 275 mm x 12 mm (2.6 mm) Post intervention, there is a 0% residual stenosis. 3rd Mrg lesion, 80% stenosed. PCI with Promus Premier DES 2.75 mm x 24 mm overlapping the proximal circumflex stent (tapered 4.2-3.0 mm) Post intervention, there is a 0% residual stenosis. Well-preserved LV function with no regional wall motion modalities     Severe bifurcation circumflex disease with proximal circumflex, OM 2 and OM 3 involvement. Successful difficult PCI involving proximal Circumflex-OM 2 and-OM 3 overlap segment. Rescue PCI of OM 2.   Plan: Continue Integrilin for 2 more hours post PCI. Convert from Plavix to [facility] Expected discharge tomorrow. If he has mild residual pain, would restart nitroglycerin infusion.   08/15/2015 relook cath Patent stents in proximal Circumflex into OM3 as well as Ostial OM2. Ost 1st Mrg to 1st Mrg lesion, 90% stenosed - the likely culprit lesion for his angina Dist Cx lesion, 30% stenosed    Assessment and Plan  1. CAD - no recent symptoms - continue current meds -  request labs from pcp - EKG today shows SR, chronic RBBB/LAFB   F/u 1 year.       Arnoldo Lenis, M.D.

## 2021-09-19 NOTE — Addendum Note (Signed)
Addended by: Laurine Blazer on: 09/19/2021 03:25 PM   Modules accepted: Orders

## 2021-09-28 ENCOUNTER — Emergency Department (HOSPITAL_COMMUNITY): Payer: Medicare Other

## 2021-09-28 ENCOUNTER — Encounter (HOSPITAL_COMMUNITY): Payer: Self-pay

## 2021-09-28 ENCOUNTER — Other Ambulatory Visit: Payer: Self-pay

## 2021-09-28 ENCOUNTER — Inpatient Hospital Stay (HOSPITAL_COMMUNITY)
Admission: EM | Admit: 2021-09-28 | Discharge: 2021-10-02 | DRG: 988 | Disposition: A | Discharge status: Home / Self Care | Payer: Medicare Other | Attending: Family Medicine | Admitting: Family Medicine

## 2021-09-28 DIAGNOSIS — Z79899 Other long term (current) drug therapy: Secondary | ICD-10-CM

## 2021-09-28 DIAGNOSIS — N400 Enlarged prostate without lower urinary tract symptoms: Secondary | ICD-10-CM | POA: Diagnosis present

## 2021-09-28 DIAGNOSIS — G893 Neoplasm related pain (acute) (chronic): Secondary | ICD-10-CM | POA: Diagnosis present

## 2021-09-28 DIAGNOSIS — Z7982 Long term (current) use of aspirin: Secondary | ICD-10-CM

## 2021-09-28 DIAGNOSIS — N179 Acute kidney failure, unspecified: Secondary | ICD-10-CM | POA: Diagnosis present

## 2021-09-28 DIAGNOSIS — Z955 Presence of coronary angioplasty implant and graft: Secondary | ICD-10-CM | POA: Diagnosis not present

## 2021-09-28 DIAGNOSIS — N1339 Other hydronephrosis: Secondary | ICD-10-CM | POA: Diagnosis not present

## 2021-09-28 DIAGNOSIS — I252 Old myocardial infarction: Secondary | ICD-10-CM

## 2021-09-28 DIAGNOSIS — R590 Localized enlarged lymph nodes: Secondary | ICD-10-CM | POA: Diagnosis present

## 2021-09-28 DIAGNOSIS — I2510 Atherosclerotic heart disease of native coronary artery without angina pectoris: Secondary | ICD-10-CM | POA: Diagnosis present

## 2021-09-28 DIAGNOSIS — E785 Hyperlipidemia, unspecified: Secondary | ICD-10-CM | POA: Diagnosis present

## 2021-09-28 DIAGNOSIS — D630 Anemia in neoplastic disease: Secondary | ICD-10-CM | POA: Diagnosis present

## 2021-09-28 DIAGNOSIS — C7951 Secondary malignant neoplasm of bone: Secondary | ICD-10-CM | POA: Diagnosis present

## 2021-09-28 DIAGNOSIS — Z8249 Family history of ischemic heart disease and other diseases of the circulatory system: Secondary | ICD-10-CM

## 2021-09-28 DIAGNOSIS — I25110 Atherosclerotic heart disease of native coronary artery with unstable angina pectoris: Secondary | ICD-10-CM | POA: Diagnosis present

## 2021-09-28 DIAGNOSIS — Z20822 Contact with and (suspected) exposure to covid-19: Secondary | ICD-10-CM | POA: Diagnosis present

## 2021-09-28 DIAGNOSIS — I700 Atherosclerosis of aorta: Secondary | ICD-10-CM | POA: Diagnosis present

## 2021-09-28 DIAGNOSIS — K644 Residual hemorrhoidal skin tags: Secondary | ICD-10-CM | POA: Diagnosis present

## 2021-09-28 DIAGNOSIS — I129 Hypertensive chronic kidney disease with stage 1 through stage 4 chronic kidney disease, or unspecified chronic kidney disease: Secondary | ICD-10-CM | POA: Diagnosis present

## 2021-09-28 DIAGNOSIS — N181 Chronic kidney disease, stage 1: Secondary | ICD-10-CM | POA: Diagnosis present

## 2021-09-28 DIAGNOSIS — Z66 Do not resuscitate: Secondary | ICD-10-CM | POA: Diagnosis present

## 2021-09-28 DIAGNOSIS — D649 Anemia, unspecified: Secondary | ICD-10-CM | POA: Diagnosis present

## 2021-09-28 DIAGNOSIS — R591 Generalized enlarged lymph nodes: Secondary | ICD-10-CM

## 2021-09-28 DIAGNOSIS — C61 Malignant neoplasm of prostate: Secondary | ICD-10-CM | POA: Diagnosis present

## 2021-09-28 DIAGNOSIS — M48061 Spinal stenosis, lumbar region without neurogenic claudication: Secondary | ICD-10-CM | POA: Diagnosis present

## 2021-09-28 DIAGNOSIS — N1330 Unspecified hydronephrosis: Secondary | ICD-10-CM

## 2021-09-28 DIAGNOSIS — N131 Hydronephrosis with ureteral stricture, not elsewhere classified: Secondary | ICD-10-CM | POA: Diagnosis present

## 2021-09-28 DIAGNOSIS — I10 Essential (primary) hypertension: Secondary | ICD-10-CM | POA: Diagnosis present

## 2021-09-28 HISTORY — DX: Acute myocardial infarction, unspecified: I21.9

## 2021-09-28 LAB — CBC WITH DIFFERENTIAL/PLATELET
Abs Immature Granulocytes: 0.11 10*3/uL — ABNORMAL HIGH (ref 0.00–0.07)
Basophils Absolute: 0 10*3/uL (ref 0.0–0.1)
Basophils Relative: 1 %
Eosinophils Absolute: 0 10*3/uL (ref 0.0–0.5)
Eosinophils Relative: 1 %
HCT: 23.9 % — ABNORMAL LOW (ref 39.0–52.0)
Hemoglobin: 7.7 g/dL — ABNORMAL LOW (ref 13.0–17.0)
Immature Granulocytes: 2 %
Lymphocytes Relative: 22 %
Lymphs Abs: 1.2 10*3/uL (ref 0.7–4.0)
MCH: 29.6 pg (ref 26.0–34.0)
MCHC: 32.2 g/dL (ref 30.0–36.0)
MCV: 91.9 fL (ref 80.0–100.0)
Monocytes Absolute: 0.6 10*3/uL (ref 0.1–1.0)
Monocytes Relative: 11 %
Neutro Abs: 3.4 10*3/uL (ref 1.7–7.7)
Neutrophils Relative %: 63 %
Platelets: 154 10*3/uL (ref 150–400)
RBC: 2.6 MIL/uL — ABNORMAL LOW (ref 4.22–5.81)
RDW: 15.7 % — ABNORMAL HIGH (ref 11.5–15.5)
WBC: 5.3 10*3/uL (ref 4.0–10.5)
nRBC: 2.5 % — ABNORMAL HIGH (ref 0.0–0.2)

## 2021-09-28 LAB — HEPATIC FUNCTION PANEL
ALT: 20 U/L (ref 0–44)
AST: 29 U/L (ref 15–41)
Albumin: 2.9 g/dL — ABNORMAL LOW (ref 3.5–5.0)
Alkaline Phosphatase: 358 U/L — ABNORMAL HIGH (ref 38–126)
Bilirubin, Direct: 0.3 mg/dL — ABNORMAL HIGH (ref 0.0–0.2)
Indirect Bilirubin: 1.5 mg/dL — ABNORMAL HIGH (ref 0.3–0.9)
Total Bilirubin: 1.8 mg/dL — ABNORMAL HIGH (ref 0.3–1.2)
Total Protein: 7.2 g/dL (ref 6.5–8.1)

## 2021-09-28 LAB — RETICULOCYTES
Immature Retic Fract: 31.4 % — ABNORMAL HIGH (ref 2.3–15.9)
RBC.: 3.8 MIL/uL — ABNORMAL LOW (ref 4.22–5.81)
Retic Count, Absolute: 71.1 10*3/uL (ref 19.0–186.0)
Retic Ct Pct: 1.9 % (ref 0.4–3.1)

## 2021-09-28 LAB — BASIC METABOLIC PANEL
Anion gap: 10 (ref 5–15)
BUN: 18 mg/dL (ref 8–23)
CO2: 22 mmol/L (ref 22–32)
Calcium: 8.2 mg/dL — ABNORMAL LOW (ref 8.9–10.3)
Chloride: 104 mmol/L (ref 98–111)
Creatinine, Ser: 1.24 mg/dL (ref 0.61–1.24)
GFR, Estimated: >60 mL/min (ref >60)
Glucose, Bld: 113 mg/dL — ABNORMAL HIGH (ref 70–99)
Potassium: 4.1 mmol/L (ref 3.5–5.1)
Sodium: 136 mmol/L (ref 135–145)

## 2021-09-28 LAB — FERRITIN: Ferritin: 3016 ng/mL — ABNORMAL HIGH (ref 24–336)

## 2021-09-28 LAB — RESP PANEL BY RT-PCR (FLU A&B, COVID) ARPGX2
Influenza A by PCR: NEGATIVE
Influenza B by PCR: NEGATIVE
SARS Coronavirus 2 by RT PCR: NEGATIVE

## 2021-09-28 LAB — IRON AND TIBC
Iron: 32 ug/dL — ABNORMAL LOW (ref 45–182)
Saturation Ratios: 25 % (ref 17.9–39.5)
TIBC: 130 ug/dL — ABNORMAL LOW (ref 250–450)
UIBC: 98 ug/dL

## 2021-09-28 LAB — URINALYSIS, ROUTINE W REFLEX MICROSCOPIC
Bilirubin Urine: NEGATIVE
Glucose, UA: NEGATIVE mg/dL
Hgb urine dipstick: NEGATIVE
Ketones, ur: 5 mg/dL — AB
Leukocytes,Ua: NEGATIVE
Nitrite: NEGATIVE
Protein, ur: NEGATIVE mg/dL
Specific Gravity, Urine: 1.029 (ref 1.005–1.030)
pH: 7 (ref 5.0–8.0)

## 2021-09-28 LAB — VITAMIN B12: Vitamin B-12: 406 pg/mL (ref 180–914)

## 2021-09-28 LAB — LACTIC ACID, PLASMA
Lactic Acid, Venous: 2.1 mmol/L (ref 0.5–1.9)
Lactic Acid, Venous: 0.9 mmol/L (ref 0.5–1.9)

## 2021-09-28 LAB — POC OCCULT BLOOD, ED: Fecal Occult Bld: POSITIVE — AB

## 2021-09-28 LAB — BRAIN NATRIURETIC PEPTIDE: B Natriuretic Peptide: 332.3 pg/mL — ABNORMAL HIGH (ref 0.0–100.0)

## 2021-09-28 LAB — FOLATE: Folate: 6.1 ng/mL (ref >5.9)

## 2021-09-28 LAB — LIPASE, BLOOD: Lipase: 26 U/L (ref 11–51)

## 2021-09-28 IMAGING — CR DG CHEST 2V
2 series · 2 of 2 positions shown · non-contrast
Comparison: None.

CLINICAL DATA: shortness of breath

EXAM:
CHEST - 2 VIEW

[w chest pa]
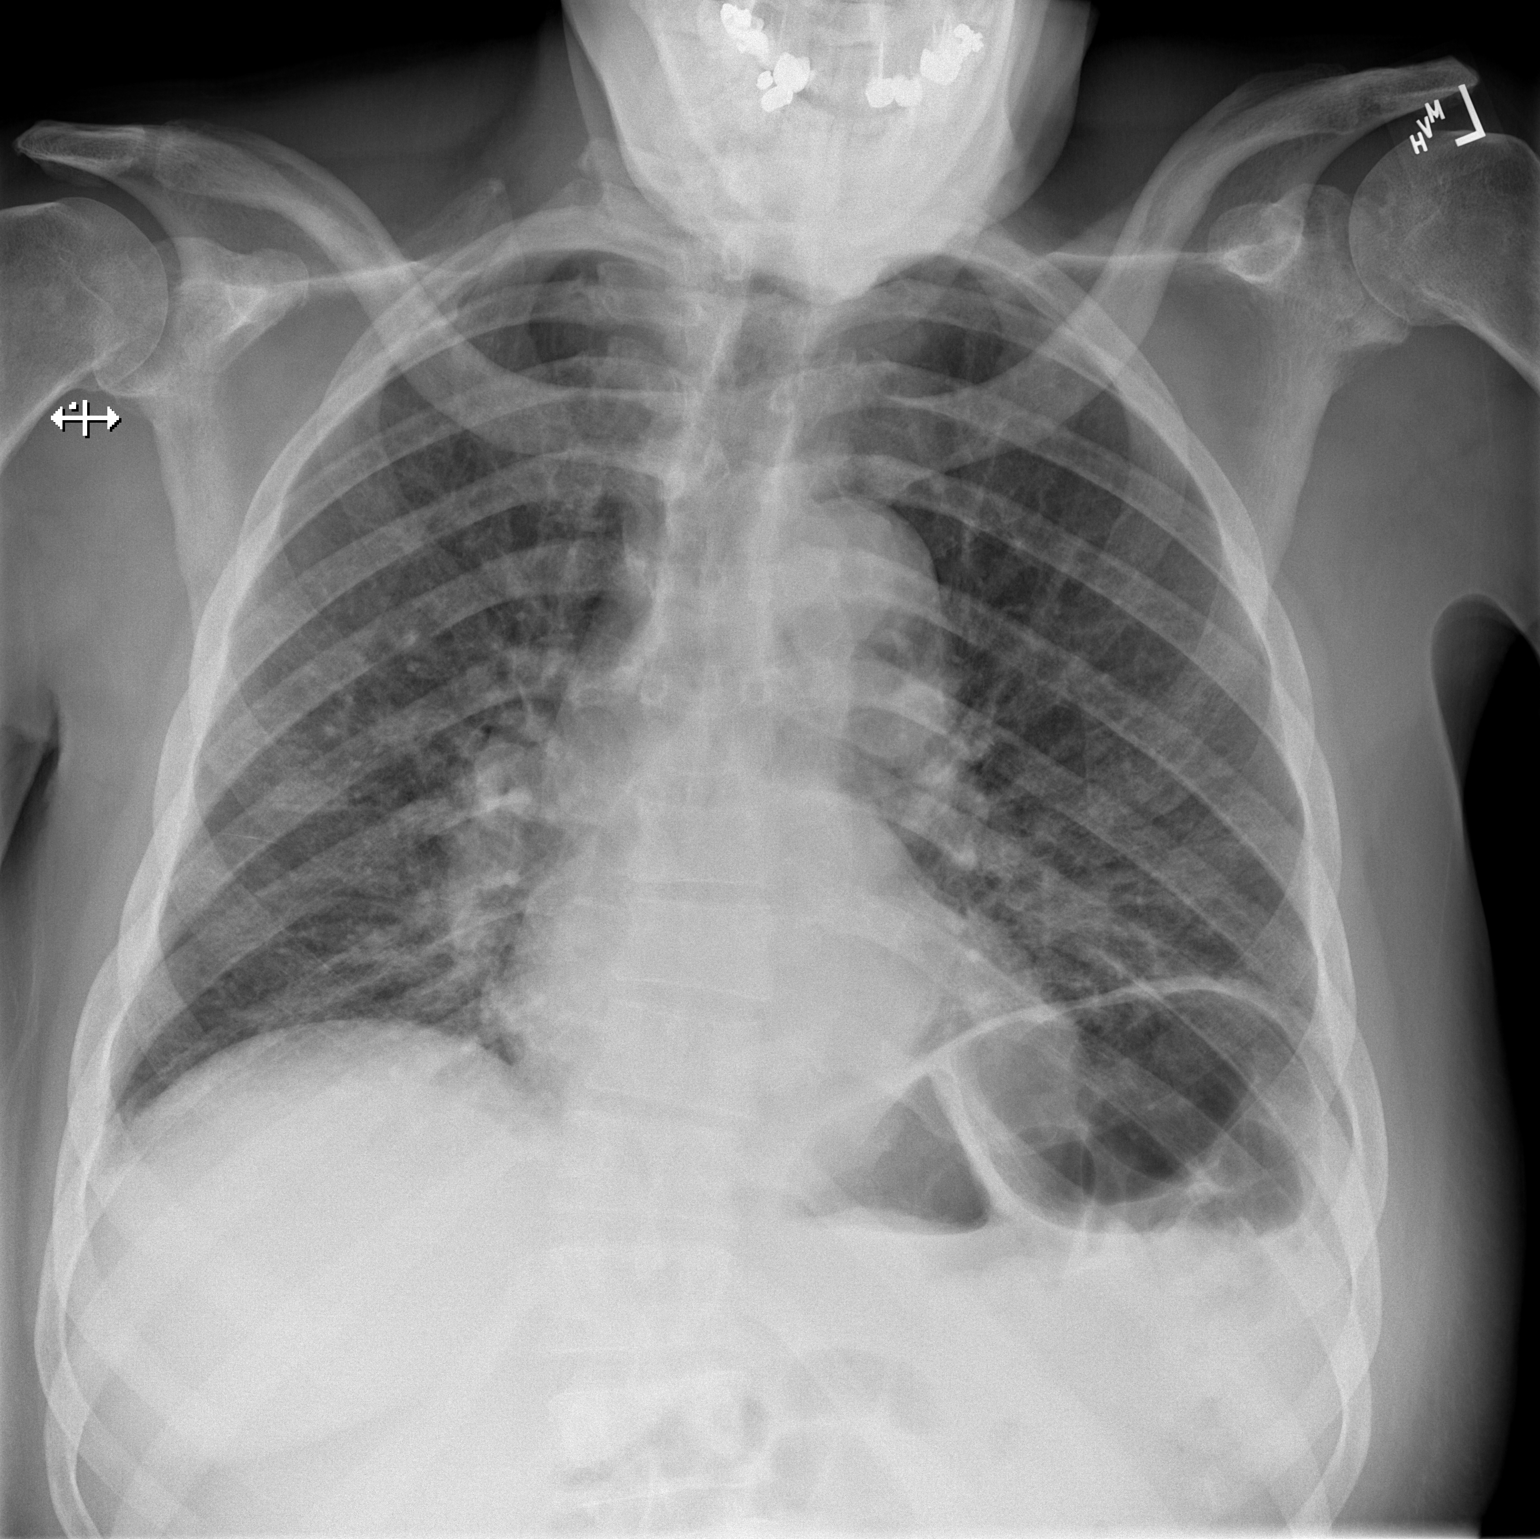

[w chest lat]
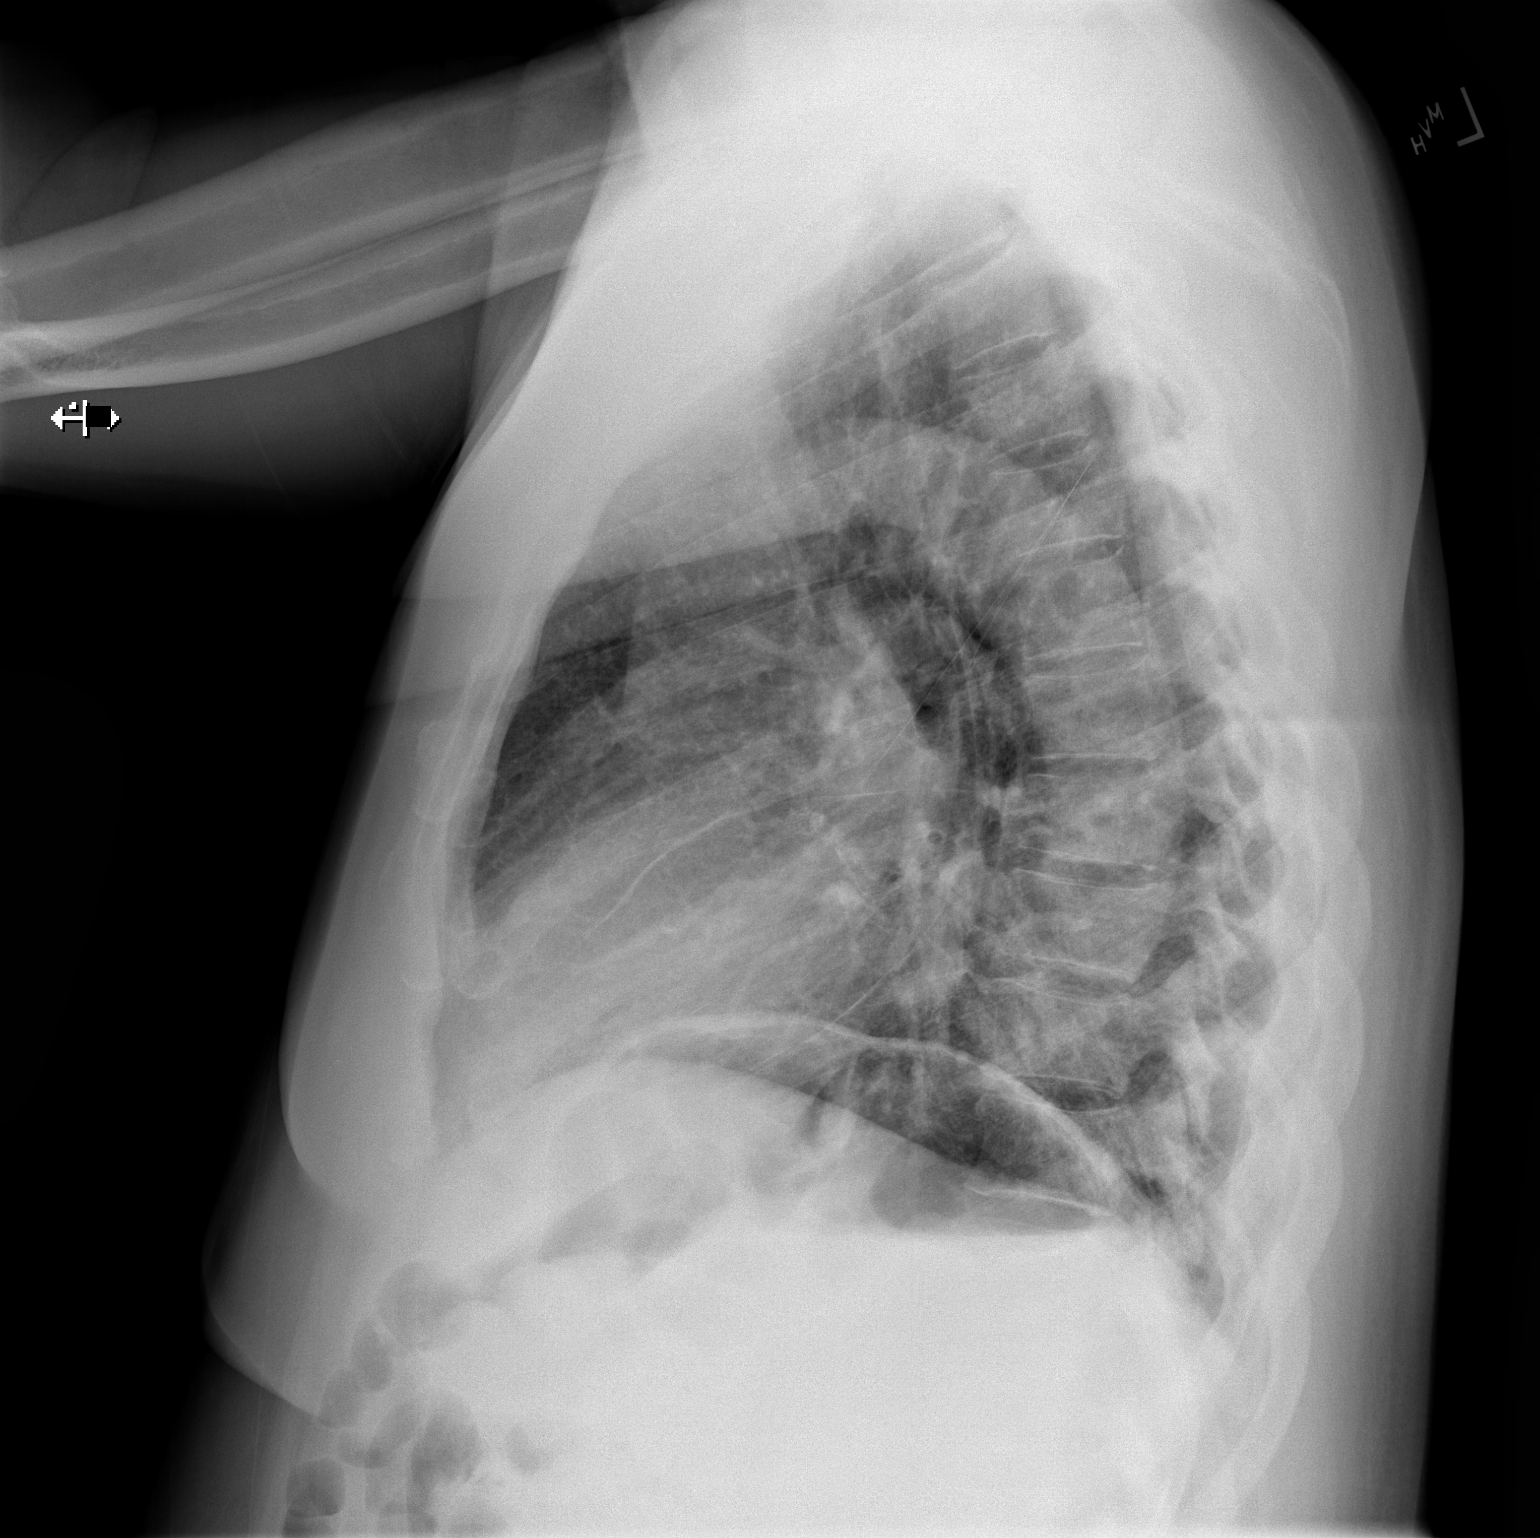

[2 of 2 positions shown; findings below may reference images not displayed]

FINDINGS: The heart and mediastinal contours are within normal limits.

Elevated left hemidiaphragm. No focal consolidation. Increased
interstitial markings. No pleural effusion. No pneumothorax.

No acute osseous abnormality.
IMPRESSION: Increased interstitial markings. Query underlying
infection/inflammation.

## 2021-09-28 IMAGING — CT CT ABD-PELV W/ CM
2 of 5 series · 14 of 46 positions shown, 16 images · IV contrast (omnipaque)
Comparison: None.

CLINICAL DATA: Evaluate for bowel perforation. Abdominal pain,
nonlocalized. Complains of weakness and rectal bleeding.

EXAM:
CT ABDOMEN AND PELVIS WITH CONTRAST
TECHNIQUE: Multidetector CT imaging of the abdomen and pelvis was performed
using the standard protocol following bolus administration of
intravenous contrast.
CONTRAST:  80mL OMNIPAQUE IOHEXOL 350 MG/ML SOLN

[Series 2: axial st · axial · 0.82mm/px · z∈[-437,-37]mm · 11 of 94 slices shown, 13 images]
[im 7/94  soft-tissue]
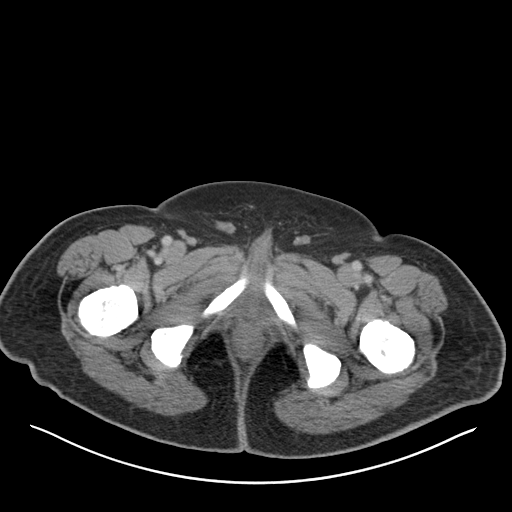
[im 7/94  bone]
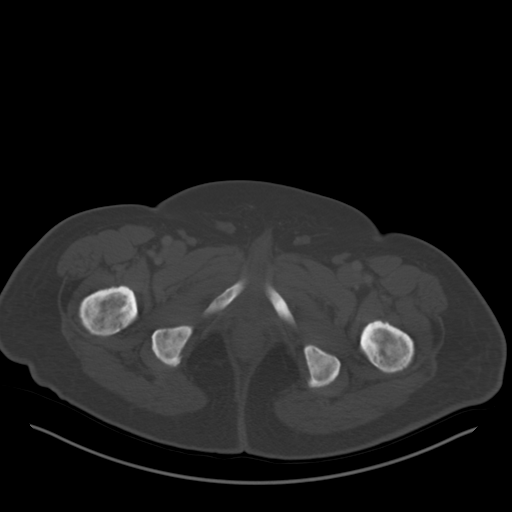
[im 14/94  soft-tissue]
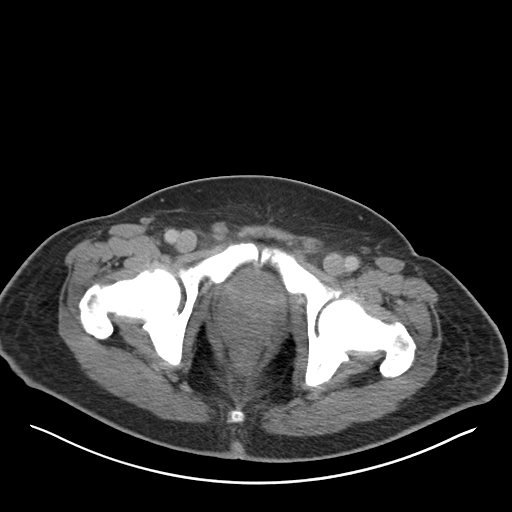
[im 20/94  soft-tissue]
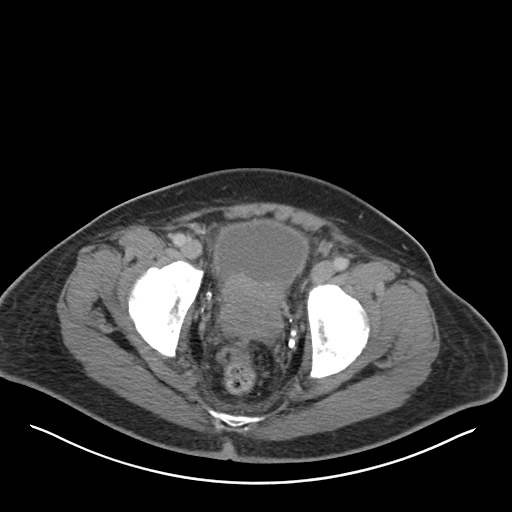
[im 34/94  soft-tissue]
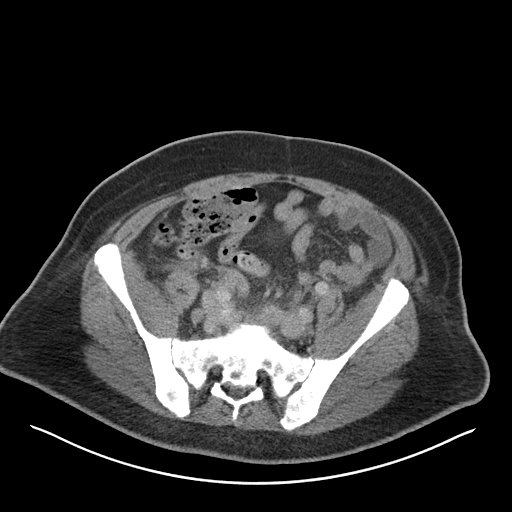
[im 40/94  soft-tissue]
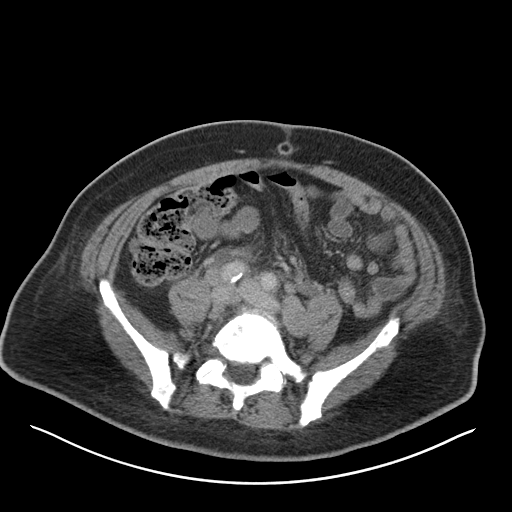
[im 47/94  soft-tissue]
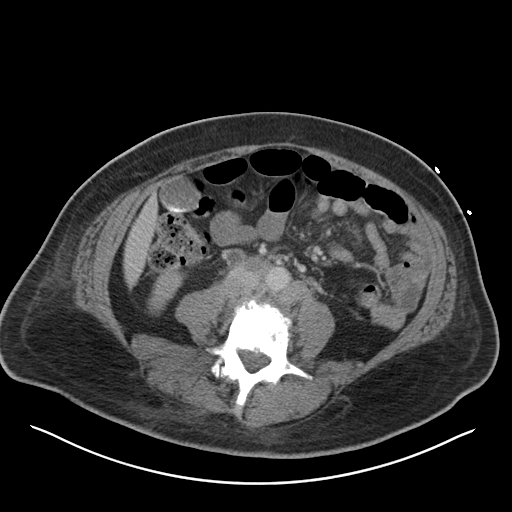
[im 54/94  soft-tissue]
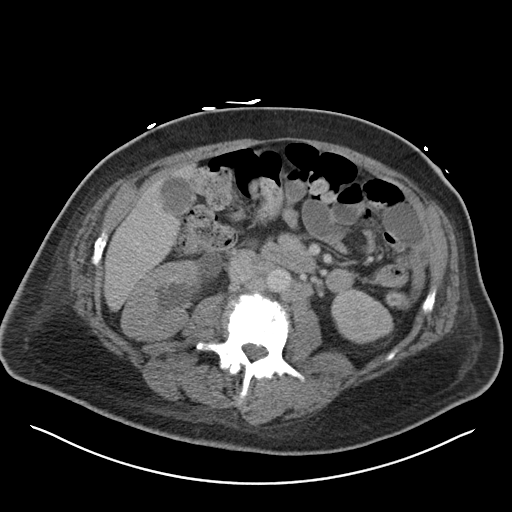
[im 60/94  soft-tissue]
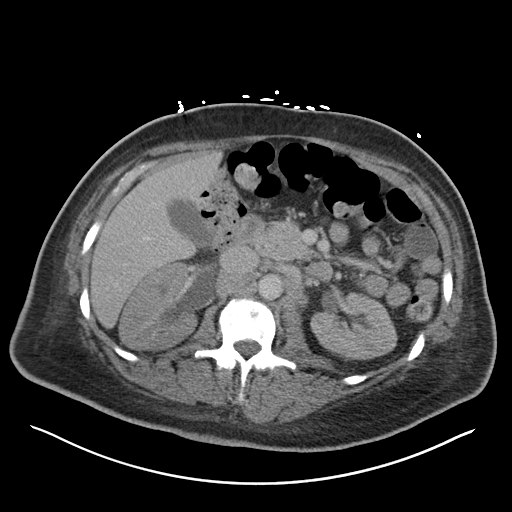
[im 74/94  soft-tissue]
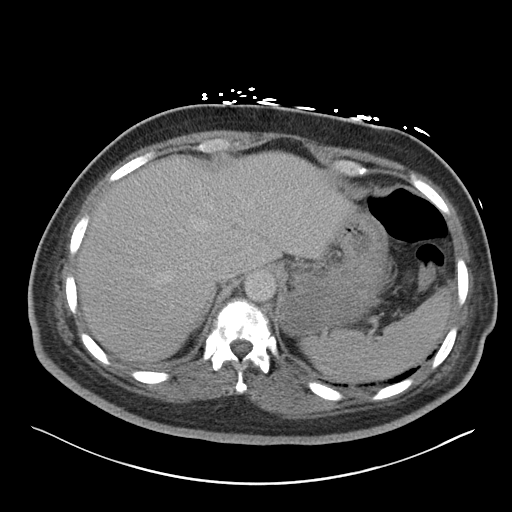
[im 74/94  bone]
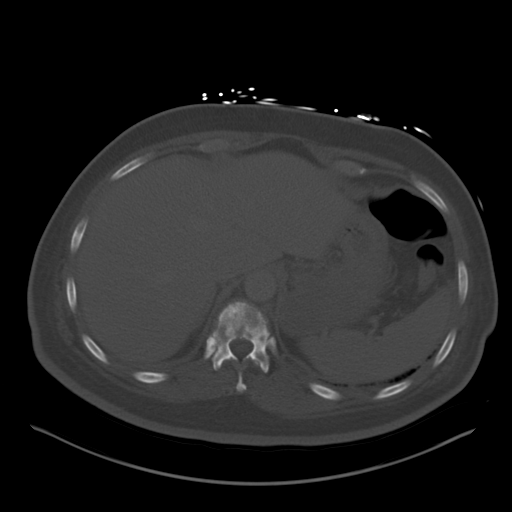
[im 80/94  soft-tissue]
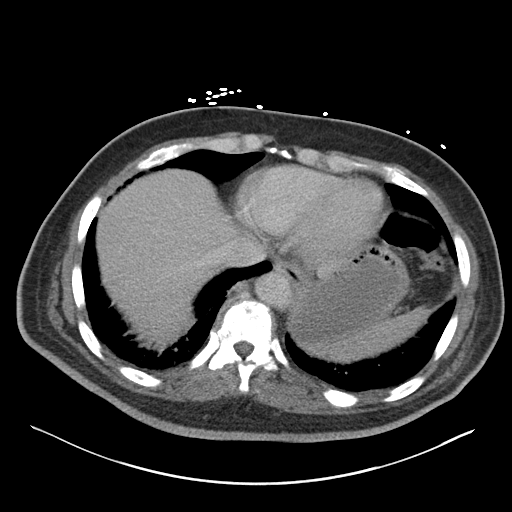
[im 87/94  soft-tissue]
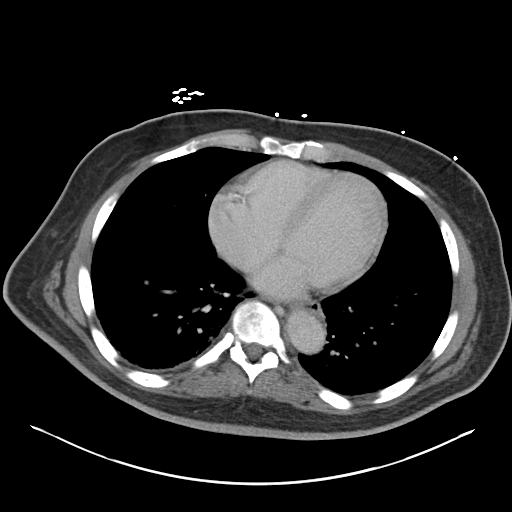

[Series 5: coronal st · coronal · 0.72mm/px · 3 of 151 slices shown]
[im 51/151  soft-tissue]
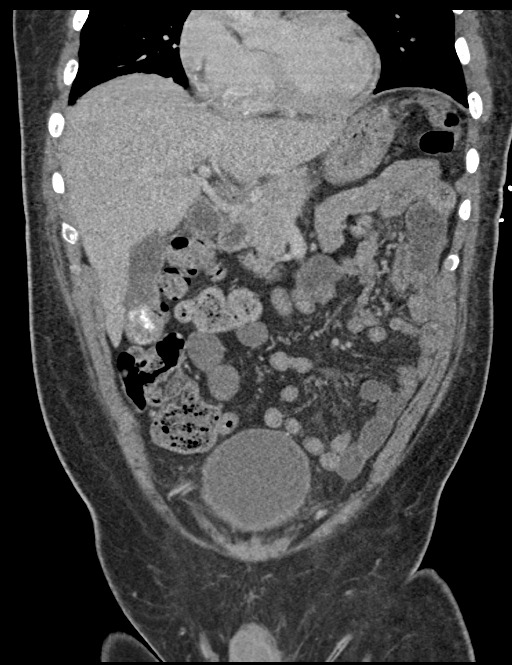
[im 67/151  soft-tissue]
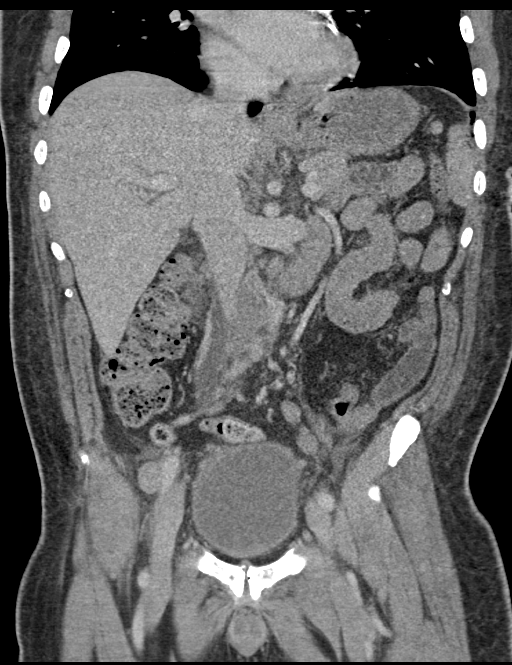
[im 84/151  soft-tissue]
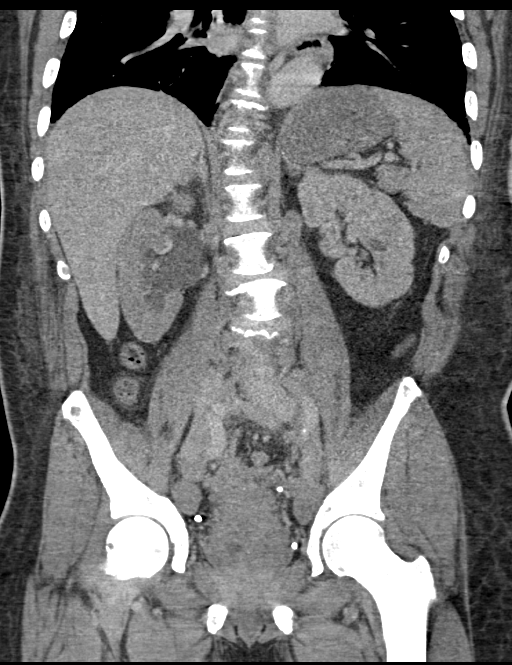

[14 of 46 positions shown; findings below may reference images not displayed]

FINDINGS: Lower chest: No acute abnormality.

Hepatobiliary: No focal liver abnormality is seen. Tiny stones are
noted layering within the dependent portion of the gallbladder
measuring up to 3 mm. No signs of gallbladder wall inflammation or
bile duct dilatation.

Pancreas: Unremarkable. No pancreatic ductal dilatation or
surrounding inflammatory changes.

Spleen: There is a small linear hypodensity within the posterior
spleen measuring 1.3 cm. This is of uncertain clinical significance.
Signs of adjacent rib fractures are identified which appears
subacute to chronic.

Adrenals/Urinary Tract: The adrenal glands are normal. Small left
kidney cysts are identified. Left kidney is otherwise unremarkable.
The right kidney appears diffusely edematous and there is severe
right hydronephrosis and hydroureter.

Markedly enlarged prostate gland is identified with mass effect upon
the base of bladder with suspected tumor infiltration along the
posterior wall of bladder, image 91/6.

Stomach/Bowel: Stomach appears normal. The appendix is visualized
and appears normal. No bowel wall thickening, inflammation, or
distension.

Vascular/Lymphatic: Aortic atherosclerosis without aneurysm.
Extensive bilateral retroperitoneal and bilateral pelvic adenopathy
identified compatible with metastatic disease. Index retrocaval
lymph node measures 1.7 cm, image 35/2. Index left retroperitoneal
lymph node measures 2.2 cm, image 38/2. Left common iliac lymph node
is enlarged measuring 2 cm, image 55/2. Right pelvic sidewall lymph
node measures 2.2 cm, image 71/2. Left pelvic sidewall lymph node
measures 1.8 cm, image 72/2.

Reproductive: The prostate gland is enlarged. The margins of the
superior portions of the prostate gland are irregular with nodular
soft tissue infiltration into the surrounding periprosthetic fat.
Findings are worrisome for prostate cancer.

Other: No free fluid or fluid collections identified within the
abdomen or pelvis.

Musculoskeletal: Widespread sclerotic metastases are identified
throughout the visualized axial and appendicular skeleton.
Degenerative disc disease noted within the lumbar spine.
IMPRESSION: 1. Enlarged prostate gland with nodular soft tissue infiltration
into the surrounding periprosthetic fat. Signs of bladder wall
involvement are identified. Findings are worrisome for prostate
cancer with tumor extension into the surrounding soft tissues,
diffuse nodal metastasis, and widespread osseous metastatic disease.
2. The right kidney is markedly edematous with signs of severe
hydronephrosis and hydroureter. The distal right ureter is likely
obstructed by either pelvic adenopathy or tumor invasion at the
right UVJ.
3. Gallstones.
4. There is a linear hypodensity within the inferior spleen.
Although no secondary signs of acute splenic injury there is
adjacent subacute to chronic left rib fractures. Findings may
reflect sequelae of old splenic injury. Correlate for any clinical
history of trauma to the left upper quadrant abdominal wall.
5. Aortic Atherosclerosis ([4R]-[4R]).

## 2021-09-28 IMAGING — CT CT L SPINE W/O CM
3 series · 9 of 33 positions shown, 11 images · non-contrast
Comparison: None.

CLINICAL DATA: Weakness, rectal bleeding, nausea, lumbar pain

EXAM:
CT LUMBAR SPINE WITHOUT CONTRAST
TECHNIQUE: Multidetector CT imaging of the lumbar spine was performed without
intravenous contrast administration. Multiplanar CT image
reconstructions were also generated.

[Series 2: axial st · axial · 0.35mm/px · z∈[-224,-224]mm · 1 of 137 slices shown, 2 images]
[im 74/137  soft-tissue]
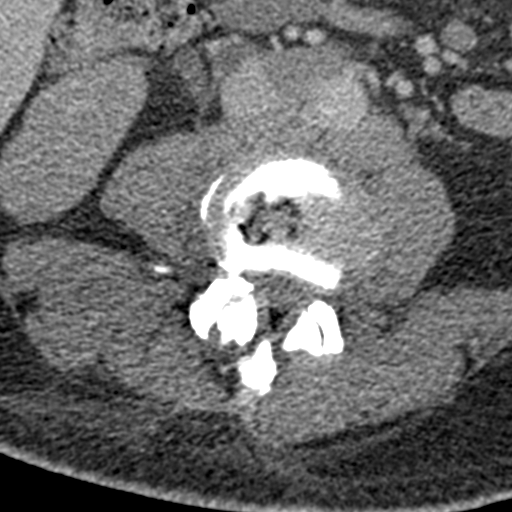
[im 74/137  bone]
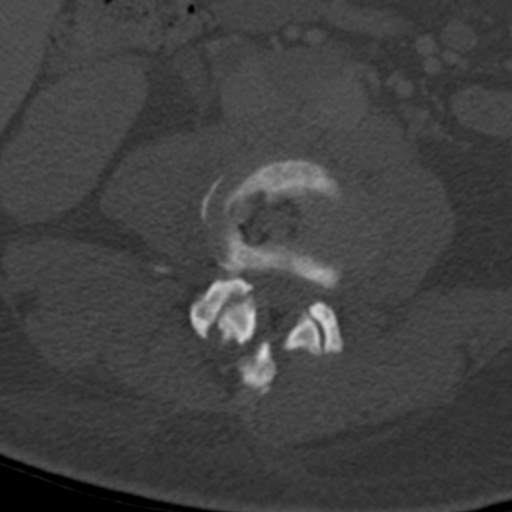

[Series 4: cor bone · coronal · 0.29mm/px · 3 of 69 slices shown]
[im 14/69  bone]
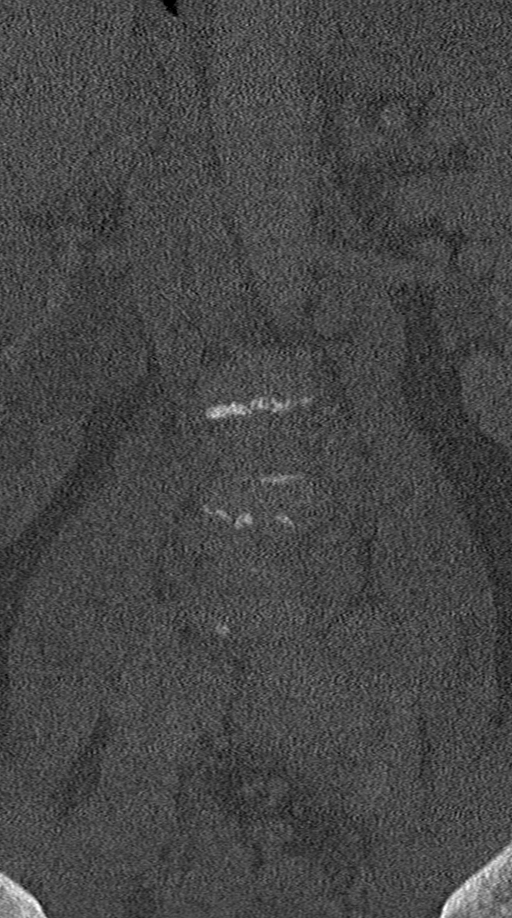
[im 28/69  bone]
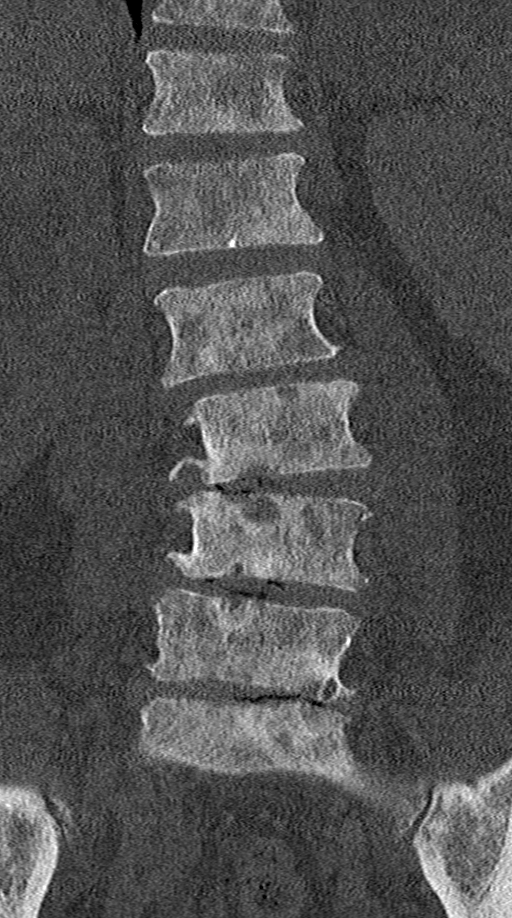
[im 41/69  bone]
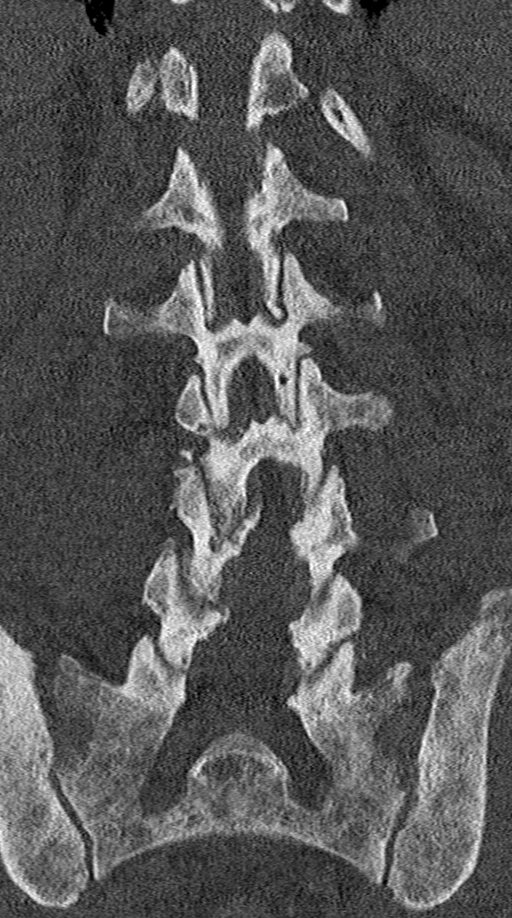

[Series 5: sag bone · sagittal · 0.27mm/px · 5 of 76 slices shown, 6 images]
[im 26/76  bone]
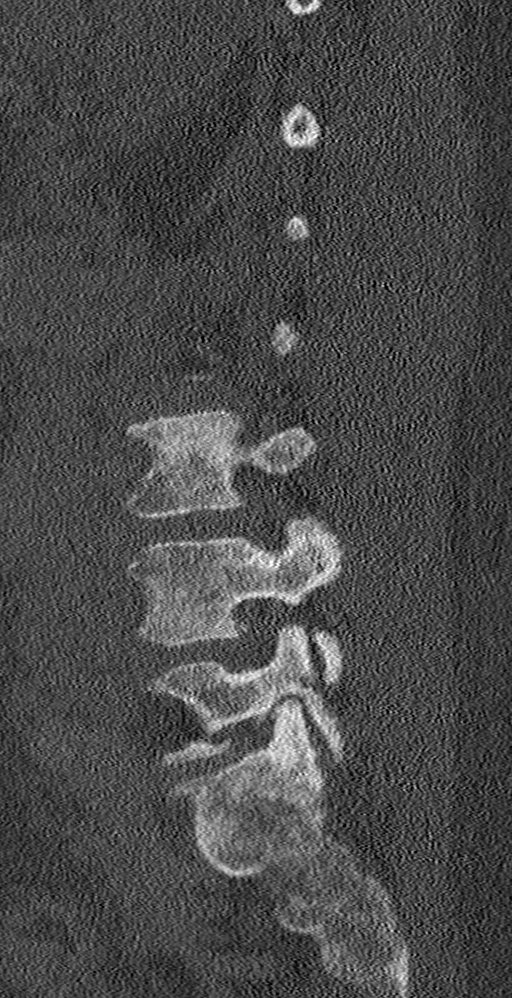
[im 32/76  bone]
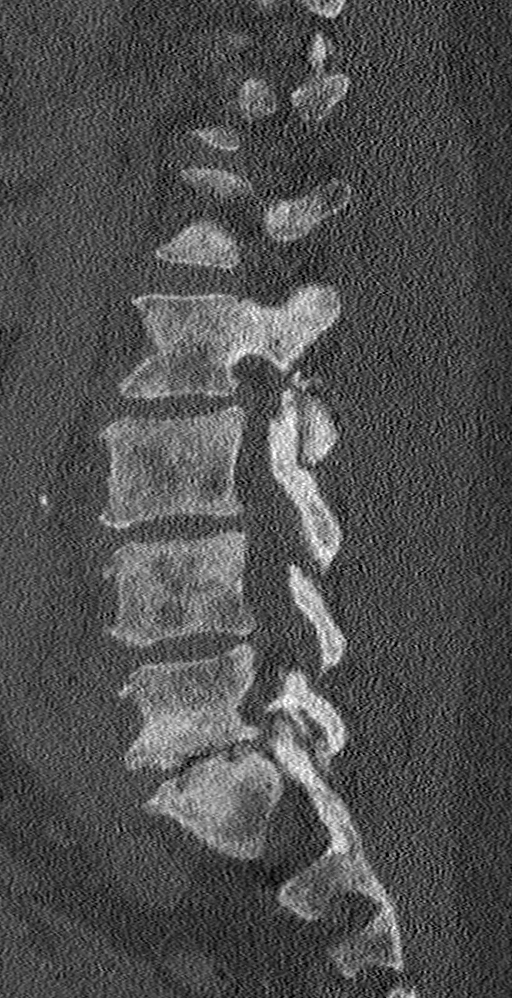
[im 38/76  soft-tissue]
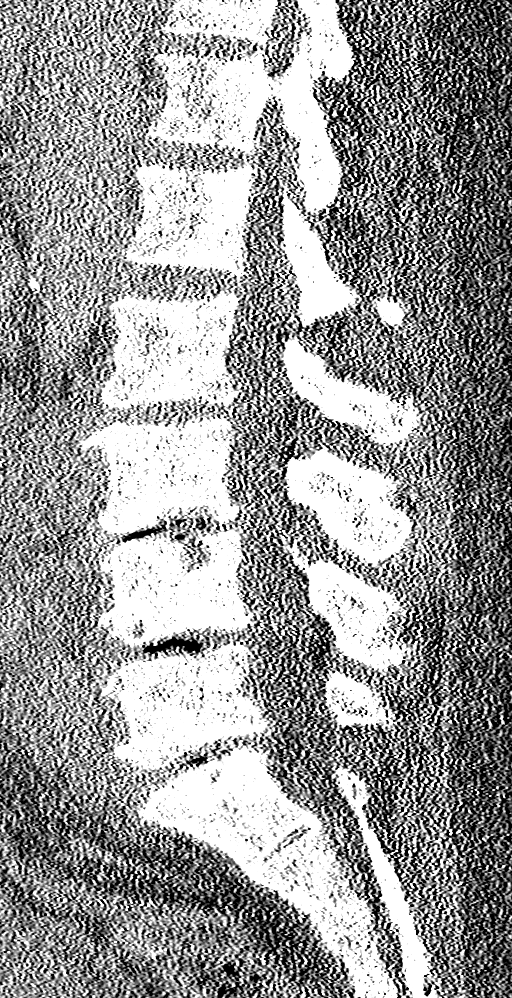
[im 38/76  bone]
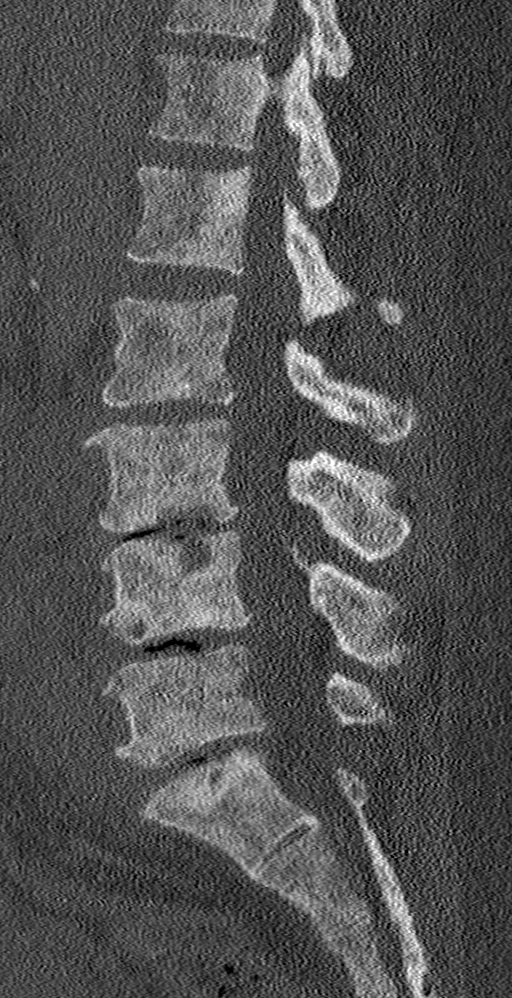
[im 44/76  bone]
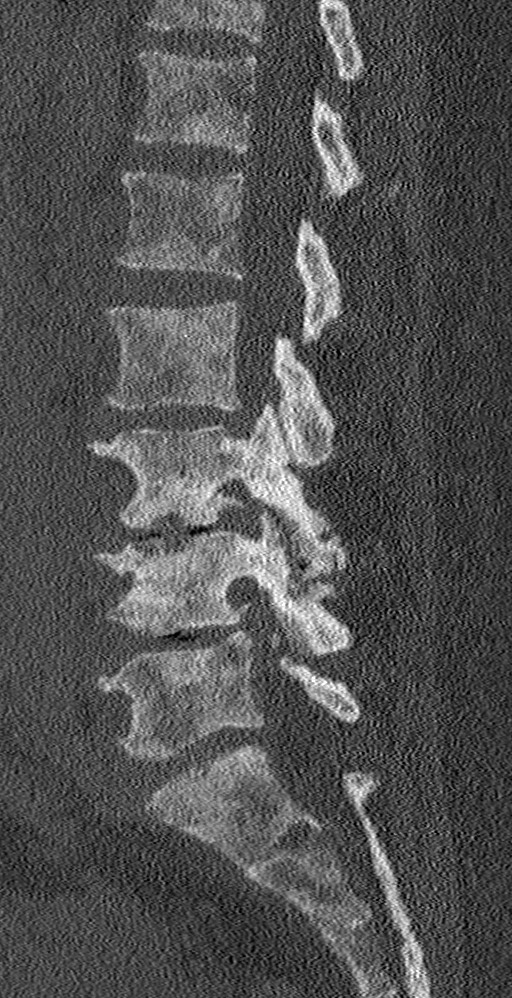
[im 51/76  bone]
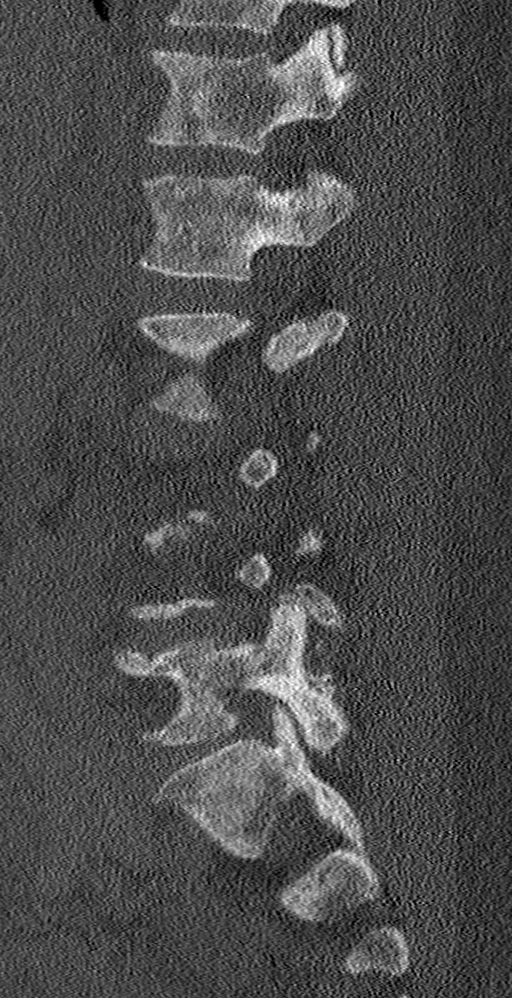

[9 of 33 positions shown; findings below may reference images not displayed]

FINDINGS: Segmentation: 5 lumbar type vertebrae.

Alignment: Levocurvature of the lumbar spine. Straightening of the
normal lumbar lordosis. Trace retrolisthesis L5 on S1.

Vertebrae: Diffuse sclerotic lesions throughout the imaged spine and
pelvis, concerning for diffuse osseous metastatic disease. No acute
fracture. Vertebral body heights are preserved.

Paraspinal and other soft tissues: Please see same-day CT abdomen
pelvis.

Disc levels:

T12-L1: No significant disc bulge. No spinal canal stenosis or
neural foraminal narrowing.

L1-L2: No significant disc bulge. No spinal canal stenosis or neural
foraminal narrowing.

L2-L3: Mild disc bulge. Moderate facet arthropathy. No spinal canal
stenosis or neural foraminal narrowing.

L3-L4: Large disc bulge. Severe right and mild left facet
arthropathy. Moderate spinal canal stenosis. Severe right neural
foraminal narrowing.

L4-L5: Moderate disc bulge. Moderate to severe right and mild left
facet arthropathy. No spinal canal stenosis. Moderate to severe
right neural foraminal narrowing.

L5-S1: Trace retrolisthesis and mild disc bulge. Moderate facet
arthropathy. No spinal canal stenosis. Mild right and moderate left
neural foraminal narrowing.
IMPRESSION: 1. Diffuse sclerotic lesions throughout the imaged spine and pelvis,
concerning for diffuse osseous metastatic disease.
2. No acute vertebral body fracture.
3. L3-L4 moderate spinal canal stenosis and severe right neural
foraminal narrowing.
4. L4-L5 moderate to severe right neural foraminal narrowing.
5. L5-S1 moderate left and mild right neural foraminal narrowing.

## 2021-09-28 MED ORDER — MORPHINE SULFATE (PF) 2 MG/ML IV SOLN
1.0000 mg | INTRAVENOUS | Status: DC | PRN
  Administered 2021-09-28 – 2021-10-01 (×5): 1 mg via INTRAVENOUS
  Filled 2021-09-28 (×5): qty 1

## 2021-09-28 MED ORDER — ACETAMINOPHEN 325 MG PO TABS
650.0000 mg | ORAL_TABLET | Freq: Four times a day (QID) | ORAL | Status: DC | PRN
  Administered 2021-10-02: 650 mg via ORAL
  Filled 2021-09-28: qty 2

## 2021-09-28 MED ORDER — ONDANSETRON HCL 4 MG/2ML IJ SOLN
4.0000 mg | Freq: Four times a day (QID) | INTRAMUSCULAR | Status: DC | PRN
  Administered 2021-10-01: 4 mg via INTRAVENOUS
  Filled 2021-09-28: qty 2

## 2021-09-28 MED ORDER — ONDANSETRON HCL 4 MG/2ML IJ SOLN
4.0000 mg | Freq: Once | INTRAMUSCULAR | Status: AC
  Administered 2021-09-28: 4 mg via INTRAVENOUS
  Filled 2021-09-28: qty 2

## 2021-09-28 MED ORDER — LOSARTAN POTASSIUM 25 MG PO TABS
12.5000 mg | ORAL_TABLET | Freq: Every day | ORAL | Status: DC
  Filled 2021-09-28 (×2): qty 0.5

## 2021-09-28 MED ORDER — LACTATED RINGERS IV SOLN: INTRAVENOUS | Status: AC

## 2021-09-28 MED ORDER — OXYCODONE HCL 5 MG PO TABS
5.0000 mg | ORAL_TABLET | ORAL | Status: DC | PRN
  Administered 2021-09-28 – 2021-09-30 (×3): 5 mg via ORAL
  Filled 2021-09-28 (×3): qty 1

## 2021-09-28 MED ORDER — IOHEXOL 350 MG/ML SOLN
100.0000 mL | Freq: Once | INTRAVENOUS | Status: AC | PRN
  Administered 2021-09-28: 80 mL via INTRAVENOUS

## 2021-09-28 MED ORDER — SODIUM CHLORIDE 0.9 % IV BOLUS
1000.0000 mL | Freq: Once | INTRAVENOUS | Status: DC
  Administered 2021-09-28: 1000 mL via INTRAVENOUS

## 2021-09-28 MED ORDER — METOPROLOL TARTRATE 25 MG PO TABS
25.0000 mg | ORAL_TABLET | Freq: Two times a day (BID) | ORAL | Status: DC
  Administered 2021-09-28 – 2021-10-02 (×3): 25 mg via ORAL
  Filled 2021-09-28 (×4): qty 1

## 2021-09-28 MED ORDER — SODIUM CHLORIDE 0.9 % IV BOLUS
1000.0000 mL | Freq: Once | INTRAVENOUS | Status: AC
  Administered 2021-09-28: 1000 mL via INTRAVENOUS

## 2021-09-28 MED ORDER — ONDANSETRON HCL 4 MG PO TABS: 4.0000 mg | ORAL_TABLET | Freq: Four times a day (QID) | ORAL | Status: DC | PRN

## 2021-09-28 MED ORDER — MORPHINE SULFATE (PF) 4 MG/ML IV SOLN
4.0000 mg | Freq: Once | INTRAVENOUS | Status: AC
  Administered 2021-09-28: 4 mg via INTRAVENOUS
  Filled 2021-09-28: qty 1

## 2021-09-28 MED ORDER — ATORVASTATIN CALCIUM 40 MG PO TABS
80.0000 mg | ORAL_TABLET | Freq: Every day | ORAL | Status: DC
  Administered 2021-09-30 – 2021-10-02 (×3): 80 mg via ORAL
  Filled 2021-09-28 (×3): qty 2

## 2021-09-28 MED ORDER — SENNOSIDES-DOCUSATE SODIUM 8.6-50 MG PO TABS: 1.0000 | ORAL_TABLET | Freq: Every evening | ORAL | Status: DC | PRN

## 2021-09-28 MED ORDER — ACETAMINOPHEN 650 MG RE SUPP: 650.0000 mg | Freq: Four times a day (QID) | RECTAL | Status: DC | PRN

## 2021-09-28 NOTE — ED Provider Notes (Signed)
[facility] DEPT Provider Note   CSN: [id number] Arrival date & time: 09/28/21  1322     History  Chief Complaint  Patient presents with   Abnormal Lab   Rectal Bleeding    Cyan Page is a 71 y.o. male hx of MI, HTN, here with weight loss, here with weakness and weight loss and anemia.  Patient states that he has not been feeling well for the 6 weeks or so.  Patient states that he has been having some blood when he wipes.  He states that there is some blood mixed with his stools but denies any black stools.  He went to see his doctor and had a hemoglobin of 7.0 today.  He also recently when his doctor had an elevated PSA level.  His doctor was concerned that maybe he has prostate cancer.  No history of prostate cancer in the past.  Patient is on any blood thinners.  Patient also complains of diffuse lower back pain as well.  The history is provided by the patient.      Home Medications Prior to Admission medications   Medication Sig Start Date End Date Taking? Authorizing Provider  aspirin EC 81 MG EC tablet Take 1 tablet (81 mg total) by mouth daily. 08/16/15   Lyda Jester M, PA-C  atorvastatin (LIPITOR) 80 MG tablet Take 1 tablet (80 mg total) by mouth daily. TAKE ONE TABLET BY MOUTH DAILY  **MUST CALL MD FOR APPOINTMENT 09/19/21   Arnoldo Lenis, MD  losartan (COZAAR) 25 MG tablet Take 0.5 tablets (12.5 mg total) by mouth daily. TAKE ONE-HALF TABLET BY MOUTH DAILY ( NEEDS TO BE SEEN) 09/19/21   Arnoldo Lenis, MD  metoprolol tartrate (LOPRESSOR) 25 MG tablet Take 1 tablet (25 mg total) by mouth 2 (two) times daily. 09/19/21   Arnoldo Lenis, MD  nitroGLYCERIN (NITROSTAT) 0.4 MG SL tablet DISSOLVE 1 TAB UNDER TONGUE FOR CHEST PAIN - IF PAIN REMAINS AFTER 5 MIN, CALL 911 AND REPEAT DOSE. MAX 3 TABS IN 15 MINUTES 09/19/21   Branch, Alphonse Guild, MD      Allergies    Patient has no known allergies.    Review of Systems   Review of  Systems  Constitutional:  Positive for fatigue and unexpected weight change.  Gastrointestinal:  Positive for abdominal pain and blood in stool.  All other systems reviewed and are negative.  Physical Exam Updated Vital Signs BP 135/70    Pulse (!) 117    Temp 99.6 F (37.6 C) (Oral)    Resp (!) 25    Ht 6\' 2"  (1.88 m)    Wt 96.2 kg    SpO2 100%    BMI 27.22 kg/m  Physical Exam Vitals and nursing note reviewed.  Constitutional:      Comments: Chronically ill, pale  HENT:     Head: Normocephalic.     Nose: Nose normal.     Mouth/Throat:     Mouth: Mucous membranes are dry.  Eyes:     Extraocular Movements: Extraocular movements intact.     Comments: Conjunctiva is pale  Cardiovascular:     Rate and Rhythm: Normal rate and regular rhythm.     Pulses: Normal pulses.     Heart sounds: Normal heart sounds.  Pulmonary:     Effort: Pulmonary effort is normal.     Breath sounds: Normal breath sounds.  Abdominal:     Comments: Mild diffuse lower abdominal tenderness  Genitourinary:    Comments: Rectal- external hemorrhoids with no obvious thrombosis, blood on glove  Musculoskeletal:        General: Normal range of motion.     Cervical back: Normal range of motion and neck supple.     Comments: Mild diffuse lower lumbar tenderness   Skin:    General: Skin is warm.     Capillary Refill: Capillary refill takes less than 2 seconds.  Neurological:     General: No focal deficit present.     Mental Status: He is oriented to person, place, and time.  Psychiatric:        Mood and Affect: Mood normal.        Behavior: Behavior normal.    ED Results / Procedures / Treatments   Labs (all labs ordered are listed, but only abnormal results are displayed) Labs Reviewed  BASIC METABOLIC PANEL - Abnormal; Notable for the following components:      Result Value   Glucose, Bld 113 (*)    Calcium 8.2 (*)    All other components within normal limits  CBC WITH DIFFERENTIAL/PLATELET -  Abnormal; Notable for the following components:   RBC 2.60 (*)    Hemoglobin 7.7 (*)    HCT 23.9 (*)    RDW 15.7 (*)    nRBC 2.5 (*)    Abs Immature Granulocytes 0.11 (*)    All other components within normal limits  LACTIC ACID, PLASMA - Abnormal; Notable for the following components:   Lactic Acid, Venous 2.1 (*)    All other components within normal limits  BRAIN NATRIURETIC PEPTIDE - Abnormal; Notable for the following components:   B Natriuretic Peptide 332.3 (*)    All other components within normal limits  HEPATIC FUNCTION PANEL - Abnormal; Notable for the following components:   Albumin 2.9 (*)    Alkaline Phosphatase 358 (*)    Total Bilirubin 1.8 (*)    Bilirubin, Direct 0.3 (*)    Indirect Bilirubin 1.5 (*)    All other components within normal limits  POC OCCULT BLOOD, ED - Abnormal; Notable for the following components:   Fecal Occult Bld POSITIVE (*)    All other components within normal limits  LIPASE, BLOOD  LACTIC ACID, PLASMA  URINALYSIS, ROUTINE W REFLEX MICROSCOPIC  PSA  VITAMIN B12  FOLATE  IRON AND TIBC  FERRITIN  RETICULOCYTES  TYPE AND SCREEN    EKG None  Radiology DG Chest 2 View  Result Date: 09/28/2021 CLINICAL DATA:  shortness of breath EXAM: CHEST - 2 VIEW COMPARISON:  None. FINDINGS: The heart and mediastinal contours are within normal limits. Elevated left hemidiaphragm. No focal consolidation. Increased interstitial markings. No pleural effusion. No pneumothorax. No acute osseous abnormality. IMPRESSION: Increased interstitial markings. Query underlying infection/inflammation. Electronically Signed   By: Iven Finn M.D.   On: 09/28/2021 15:54   CT Abdomen Pelvis W Contrast  Result Date: 09/28/2021 CLINICAL DATA:  Evaluate for bowel perforation. Abdominal pain, nonlocalized. Complains of weakness and rectal bleeding. EXAM: CT ABDOMEN AND PELVIS WITH CONTRAST TECHNIQUE: Multidetector CT imaging of the abdomen and pelvis was performed  using the standard protocol following bolus administration of intravenous contrast. CONTRAST:  30mL OMNIPAQUE IOHEXOL 350 MG/ML SOLN COMPARISON:  None. FINDINGS: Lower chest: No acute abnormality. Hepatobiliary: No focal liver abnormality is seen. Tiny stones are noted layering within the dependent portion of the gallbladder measuring up to 3 mm. No signs of gallbladder wall inflammation or bile duct dilatation. Pancreas: Unremarkable.  No pancreatic ductal dilatation or surrounding inflammatory changes. Spleen: There is a small linear hypodensity within the posterior spleen measuring 1.3 cm. This is of uncertain clinical significance. Signs of adjacent rib fractures are identified which appears subacute to chronic. Adrenals/Urinary Tract: The adrenal glands are normal. Small left kidney cysts are identified. Left kidney is otherwise unremarkable. The right kidney appears diffusely edematous and there is severe right hydronephrosis and hydroureter. Markedly enlarged prostate gland is identified with mass effect upon the base of bladder with suspected tumor infiltration along the posterior wall of bladder, image 91/6. Stomach/Bowel: Stomach appears normal. The appendix is visualized and appears normal. No bowel wall thickening, inflammation, or distension. Vascular/Lymphatic: Aortic atherosclerosis without aneurysm. Extensive bilateral retroperitoneal and bilateral pelvic adenopathy identified compatible with metastatic disease. Index retrocaval lymph node measures 1.7 cm, image 35/2. Index left retroperitoneal lymph node measures 2.2 cm, image 38/2. Left common iliac lymph node is enlarged measuring 2 cm, image 55/2. Right pelvic sidewall lymph node measures 2.2 cm, image 71/2. Left pelvic sidewall lymph node measures 1.8 cm, image 72/2. Reproductive: The prostate gland is enlarged. The margins of the superior portions of the prostate gland are irregular with nodular soft tissue infiltration into the surrounding  periprosthetic fat. Findings are worrisome for prostate cancer. Other: No free fluid or fluid collections identified within the abdomen or pelvis. Musculoskeletal: Widespread sclerotic metastases are identified throughout the visualized axial and appendicular skeleton. Degenerative disc disease noted within the lumbar spine. IMPRESSION: 1. Enlarged prostate gland with nodular soft tissue infiltration into the surrounding periprosthetic fat. Signs of bladder wall involvement are identified. Findings are worrisome for prostate cancer with tumor extension into the surrounding soft tissues, diffuse nodal metastasis, and widespread osseous metastatic disease. 2. The right kidney is markedly edematous with signs of severe hydronephrosis and hydroureter. The distal right ureter is likely obstructed by either pelvic adenopathy or tumor invasion at the right UVJ. 3. Gallstones. 4. There is a linear hypodensity within the inferior spleen. Although no secondary signs of acute splenic injury there is adjacent subacute to chronic left rib fractures. Findings may reflect sequelae of old splenic injury. Correlate for any clinical history of trauma to the left upper quadrant abdominal wall. 5. Aortic Atherosclerosis (ICD10-I70.0). Electronically Signed   By: Kerby Moors M.D.   On: 09/28/2021 17:54   CT L-SPINE NO CHARGE  Result Date: 09/28/2021 CLINICAL DATA:  Weakness, rectal bleeding, nausea, lumbar pain EXAM: CT LUMBAR SPINE WITHOUT CONTRAST TECHNIQUE: Multidetector CT imaging of the lumbar spine was performed without intravenous contrast administration. Multiplanar CT image reconstructions were also generated. COMPARISON:  None. FINDINGS: Segmentation: 5 lumbar type vertebrae. Alignment: Levocurvature of the lumbar spine. Straightening of the normal lumbar lordosis. Trace retrolisthesis L5 on S1. Vertebrae: Diffuse sclerotic lesions throughout the imaged spine and pelvis, concerning for diffuse osseous metastatic disease.  No acute fracture. Vertebral body heights are preserved. Paraspinal and other soft tissues: Please see same-day CT abdomen pelvis. Disc levels: T12-L1: No significant disc bulge. No spinal canal stenosis or neural foraminal narrowing. L1-L2: No significant disc bulge. No spinal canal stenosis or neural foraminal narrowing. L2-L3: Mild disc bulge. Moderate facet arthropathy. No spinal canal stenosis or neural foraminal narrowing. L3-L4: Large disc bulge. Severe right and mild left facet arthropathy. Moderate spinal canal stenosis. Severe right neural foraminal narrowing. L4-L5: Moderate disc bulge. Moderate to severe right and mild left facet arthropathy. No spinal canal stenosis. Moderate to severe right neural foraminal narrowing. L5-S1: Trace retrolisthesis and mild disc bulge.  Moderate facet arthropathy. No spinal canal stenosis. Mild right and moderate left neural foraminal narrowing. IMPRESSION: 1. Diffuse sclerotic lesions throughout the imaged spine and pelvis, concerning for diffuse osseous metastatic disease. 2. No acute vertebral body fracture. 3. L3-L4 moderate spinal canal stenosis and severe right neural foraminal narrowing. 4. L4-L5 moderate to severe right neural foraminal narrowing. 5. L5-S1 moderate left and mild right neural foraminal narrowing. Electronically Signed   By: Merilyn Baba M.D.   On: 09/28/2021 17:52    Procedures Procedures    Medications Ordered in ED Medications  sodium chloride 0.9 % bolus 1,000 mL (has no administration in time range)  sodium chloride 0.9 % bolus 1,000 mL (1,000 mLs Intravenous New Bag/Given 09/28/21 1715)  ondansetron (ZOFRAN) injection 4 mg (4 mg Intravenous Given 09/28/21 1717)  morphine 4 MG/ML injection 4 mg (4 mg Intravenous Given 09/28/21 1717)  iohexol (OMNIPAQUE) 350 MG/ML injection 100 mL (80 mLs Intravenous Contrast Given 09/28/21 1724)    ED Course/ Medical Decision Making/ A&P                           Medical Decision Making Lakshya Mcgillicuddy  is a 71 y.o. male  Here presenting with abdominal pain and back pain and weight loss and blood in rectum. Concerned for possible metastatic cancer (colon or prostate or pancreatic or other). Will get labs, guiac, CT ab/pel.   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  Patient also has hydro on the right side.  I talked to Dr. Louis Meckel from urology.  He recommend PSA and he will see patient as consult.  I also messaged Dr. Michail Sermon from GI to see patient tomorrow since patient has guaiac positive stool.  However I think his anemia likely secondary to his metastatic prostate cancer.  Anemia panel sent  Amount and/or Complexity of Data Reviewed Independent Historian: parent and spouse External Data Reviewed: labs. Labs: ordered. Radiology: ordered and independent interpretation performed. Decision-making details documented in ED Course.    Details: metastatic prostate cancer to bone ECG/medicine tests: ordered. Decision-making details documented in ED Course.  Risk Decision regarding hospitalization.    Final Clinical Impression(s) / ED Diagnoses Final diagnoses:  None    Rx / DC Orders ED Discharge Orders     None         Drenda Freeze, MD 09/28/21 1910

## 2021-09-28 NOTE — ED Triage Notes (Addendum)
Patient reports that he went to his physician today with c/o weakness, rectal bleeding (bright red and dark red) x 3 weeks, nausea, and lumbar pain. Patient states he was told his Hgb was 7.0 and instructed to come to the ED for further evaluation.

## 2021-09-28 NOTE — ED Provider Triage Note (Signed)
Emergency Medicine Provider Triage Evaluation Note  [facility] , a 71 y.o. male  was evaluated in triage.  He has not been of his normal health in about 4 to 6 weeks now.  He complains of worsening generalized weakness.  He is also had rectal bleeding as been bright red and is only present when he wipes for about the past 3 weeks.  He has had worsening nausea and vomiting over the past couple of weeks as well.  He also complains of lumbar spine pain that has been present for about 6 weeks is gradually worsening.  He was told to come to the emergency department because his hemoglobin was found to be 7.0 at his doctor's office.  It looks like his baseline hemoglobin is 15.  Patient also reports worsening shortness of breath over the past couple weeks as well.  He was told that he had elevated PSA at his last doctor's appointment.  Review of Systems  Positive: Back pain, rectal bleeding, generalized weakness, nausea, vomiting, abdominal pain, shortness of breath Negative:   Physical Exam  BP 126/66    Pulse 99    Temp 99.6 F (37.6 C) (Oral)    Resp 17    Ht 6\' 2"  (1.88 m)    Wt 96.2 kg    SpO2 98%    BMI 27.22 kg/m  Gen:   Awake, uncomfortable.  Labored breathing Resp:  Labored breathing.  Lungs clear to auscultation bilaterally MSK:   Moves extremities without difficulty.  Midline L-spine TTP with no step-offs. Other:  Abdomen is fairly tender to palpation generalized.  Medical Decision Making  Medically screening exam initiated at 2:42 PM.  Appropriate orders placed.  Raahim Shartzer was informed that the remainder of the evaluation will be completed by another provider, this initial triage assessment does not replace that evaluation, and the importance of remaining in the ED until their evaluation is complete.  Concern for bleeding whether this is GI source or other source.  I also have concern of possible compression fracture or bone mets given that he has had a recent elevated PSA.  I have  obtained a CT abdomen pelvis as well as some lumbar spine to evaluate for intra abdominal perforation or other abnormality.    Adolphus Birchwood, PA-C 09/28/21 1455

## 2021-09-28 NOTE — Consult Note (Signed)
I have been asked to see the patient by Dr. 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  History of present illness: Very nice 71 year old African-American male, former professor of nursing and pastor, who presented to the emergency department with generalized weakness, failure to thrive, intermittent bloody stool, back pain, and they recently checked PSA of 1954 on 09/25/21.  (Patient has an appointment to see Dr. Jeffie Pollock tomorrow in our office, obviously this will be canceled.)  In the emergency department the patient underwent a CT scan.  This demonstrated highly irregular prostate with local extension into the right sidewall of the bladder with hydroureteronephrosis, extensive pelvic and periaortic lymphadenopathy, and several areas of spinal metastases.  He was noted to be anemic as well.  He has otherwise normal renal function and normal electrolytes.  His alkaline phosphatase was also elevated and to be malnourished with an albumin of 2.5.  The patient is complaining to me of back pain.  He is also having some severe post prandial nausea and vomiting.  He has been able to eat more today than he has in a while.  However, he has lost quite a bit of weight over the last 6 months.  He has no voiding symptoms.  He denies any hematuria.    Review of systems: A 12 point comprehensive review of systems was obtained and is negative unless otherwise stated in the history of present illness.  Patient Active Problem List   Diagnosis Date Noted   Symptomatic anemia 09/28/2021   HTN (hypertension)    Prostate cancer metastatic to multiple sites [facility])    Unstable angina ([place]) 08/15/2015   NSTEMI (non-ST elevated myocardial infarction) ([place]) 08/15/2015   Dissection of coronary artery    Coronary artery disease involving native coronary artery of native heart with unstable angina pectoris ([place])    Hyperglycemia     No current facility-administered medications on file  prior to encounter.   Current Outpatient Medications on File Prior to Encounter  Medication Sig Dispense Refill   aspirin EC 81 MG EC tablet Take 1 tablet (81 mg total) by mouth daily.     atorvastatin (LIPITOR) 80 MG tablet Take 1 tablet (80 mg total) by mouth daily. TAKE ONE TABLET BY MOUTH DAILY  **MUST CALL MD FOR APPOINTMENT (Patient taking differently: Take 80 mg by mouth daily.) 90 tablet 3   ibuprofen (ADVIL) 200 MG tablet Take 800 mg by mouth every 6 (six) hours as needed for moderate pain.     losartan (COZAAR) 25 MG tablet Take 0.5 tablets (12.5 mg total) by mouth daily. TAKE ONE-HALF TABLET BY MOUTH DAILY ( NEEDS TO BE SEEN) (Patient taking differently: Take 12.5 mg by mouth daily.) 45 tablet 3   metoprolol tartrate (LOPRESSOR) 25 MG tablet Take 1 tablet (25 mg total) by mouth 2 (two) times daily. (Patient taking differently: Take 25 mg by mouth daily.) 180 tablet 3   nitroGLYCERIN (NITROSTAT) 0.4 MG SL tablet DISSOLVE 1 TAB UNDER TONGUE FOR CHEST PAIN - IF PAIN REMAINS AFTER 5 MIN, CALL 911 AND REPEAT DOSE. MAX 3 TABS IN 15 MINUTES (Patient taking differently: 0.4 mg every 5 (five) minutes as needed for chest pain.) 25 tablet 3    Past Medical History:  Diagnosis Date   HTN (hypertension)    Myocardial infarction [facility])     Past Surgical History:  Procedure Laterality Date   CARDIAC CATHETERIZATION N/A 08/15/2015   Procedure: Left Heart Cath and Coronary Angiography;  Surgeon: Shanon Brow  Loren Racer, MD;  Location: [place] [facility];  Service: Cardiovascular;  Laterality: N/A;   CARDIAC CATHETERIZATION N/A 08/15/2015   Procedure: Left Heart Cath and Coronary Angiography;  Surgeon: Leonie Man, MD;  Location: [place] [facility];  Service: Cardiovascular;  Laterality: N/A;   CARDIAC CATHETERIZATION N/A 08/15/2015   Procedure: Coronary Stent Intervention;  Surgeon: Leonie Man, MD;  Location: [place] [facility];  Service: Cardiovascular;  Laterality: N/A;   TONSILLECTOMY       Social History   Tobacco Use   Smoking status: Never   Smokeless tobacco: Never  Vaping Use   Vaping Use: Never used  Substance Use Topics   Alcohol use: No    Alcohol/week: 0.0 standard drinks   Drug use: No    Family History  Problem Relation Age of Onset   Hypertension Other     PE: Vitals:   09/29/21 0959 09/29/21 1034 09/29/21 1312 09/29/21 1618  BP: 94/63 94/63 101/64 113/63  Pulse: 85 83 88 97  Resp: 16 16 16 16   Temp: 98.5 F (36.9 C) 98.4 F (36.9 C) 98.8 F (37.1 C) 100.1 F (37.8 C)  TempSrc: Oral Oral Oral Oral  SpO2: 93% 96% 97% 96%  Weight:      Height:       Patient appears to be in no acute distress  patient is alert and oriented x3 Atraumatic normocephalic head No cervical or supraclavicular lymphadenopathy appreciated No increased work of breathing, no audible wheezes/rhonchi Regular sinus rhythm/rate Abdomen is soft, nontender, nondistended Lower extremities are symmetric without appreciable edema Grossly neurologically intact No identifiable skin lesions  Recent Labs    09/28/21 1607 09/29/21 0602  WBC 5.3 4.6  HGB 7.7* 6.3*  HCT 23.9* 19.9*   Recent Labs    09/28/21 1607 09/29/21 0602  NA 136 135  K 4.1 4.2  CL 104 100  CO2 22 24  GLUCOSE 113* 133*  BUN 18 16  CREATININE 1.24 1.15  CALCIUM 8.2* 8.2*   No results for input(s): LABPT, INR in the last 72 hours. No results for input(s): LABURIN in the last 72 hours. Results for orders placed or performed during the hospital encounter of 09/28/21  Resp Panel by RT-PCR (Flu A&B, Covid) Nasopharyngeal Swab     Status: None   Collection Time: 09/28/21  9:48 PM   Specimen: Nasopharyngeal Swab; Nasopharyngeal(NP) swabs in vial transport medium  Result Value Ref Range Status   SARS Coronavirus 2 by RT PCR NEGATIVE NEGATIVE Final    Comment: (NOTE) SARS-CoV-2 target nucleic acids are NOT DETECTED.  The SARS-CoV-2 RNA is generally detectable in upper respiratory specimens  during the acute phase of infection. The lowest concentration of SARS-CoV-2 viral copies this assay can detect is 138 copies/mL. A negative result does not preclude SARS-Cov-2 infection and should not be used as the sole basis for treatment or other patient management decisions. A negative result may occur with  improper specimen collection/handling, submission of specimen other than nasopharyngeal swab, presence of viral mutation(s) within the areas targeted by this assay, and inadequate number of viral copies(<138 copies/mL). A negative result must be combined with clinical observations, patient history, and epidemiological information. The expected result is Negative.  Fact Sheet for Patients:  EntrepreneurPulse.com.au  Fact Sheet for Healthcare Providers:  IncredibleEmployment.be  This test is no t yet approved or cleared by the [place] FDA and  has been authorized for detection and/or diagnosis of SARS-CoV-2 by FDA under an Emergency  Use Authorization (EUA). This EUA will remain  in effect (meaning this test can be used) for the duration of the COVID-19 declaration under Section 564(b)(1) of the Act, 21 U.S.C.section 360bbb-3(b)(1), unless the authorization is terminated  or revoked sooner.       Influenza A by PCR NEGATIVE NEGATIVE Final   Influenza B by PCR NEGATIVE NEGATIVE Final    Comment: (NOTE) The Xpert Xpress SARS-CoV-2/FLU/RSV plus assay is intended as an aid in the diagnosis of influenza from Nasopharyngeal swab specimens and should not be used as a sole basis for treatment. Nasal washings and aspirates are unacceptable for Xpert Xpress SARS-CoV-2/FLU/RSV testing.  Fact Sheet for Patients: EntrepreneurPulse.com.au  Fact Sheet for Healthcare Providers: IncredibleEmployment.be  This test is not yet approved or cleared by the [place] FDA and has been authorized for detection  and/or diagnosis of SARS-CoV-2 by FDA under an Emergency Use Authorization (EUA). This EUA will remain in effect (meaning this test can be used) for the duration of the COVID-19 declaration under Section 564(b)(1) of the Act, 21 U.S.C. section 360bbb-3(b)(1), unless the authorization is terminated or revoked.  Performed at [facility], [place] [address], [place], [place] [address]     Imaging: I reviewed the patient's CT scan images as well as report.  I discussed this both with the patient and his brother.  Notable findings are as mentioned in the HPI.  Imp: The patient has presumed newly diagnosed metastatic prostate cancer based on his PSA of nearly 1500 and the findings on his CT scan consistent with locally advanced and metastatic prostate cancer.  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  Recommendations: I had a long discussion with the patient.  We discussed the natural history of prostate cancer.  We discussed the fact that this is incurable, but certainly treatable and a palliative sense.  We discussed in minor detail the treatment options.    I told him that our first step in the process of beginning treatment will be to place a nephrostomy tube and simultaneously perform a percutaneous needle biopsy of one of his enlarged lymph nodes.  I have spoken to interventional radiology, the plan would be for this to happen on Monday morning.  Once we have a diagnosis of prostate cancer the patient then would be eligible to proceed with prostate cancer treatment.  He would probably benefit both from androgen deprivation therapy in the form of [place] Koller initially as well as palliative radiation to some of the areas in his back that are causing him the most pain.  Once the patient has a biopsy, would be prudent to consult both oncology and radiation oncology for their further input.  Which would help jumpstart his treatment.  In the meantime,  the patient would definitely benefit from improved nutrition as well as optimization from a medical standpoint  Thank you for involving me in this patient's care, I will continue to follow along.

## 2021-09-28 NOTE — H&P (Addendum)
History and Physical    Erik Page QMV:[id number] DOB: 05/05/1951 DOA: 09/28/2021  PCP: Erik Fear, DO  Patient coming from: Home  I have personally briefly reviewed patient's old medical records in [place]  Chief Complaint: Weight loss, weakness, blood in stool  HPI: Erik Page is a 71 y.o. male with medical history significant for CAD s/p DES x3 07/2015, HTN, HLD who presented to the ED for evaluation of low back pain, weight loss, blood in stool.  Patient states that over the last 6 weeks he has been having progressive generalized weakness, worsening low back pain, fatigue, and seeing blood mixed in with his stool.  He went to his PCP and reportedly had a hemoglobin of 7.0 today.  He also had a reportedly elevated PSA level concerning for prostate cancer.  Patient is advised to come to the ED for further evaluation of anemia.  Patient states that he has had very poor appetite with frequent nausea and occasional emesis.  He has had approximately 30 pound weight loss over this time period.  He denies any difficulty with urination or dysuria.  He has not had any chest pain.  ED Course:  Initial vitals showed BP 126/66, pulse 99, RR 17, temp 99.6 F, SPO2 98% on room air.  Labs show WBC 5.3, hemoglobin 7.7, platelets 154,000, sodium 136, potassium 4.1, bicarb 22, BUN 18, creatinine 1.24, serum glucose 113, lactic acid 2.1, lipase 26, AST 29, ALT 20, alk phos 358, total bilirubin 1.8, BNP 332.3.  FOBT is positive.  2 view chest x-ray shows elevated left hemidiaphragm, increased interstitial markings without focal consolidation, effusion, pneumothorax.  CT abdomen/pelvis with contrast shows enlarged prostate gland with nodular soft tissue infiltration into the surrounding periprostatic fat.  Signs of bladder wall involvement are identified.  Findings worrisome for prostate cancer with tumor extension into the surrounding soft tissues, diffuse nodal metastasis, and widespread  osseous metastatic disease.  Right kidney shows signs of severe hydronephrosis and hydroureter.  Distal right ureter is likely obstructed by either pelvic adenopathy or tumor invasion at the right UVJ.  Gallstones noted.  Linear hypodensity within the inferior spleen seen.  CT L-spine shows diffuse sclerotic lesions throughout the imaged spine and pelvis concerning for diffuse osseous metastatic disease.  No acute vertebral body fracture seen.  L3-4 moderate spinal canal stenosis and severe right neural foraminal narrowing, L4-L5 moderate-severe right neuroforaminal narrowing also noted.  Patient was given 2 L normal saline, IV morphine, IV Zofran.  EDP discussed with on-call urology and gastroenterology will consult in the morning.  The hospitalist service was consulted to admit for further evaluation and management.  Review of Systems: All systems reviewed and are negative except as documented in history of present illness above.   Past Medical History:  Diagnosis Date   HTN (hypertension)    Myocardial infarction [facility])     Past Surgical History:  Procedure Laterality Date   CARDIAC CATHETERIZATION N/A 08/15/2015   Procedure: Left Heart Cath and Coronary Angiography;  Surgeon: Leonie Man, MD;  Location: [place] [facility];  Service: Cardiovascular;  Laterality: N/A;   CARDIAC CATHETERIZATION N/A 08/15/2015   Procedure: Left Heart Cath and Coronary Angiography;  Surgeon: Leonie Man, MD;  Location: [place] [facility];  Service: Cardiovascular;  Laterality: N/A;   CARDIAC CATHETERIZATION N/A 08/15/2015   Procedure: Coronary Stent Intervention;  Surgeon: Leonie Man, MD;  Location: [place] [facility];  Service: Cardiovascular;  Laterality: N/A;   TONSILLECTOMY  Social History:  reports that he has never smoked. He has never used smokeless tobacco. He reports that he does not drink alcohol and does not use drugs.  No Known Allergies  Family History  Problem Relation Age  of Onset   Hypertension Other      Prior to Admission medications   Medication Sig Start Date End Date Taking? Authorizing Provider  aspirin EC 81 MG EC tablet Take 1 tablet (81 mg total) by mouth daily. 08/16/15   Lyda Jester M, PA-C  atorvastatin (LIPITOR) 80 MG tablet Take 1 tablet (80 mg total) by mouth daily. TAKE ONE TABLET BY MOUTH DAILY  **MUST CALL MD FOR APPOINTMENT 09/19/21   Arnoldo Lenis, MD  losartan (COZAAR) 25 MG tablet Take 0.5 tablets (12.5 mg total) by mouth daily. TAKE ONE-HALF TABLET BY MOUTH DAILY ( NEEDS TO BE SEEN) 09/19/21   Arnoldo Lenis, MD  metoprolol tartrate (LOPRESSOR) 25 MG tablet Take 1 tablet (25 mg total) by mouth 2 (two) times daily. 09/19/21   Arnoldo Lenis, MD  nitroGLYCERIN (NITROSTAT) 0.4 MG SL tablet DISSOLVE 1 TAB UNDER TONGUE FOR CHEST PAIN - IF PAIN REMAINS AFTER 5 MIN, CALL 911 AND REPEAT DOSE. MAX 3 TABS IN 15 MINUTES 09/19/21   Arnoldo Lenis, MD    Physical Exam: Vitals:   09/28/21 1430 09/28/21 1657 09/28/21 1815 09/28/21 1900  BP:  (!) 107/54 137/78 135/70  Pulse:  100 (!) 109 (!) 117  Resp:  18 13 (!) 25  Temp:      TempSrc:      SpO2:  100% 92% 100%  Weight: 96.2 kg     Height: _0  (1.88 m)      Constitutional: Resting supine in bed, NAD, calm, comfortable Eyes: PERRL, lids and conjunctivae normal ENMT: Mucous membranes are moist. Posterior pharynx clear of any exudate or lesions.Normal dentition.  Neck: normal, supple, no masses. Respiratory: clear to auscultation bilaterally, no wheezing, no crackles. Normal respiratory effort. No accessory muscle use.  Cardiovascular: Regular rate and rhythm, no murmurs / rubs / gallops. No extremity edema. 2+ pedal pulses. Abdomen: no tenderness, no masses palpated. No hepatosplenomegaly. Bowel sounds positive.  Musculoskeletal: no clubbing / cyanosis. No joint deformity upper and lower extremities. Good ROM bilateral upper extremities, range of motion limited lower  extremities due to low back pain, no contractures. Normal muscle tone.  Skin: no rashes, lesions, ulcers. No induration Neurologic: CN 2-12 grossly intact. Sensation intact. Strength 5/5 bilateral upper extremities, largely intact lower extremities but limited due to pain from his low back. Psychiatric: Normal judgment and insight. Alert and oriented x 3. Normal mood.   Labs on Admission: I have personally reviewed following labs and imaging studies  CBC: Recent Labs  Lab 09/28/21 1607  WBC 5.3  NEUTROABS 3.4  HGB 7.7*  HCT 23.9*  MCV 91.9  PLT 983   Basic Metabolic Panel: Recent Labs  Lab 09/28/21 1607  NA 136  K 4.1  CL 104  CO2 22  GLUCOSE 113*  BUN 18  CREATININE 1.24  CALCIUM 8.2*   GFR: Estimated Creatinine Clearance: 63.5 mL/min (by C-G formula based on SCr of 1.24 mg/dL). Liver Function Tests: Recent Labs  Lab 09/28/21 1607  AST 29  ALT 20  ALKPHOS 358*  BILITOT 1.8*  PROT 7.2  ALBUMIN 2.9*   Recent Labs  Lab 09/28/21 1607  LIPASE 26   No results for input(s): AMMONIA in the last 168 hours. Coagulation Profile: No  results for input(s): INR, PROTIME in the last 168 hours. Cardiac Enzymes: No results for input(s): CKTOTAL, CKMB, CKMBINDEX, TROPONINI in the last 168 hours. BNP (last 3 results) No results for input(s): PROBNP in the last 8760 hours. HbA1C: No results for input(s): HGBA1C in the last 72 hours. CBG: No results for input(s): GLUCAP in the last 168 hours. Lipid Profile: No results for input(s): CHOL, HDL, LDLCALC, TRIG, CHOLHDL, LDLDIRECT in the last 72 hours. Thyroid Function Tests: No results for input(s): TSH, T4TOTAL, FREET4, T3FREE, THYROIDAB in the last 72 hours. Anemia Panel: No results for input(s): VITAMINB12, FOLATE, FERRITIN, TIBC, IRON, RETICCTPCT in the last 72 hours. Urine analysis: No results found for: COLORURINE, APPEARANCEUR, LABSPEC, [place], GLUCOSEU, HGBUR, BILIRUBINUR, KETONESUR, PROTEINUR, UROBILINOGEN,  NITRITE, LEUKOCYTESUR  Radiological Exams on Admission: DG Chest 2 View  Result Date: 09/28/2021 CLINICAL DATA:  shortness of breath EXAM: CHEST - 2 VIEW COMPARISON:  None. FINDINGS: The heart and mediastinal contours are within normal limits. Elevated left hemidiaphragm. No focal consolidation. Increased interstitial markings. No pleural effusion. No pneumothorax. No acute osseous abnormality. IMPRESSION: Increased interstitial markings. Query underlying infection/inflammation. Electronically Signed   By: Iven Finn M.D.   On: 09/28/2021 15:54   CT Abdomen Pelvis W Contrast  Result Date: 09/28/2021 CLINICAL DATA:  Evaluate for bowel perforation. Abdominal pain, nonlocalized. Complains of weakness and rectal bleeding. EXAM: CT ABDOMEN AND PELVIS WITH CONTRAST TECHNIQUE: Multidetector CT imaging of the abdomen and pelvis was performed using the standard protocol following bolus administration of intravenous contrast. CONTRAST:  52m OMNIPAQUE IOHEXOL 350 MG/ML SOLN COMPARISON:  None. FINDINGS: Lower chest: No acute abnormality. Hepatobiliary: No focal liver abnormality is seen. Tiny stones are noted layering within the dependent portion of the gallbladder measuring up to 3 mm. No signs of gallbladder wall inflammation or bile duct dilatation. Pancreas: Unremarkable. No pancreatic ductal dilatation or surrounding inflammatory changes. Spleen: There is a small linear hypodensity within the posterior spleen measuring 1.3 cm. This is of uncertain clinical significance. Signs of adjacent rib fractures are identified which appears subacute to chronic. Adrenals/Urinary Tract: The adrenal glands are normal. Small left kidney cysts are identified. Left kidney is otherwise unremarkable. The right kidney appears diffusely edematous and there is severe right hydronephrosis and hydroureter. Markedly enlarged prostate gland is identified with mass effect upon the base of bladder with suspected tumor infiltration along  the posterior wall of bladder, image 91/6. Stomach/Bowel: Stomach appears normal. The appendix is visualized and appears normal. No bowel wall thickening, inflammation, or distension. Vascular/Lymphatic: Aortic atherosclerosis without aneurysm. Extensive bilateral retroperitoneal and bilateral pelvic adenopathy identified compatible with metastatic disease. Index retrocaval lymph node measures 1.7 cm, image 35/2. Index left retroperitoneal lymph node measures 2.2 cm, image 38/2. Left common iliac lymph node is enlarged measuring 2 cm, image 55/2. Right pelvic sidewall lymph node measures 2.2 cm, image 71/2. Left pelvic sidewall lymph node measures 1.8 cm, image 72/2. Reproductive: The prostate gland is enlarged. The margins of the superior portions of the prostate gland are irregular with nodular soft tissue infiltration into the surrounding periprosthetic fat. Findings are worrisome for prostate cancer. Other: No free fluid or fluid collections identified within the abdomen or pelvis. Musculoskeletal: Widespread sclerotic metastases are identified throughout the visualized axial and appendicular skeleton. Degenerative disc disease noted within the lumbar spine. IMPRESSION: 1. Enlarged prostate gland with nodular soft tissue infiltration into the surrounding periprosthetic fat. Signs of bladder wall involvement are identified. Findings are worrisome for prostate cancer with tumor extension into  the surrounding soft tissues, diffuse nodal metastasis, and widespread osseous metastatic disease. 2. The right kidney is markedly edematous with signs of severe hydronephrosis and hydroureter. The distal right ureter is likely obstructed by either pelvic adenopathy or tumor invasion at the right UVJ. 3. Gallstones. 4. There is a linear hypodensity within the inferior spleen. Although no secondary signs of acute splenic injury there is adjacent subacute to chronic left rib fractures. Findings may reflect sequelae of old  splenic injury. Correlate for any clinical history of trauma to the left upper quadrant abdominal wall. 5. Aortic Atherosclerosis (ICD10-I70.0). Electronically Signed   By: Kerby Moors M.D.   On: 09/28/2021 17:54   CT L-SPINE NO CHARGE  Result Date: 09/28/2021 CLINICAL DATA:  Weakness, rectal bleeding, nausea, lumbar pain EXAM: CT LUMBAR SPINE WITHOUT CONTRAST TECHNIQUE: Multidetector CT imaging of the lumbar spine was performed without intravenous contrast administration. Multiplanar CT image reconstructions were also generated. COMPARISON:  None. FINDINGS: Segmentation: 5 lumbar type vertebrae. Alignment: Levocurvature of the lumbar spine. Straightening of the normal lumbar lordosis. Trace retrolisthesis L5 on S1. Vertebrae: Diffuse sclerotic lesions throughout the imaged spine and pelvis, concerning for diffuse osseous metastatic disease. No acute fracture. Vertebral body heights are preserved. Paraspinal and other soft tissues: Please see same-day CT abdomen pelvis. Disc levels: T12-L1: No significant disc bulge. No spinal canal stenosis or neural foraminal narrowing. L1-L2: No significant disc bulge. No spinal canal stenosis or neural foraminal narrowing. L2-L3: Mild disc bulge. Moderate facet arthropathy. No spinal canal stenosis or neural foraminal narrowing. L3-L4: Large disc bulge. Severe right and mild left facet arthropathy. Moderate spinal canal stenosis. Severe right neural foraminal narrowing. L4-L5: Moderate disc bulge. Moderate to severe right and mild left facet arthropathy. No spinal canal stenosis. Moderate to severe right neural foraminal narrowing. L5-S1: Trace retrolisthesis and mild disc bulge. Moderate facet arthropathy. No spinal canal stenosis. Mild right and moderate left neural foraminal narrowing. IMPRESSION: 1. Diffuse sclerotic lesions throughout the imaged spine and pelvis, concerning for diffuse osseous metastatic disease. 2. No acute vertebral body fracture. 3. L3-L4 moderate  spinal canal stenosis and severe right neural foraminal narrowing. 4. L4-L5 moderate to severe right neural foraminal narrowing. 5. L5-S1 moderate left and mild right neural foraminal narrowing. Electronically Signed   By: Merilyn Baba M.D.   On: 09/28/2021 17:52    EKG: Ordered and pending.  Assessment/Plan Principal Problem:   Prostate cancer metastatic to multiple sites [facility]) Active Problems:   Coronary artery disease involving native coronary artery of native heart with unstable angina pectoris (HCC)   Symptomatic anemia   HTN (hypertension)   Erik Page is a 71 y.o. male with medical history significant for CAD s/p DES x3 07/2015, HTN, HLD who is admitted for evaluation of newly found metastatic prostate cancer and symptomatic anemia.  Metastatic prostate cancer with severe right hydronephrosis and hydroureter: CT imaging shows findings worrisome for prostate cancer with tumor extension into the bladder wall, surrounding soft tissues, diffuse nodal metastasis, and widespread osseous metastatic disease.  Also noted to have severe right hydronephrosis and hydroureter due to distal right ureter obstruction at the right UVJ. -Urology consulted and to see -PSA ordered and pending -Continue pain control  Symptomatic anemia: Hemoglobin 7.7 on admission.  Most likely related to metastatic prostate cancer although FOBT is positive and he has seen blood mixed in with stool. -Monitor hemoglobin, transfuse as needed -Hold home aspirin -GI to see in a.m. -Keep n.p.o. after midnight  Elevated alkaline phosphatase and bilirubin:  Likely related to osseous metastasis and anemia.  Continue to monitor.  CAD s/p DES x3 07/2015: Stable, denies any chest pain.  Continue atorvastatin, Lopressor.  Holding aspirin as above.  Hypertension: Continue losartan and Lopressor.  Cancer associated pain: Is having continued significant low back pain interfering with mobility/ambulation.  Continue  analgesics as needed.  DVT prophylaxis: SCDs Code Status: DNR, confirmed with patient on admission Family Communication: Discussed with patient spouse and brother at bedside. Disposition Plan: From home, dispo pending further urology, GI and likely oncology evaluation. Consults called: Neurology, gastroenterology Level of care: Med-Surg Admission status:  Status is: Inpatient  Remains inpatient appropriate because: Newly diagnosed metastatic prostate cancer with osseous involvement and severe right hydronephrosis and right hydroureter due to distal right ureteral obstruction, symptomatic anemia.  Ongoing pain control for cancer associated low back pain interfering with mobility.  Zada Finders MD [facility]  If 7PM-7AM, please contact night-coverage [email or web address]  09/28/2021, 7:31 PM

## 2021-09-29 DIAGNOSIS — C61 Malignant neoplasm of prostate: Secondary | ICD-10-CM | POA: Diagnosis not present

## 2021-09-29 LAB — CBC
HCT: 19.9 % — ABNORMAL LOW (ref 39.0–52.0)
Hemoglobin: 6.3 g/dL — CL (ref 13.0–17.0)
MCH: 29.2 pg (ref 26.0–34.0)
MCHC: 31.7 g/dL (ref 30.0–36.0)
MCV: 92.1 fL (ref 80.0–100.0)
Platelets: 140 10*3/uL — ABNORMAL LOW (ref 150–400)
RBC: 2.16 MIL/uL — ABNORMAL LOW (ref 4.22–5.81)
RDW: 15.8 % — ABNORMAL HIGH (ref 11.5–15.5)
WBC: 4.6 10*3/uL (ref 4.0–10.5)
nRBC: 2.4 % — ABNORMAL HIGH (ref 0.0–0.2)

## 2021-09-29 LAB — COMPREHENSIVE METABOLIC PANEL
ALT: 17 U/L (ref 0–44)
AST: 22 U/L (ref 15–41)
Albumin: 2.5 g/dL — ABNORMAL LOW (ref 3.5–5.0)
Alkaline Phosphatase: 283 U/L — ABNORMAL HIGH (ref 38–126)
Anion gap: 11 (ref 5–15)
BUN: 16 mg/dL (ref 8–23)
CO2: 24 mmol/L (ref 22–32)
Calcium: 8.2 mg/dL — ABNORMAL LOW (ref 8.9–10.3)
Chloride: 100 mmol/L (ref 98–111)
Creatinine, Ser: 1.15 mg/dL (ref 0.61–1.24)
GFR, Estimated: >60 mL/min (ref >60)
Glucose, Bld: 133 mg/dL — ABNORMAL HIGH (ref 70–99)
Potassium: 4.2 mmol/L (ref 3.5–5.1)
Sodium: 135 mmol/L (ref 135–145)
Total Bilirubin: 1.7 mg/dL — ABNORMAL HIGH (ref 0.3–1.2)
Total Protein: 6.2 g/dL — ABNORMAL LOW (ref 6.5–8.1)

## 2021-09-29 LAB — PSA: Prostatic Specific Antigen: >1460 ng/mL (ref 0.00–4.00)

## 2021-09-29 LAB — PREPARE RBC (CROSSMATCH)

## 2021-09-29 MED ORDER — FUROSEMIDE 10 MG/ML IJ SOLN: 20.0000 mg | Freq: Once | INTRAMUSCULAR | Status: DC

## 2021-09-29 MED ORDER — SODIUM CHLORIDE 0.9% IV SOLUTION: Freq: Once | INTRAVENOUS | Status: AC

## 2021-09-29 MED ORDER — ACETAMINOPHEN 325 MG PO TABS
650.0000 mg | ORAL_TABLET | Freq: Once | ORAL | Status: AC
  Administered 2021-09-29: 650 mg via ORAL
  Filled 2021-09-29: qty 2

## 2021-09-29 MED ORDER — DIPHENHYDRAMINE HCL 25 MG PO CAPS
25.0000 mg | ORAL_CAPSULE | Freq: Once | ORAL | Status: AC
  Administered 2021-09-29: 25 mg via ORAL
  Filled 2021-09-29: qty 1

## 2021-09-29 MED ORDER — FUROSEMIDE 20 MG PO TABS
20.0000 mg | ORAL_TABLET | Freq: Once | ORAL | Status: AC
  Administered 2021-09-29: 20 mg via ORAL
  Filled 2021-09-29: qty 1

## 2021-09-29 MED ORDER — LACTATED RINGERS IV SOLN: INTRAVENOUS | Status: DC

## 2021-09-29 NOTE — Progress Notes (Signed)
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  Obstruction 2/2 pelvic adenopathy versus tumor invasion CT L-spine = sclerotic lesion-L3-4 moderate stenosis, L4-5 moderate right foraminal narrowing Patient hydrated with 2 L of saline Dr. Louis Meckel of urology were consulted, Dr. Michail Sermon GI was consulted as well because of positive guaiac  Hospital-Problem based course  Anemia likely secondary to malignancy GI feels this is likely hemorrhoidal and I have signed off  hemoglobin below transfusion trigger 7-give 1 unit PRBC Patient does not have any overtly bloody stools Hold prior to admission aspirin 81 ibuprofen 800 every 6 and monitor trends Low back pain progressive weakness weight loss PSA above 1400 Imaging concerning for likely prostate cancer given widespread metastatic disease--alkaline phosphatase probably represents metastatic disease May benefit from XRT to sclerotic lesions in back-we will ask radiation oncology opinion if urology feels this is appropriate Because he has been unable to ambulate he will need PT eval-his preference is to go home Pain control with Oxy IR every 4 as needed IV morphine may be given for severe pain-escalate meds if not controlled Hydroureter extension into bladder wall and hydronephrosis urology to see Going for percutaneous nephrostomy and CT-guided biopsy on 1/9  Continue saline at 50  cc/H CAD with PCI 2016 Aspirin held at this time Continue losartan 25 metoprolol 25 twice daily and Lipitor 80 mg  DVT prophylaxis: SCD Code Status: Full Family Communication: None Disposition:  Status is: Inpatient  Remains inpatient appropriate because: Further work-up and opinion from specialists in addition to symptom management  Consultants:  Gastroenterology Urology  Procedures:   Antimicrobials:     Subjective: Pleasant coherent tells me is quite weak was not even a table got up to the bathroom-he has also been finding it difficult to get into his car over about the same period of time He has no chest pain or fever He is passing good urine When he passes stool he says sometimes blood is mixed in with it but no overt blood  Objective: Vitals:   09/28/21 2221 09/28/21 2223 09/29/21 0202 09/29/21 0543  BP: 112/68  106/68 107/69  Pulse: (!) 108  91 90  Resp: 18  16 20   Temp: 99.1 F (37.3 C)  99 F (37.2 C) 98.5 F (36.9 C)  TempSrc: Oral  Oral Oral  SpO2: 96%  95% 97%  Weight:  96 kg    Height:  6' 2"  (1.88 m)      Intake/Output Summary (Last 24 hours) at 09/29/2021 0749 Last data filed at 09/29/2021 0500 Gross per 24 hour  Intake 1573.23 ml  Output 400 ml  Net 1173.23 ml   Filed Weights   09/28/21 1430 09/28/21 2223  Weight: 96.2 kg 96 kg    Examination:  EOMI NCAT no focal deficit Chest clear no rales no rhonchi no wheeze Abdomen soft no rebound no guarding No lower extremity edema S1-S2 no murmur Neurologically intact moving 4 limbs equally without deficit  Data Reviewed: personally reviewed   CBC  Component Value Date/Time   WBC 4.6 09/29/2021 0602   RBC 2.16 (L) 09/29/2021 0602   HGB 6.3 (LL) 09/29/2021 0602   HCT 19.9 (L) 09/29/2021 0602   PLT 140 (L) 09/29/2021 0602   MCV 92.1 09/29/2021 0602   MCH 29.2 09/29/2021 0602   MCHC 31.7 09/29/2021 0602   RDW 15.8 (H) 09/29/2021 0602   LYMPHSABS 1.2 09/28/2021 1607   MONOABS 0.6  09/28/2021 1607   EOSABS 0.0 09/28/2021 1607   BASOSABS 0.0 09/28/2021 1607   CMP Latest Ref Rng & Units 09/29/2021 09/28/2021 08/16/2015  Glucose 70 - 99 mg/dL 133(H) 113(H) 164(H)  BUN 8 - 23 mg/dL 16 18 6   Creatinine 0.61 - 1.24 mg/dL 1.15 1.24 0.91  Sodium 135 - 145 mmol/L 135 136 136  Potassium 3.5 - 5.1 mmol/L 4.2 4.1 4.2  Chloride 98 - 111 mmol/L 100 104 100(L)  CO2 22 - 32 mmol/L 24 22 27   Calcium 8.9 - 10.3 mg/dL 8.2(L) 8.2(L) 8.1(L)  Total Protein 6.5 - 8.1 g/dL 6.2(L) 7.2 -  Total Bilirubin 0.3 - 1.2 mg/dL 1.7(H) 1.8(H) -  Alkaline Phos 38 - 126 U/L 283(H) 358(H) -  AST 15 - 41 U/L 22 29 -  ALT 0 - 44 U/L 17 20 -     Radiology Studies: DG Chest 2 View  Result Date: 09/28/2021 CLINICAL DATA:  shortness of breath EXAM: CHEST - 2 VIEW COMPARISON:  None. FINDINGS: The heart and mediastinal contours are within normal limits. Elevated left hemidiaphragm. No focal consolidation. Increased interstitial markings. No pleural effusion. No pneumothorax. No acute osseous abnormality. IMPRESSION: Increased interstitial markings. Query underlying infection/inflammation. Electronically Signed   By: Iven Finn M.D.   On: 09/28/2021 15:54   CT Abdomen Pelvis W Contrast  Result Date: 09/28/2021 CLINICAL DATA:  Evaluate for bowel perforation. Abdominal pain, nonlocalized. Complains of weakness and rectal bleeding. EXAM: CT ABDOMEN AND PELVIS WITH CONTRAST TECHNIQUE: Multidetector CT imaging of the abdomen and pelvis was performed using the standard protocol following bolus administration of intravenous contrast. CONTRAST:  39m OMNIPAQUE IOHEXOL 350 MG/ML SOLN COMPARISON:  None. FINDINGS: Lower chest: No acute abnormality. Hepatobiliary: No focal liver abnormality is seen. Tiny stones are noted layering within the dependent portion of the gallbladder measuring up to 3 mm. No signs of gallbladder wall inflammation or bile duct dilatation. Pancreas: Unremarkable. No pancreatic ductal dilatation or  surrounding inflammatory changes. Spleen: There is a small linear hypodensity within the posterior spleen measuring 1.3 cm. This is of uncertain clinical significance. Signs of adjacent rib fractures are identified which appears subacute to chronic. Adrenals/Urinary Tract: The adrenal glands are normal. Small left kidney cysts are identified. Left kidney is otherwise unremarkable. The right kidney appears diffusely edematous and there is severe right hydronephrosis and hydroureter. Markedly enlarged prostate gland is identified with mass effect upon the base of bladder with suspected tumor infiltration along the posterior wall of bladder, image 91/6. Stomach/Bowel: Stomach appears normal. The appendix is visualized and appears normal. No bowel wall thickening, inflammation, or distension. Vascular/Lymphatic: Aortic atherosclerosis without aneurysm. Extensive bilateral retroperitoneal and bilateral pelvic adenopathy identified compatible with metastatic disease. Index retrocaval lymph node measures 1.7 cm, image 35/2. Index left retroperitoneal lymph node measures 2.2 cm, image 38/2. Left common iliac lymph node is enlarged measuring 2 cm, image 55/2. Right pelvic sidewall lymph node measures 2.2 cm, image 71/2. Left pelvic sidewall lymph node measures 1.8 cm, image 72/2. Reproductive: The prostate gland is enlarged. The margins of the superior portions  of the prostate gland are irregular with nodular soft tissue infiltration into the surrounding periprosthetic fat. Findings are worrisome for prostate cancer. Other: No free fluid or fluid collections identified within the abdomen or pelvis. Musculoskeletal: Widespread sclerotic metastases are identified throughout the visualized axial and appendicular skeleton. Degenerative disc disease noted within the lumbar spine. IMPRESSION: 1. Enlarged prostate gland with nodular soft tissue infiltration into the surrounding periprosthetic fat. Signs of bladder wall involvement  are identified. Findings are worrisome for prostate cancer with tumor extension into the surrounding soft tissues, diffuse nodal metastasis, and widespread osseous metastatic disease. 2. The right kidney is markedly edematous with signs of severe hydronephrosis and hydroureter. The distal right ureter is likely obstructed by either pelvic adenopathy or tumor invasion at the right UVJ. 3. Gallstones. 4. There is a linear hypodensity within the inferior spleen. Although no secondary signs of acute splenic injury there is adjacent subacute to chronic left rib fractures. Findings may reflect sequelae of old splenic injury. Correlate for any clinical history of trauma to the left upper quadrant abdominal wall. 5. Aortic Atherosclerosis (ICD10-I70.0). Electronically Signed   By: Kerby Moors M.D.   On: 09/28/2021 17:54   CT L-SPINE NO CHARGE  Result Date: 09/28/2021 CLINICAL DATA:  Weakness, rectal bleeding, nausea, lumbar pain EXAM: CT LUMBAR SPINE WITHOUT CONTRAST TECHNIQUE: Multidetector CT imaging of the lumbar spine was performed without intravenous contrast administration. Multiplanar CT image reconstructions were also generated. COMPARISON:  None. FINDINGS: Segmentation: 5 lumbar type vertebrae. Alignment: Levocurvature of the lumbar spine. Straightening of the normal lumbar lordosis. Trace retrolisthesis L5 on S1. Vertebrae: Diffuse sclerotic lesions throughout the imaged spine and pelvis, concerning for diffuse osseous metastatic disease. No acute fracture. Vertebral body heights are preserved. Paraspinal and other soft tissues: Please see same-day CT abdomen pelvis. Disc levels: T12-L1: No significant disc bulge. No spinal canal stenosis or neural foraminal narrowing. L1-L2: No significant disc bulge. No spinal canal stenosis or neural foraminal narrowing. L2-L3: Mild disc bulge. Moderate facet arthropathy. No spinal canal stenosis or neural foraminal narrowing. L3-L4: Large disc bulge. Severe right and  mild left facet arthropathy. Moderate spinal canal stenosis. Severe right neural foraminal narrowing. L4-L5: Moderate disc bulge. Moderate to severe right and mild left facet arthropathy. No spinal canal stenosis. Moderate to severe right neural foraminal narrowing. L5-S1: Trace retrolisthesis and mild disc bulge. Moderate facet arthropathy. No spinal canal stenosis. Mild right and moderate left neural foraminal narrowing. IMPRESSION: 1. Diffuse sclerotic lesions throughout the imaged spine and pelvis, concerning for diffuse osseous metastatic disease. 2. No acute vertebral body fracture. 3. L3-L4 moderate spinal canal stenosis and severe right neural foraminal narrowing. 4. L4-L5 moderate to severe right neural foraminal narrowing. 5. L5-S1 moderate left and mild right neural foraminal narrowing. Electronically Signed   By: Merilyn Baba M.D.   On: 09/28/2021 17:52     Scheduled Meds:  sodium chloride   Intravenous Once   acetaminophen  650 mg Oral Once   atorvastatin  80 mg Oral Daily   diphenhydrAMINE  25 mg Oral Once   furosemide  20 mg Intravenous Once   losartan  12.5 mg Oral Daily   metoprolol tartrate  25 mg Oral BID   Continuous Infusions:  lactated ringers 125 mL/hr at 09/29/21 0305     LOS: 1 day   Time spent: [place], MD [facility] To contact the attending provider between 7A-7P or the covering provider during after hours 7P-7A, please log into the web site  [email or web address] and access using universal [place] password for that web site. If you do not have the password, please call the hospital operator.  09/29/2021, 7:49 AM

## 2021-09-29 NOTE — Plan of Care (Signed)
°  Problem: Safety: Goal: Ability to remain free from injury will improve Outcome: Progressing   Problem: Activity: Goal: Risk for activity intolerance will decrease Outcome: Not Progressing   Problem: Nutrition: Goal: Adequate nutrition will be maintained Outcome: Not Progressing   Problem: Coping: Goal: Level of anxiety will decrease Outcome: Not Progressing   Problem: Pain Managment: Goal: General experience of comfort will improve Outcome: Not Progressing

## 2021-09-29 NOTE — Consult Note (Signed)
Referring Provider: Dr. Posey Pronto Primary Care Physician:  [name], DO Primary Gastroenterologist:  Althia Forts  Reason for Consultation:  Anemia; Heme positive stool  HPI: Erik Page is a 71 y.o. male with CAD, HTN, HLD who has been having weakness and unintentional weight loss for the past 6 weeks with worsening of the weakness during the last 2 weeks. Has unintentionally lost 30 pounds in the past 6 weeks. Has seen blood with wiping and mixed in his stool on occasion during this time. Denies abdominal pain. Has been having nausea. Dry heaving at times and has been afraid to eat because he gets nauseous with eating. Has never had a colonoscopy/EGD. Reportedly Hgb 7 at his PCP's office yesterday and elevated PSA. Hgb 7.7 on admit yesterday and 6.3 this morning. Elevated PSA and CT shows metastatic prostate cancer with bone mets.  Past Medical History:  Diagnosis Date   HTN (hypertension)    Myocardial infarction [facility])     Past Surgical History:  Procedure Laterality Date   CARDIAC CATHETERIZATION N/A 08/15/2015   Procedure: Left Heart Cath and Coronary Angiography;  Surgeon: Leonie Man, MD;  Location: [place] [facility];  Service: Cardiovascular;  Laterality: N/A;   CARDIAC CATHETERIZATION N/A 08/15/2015   Procedure: Left Heart Cath and Coronary Angiography;  Surgeon: Leonie Man, MD;  Location: [place] [facility];  Service: Cardiovascular;  Laterality: N/A;   CARDIAC CATHETERIZATION N/A 08/15/2015   Procedure: Coronary Stent Intervention;  Surgeon: Leonie Man, MD;  Location: [place] [facility];  Service: Cardiovascular;  Laterality: N/A;   TONSILLECTOMY      Prior to Admission medications   Medication Sig Start Date End Date Taking? Authorizing Provider  aspirin EC 81 MG EC tablet Take 1 tablet (81 mg total) by mouth daily. 08/16/15  Yes Lyda Jester M, PA-C  atorvastatin (LIPITOR) 80 MG tablet Take 1 tablet (80 mg total) by mouth daily. TAKE ONE TABLET BY MOUTH  DAILY  **MUST CALL MD FOR APPOINTMENT Patient taking differently: Take 80 mg by mouth daily. 09/19/21  Yes BranchAlphonse Guild, MD  ibuprofen (ADVIL) 200 MG tablet Take 800 mg by mouth every 6 (six) hours as needed for moderate pain.   Yes [provider]  losartan (COZAAR) 25 MG tablet Take 0.5 tablets (12.5 mg total) by mouth daily. TAKE ONE-HALF TABLET BY MOUTH DAILY ( NEEDS TO BE SEEN) Patient taking differently: Take 12.5 mg by mouth daily. 09/19/21  Yes Branch, Alphonse Guild, MD  metoprolol tartrate (LOPRESSOR) 25 MG tablet Take 1 tablet (25 mg total) by mouth 2 (two) times daily. Patient taking differently: Take 25 mg by mouth daily. 09/19/21  Yes Branch, Alphonse Guild, MD  nitroGLYCERIN (NITROSTAT) 0.4 MG SL tablet DISSOLVE 1 TAB UNDER TONGUE FOR CHEST PAIN - IF PAIN REMAINS AFTER 5 MIN, CALL 911 AND REPEAT DOSE. MAX 3 TABS IN 15 MINUTES Patient taking differently: 0.4 mg every 5 (five) minutes as needed for chest pain. 09/19/21  Yes Arnoldo Lenis, MD    Scheduled Meds:  sodium chloride   Intravenous Once   atorvastatin  80 mg Oral Daily   furosemide  20 mg Intravenous Once   losartan  12.5 mg Oral Daily   metoprolol tartrate  25 mg Oral BID   Continuous Infusions: PRN Meds:.acetaminophen **OR** acetaminophen, morphine injection, ondansetron **OR** ondansetron (ZOFRAN) IV, oxyCODONE, senna-docusate  Allergies as of 09/28/2021   (No Known Allergies)    Family History  Problem Relation Age of Onset  Hypertension Other     Social History   Socioeconomic History   Marital status: Married    Spouse name: Not on file   Number of children: Not on file   Years of education: Not on file   Highest education level: Not on file  Occupational History   Not on file  Tobacco Use   Smoking status: Never   Smokeless tobacco: Never  Vaping Use   Vaping Use: Never used  Substance and Sexual Activity   Alcohol use: No    Alcohol/week: 0.0 standard drinks   Drug use: No    Sexual activity: Not on file  Other Topics Concern   Not on file  Social History Narrative   Works as a Engineer, materials; 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  Physical Exam: Vital signs: Vitals:   09/29/21 0202 09/29/21 0543  BP: 106/68 107/69  Pulse: 91 90  Resp: 16 20  Temp: 99 F (37.2 C) 98.5 F (36.9 C)  SpO2: 95% 97%   Last BM Date: 09/28/21 General:   Lethargic, well-nourished, no acute distress, pleasant A Head: normocephalic, atraumatic Eyes: anicteric sclera ENT: oropharynx clear Neck: supple, nontender Lungs:  Clear throughout to auscultation.   No wheezes, crackles, or rhonchi. No acute distress. Heart:  Regular rate and rhythm; no murmurs, clicks, rubs,  or gallops. Abdomen: soft, nontender, nondistended, +BS  Rectal:  Deferred Ext: no edema  GI:  Lab Results: Recent Labs    09/28/21 1607 09/29/21 0602  WBC 5.3 4.6  HGB 7.7* 6.3*  HCT 23.9* 19.9*  PLT 154 140*   BMET Recent Labs    09/28/21 1607 09/29/21 0602  NA 136 135  K 4.1 4.2  CL 104 100  CO2 22 24  GLUCOSE 113* 133*  BUN 18 16  CREATININE 1.24 1.15  CALCIUM 8.2* 8.2*   LFT Recent Labs    09/28/21 1607 09/29/21 0602  PROT 7.2 6.2*  ALBUMIN 2.9* 2.5*  AST 29 22  ALT 20 17  ALKPHOS 358* 283*  BILITOT 1.8* 1.7*  BILIDIR 0.3*  --   IBILI 1.5*  --    PT/INR No results for input(s): LABPROT, INR in the last 72 hours.   Studies/Results: DG Chest 2 View  Result Date: 09/28/2021 CLINICAL DATA:  shortness of breath EXAM: CHEST - 2 VIEW COMPARISON:  None. FINDINGS: The heart and mediastinal contours are within normal limits. Elevated left hemidiaphragm. No  focal consolidation. Increased interstitial markings. No pleural effusion. No pneumothorax. No acute osseous abnormality. IMPRESSION: Increased interstitial markings. Query underlying infection/inflammation. Electronically Signed   By: Iven Finn M.D.   On: 09/28/2021 15:54   CT Abdomen Pelvis W Contrast  Result Date: 09/28/2021 CLINICAL DATA:  Evaluate for bowel perforation. Abdominal pain, nonlocalized. Complains of weakness and rectal bleeding. EXAM: CT ABDOMEN AND PELVIS WITH CONTRAST TECHNIQUE: Multidetector CT imaging of the abdomen and pelvis was performed using the standard protocol following bolus administration of intravenous contrast. CONTRAST:  27m OMNIPAQUE IOHEXOL 350 MG/ML SOLN COMPARISON:  None. FINDINGS: Lower chest: No acute abnormality. Hepatobiliary: No focal liver abnormality is seen. Tiny stones are noted  layering within the dependent portion of the gallbladder measuring up to 3 mm. No signs of gallbladder wall inflammation or bile duct dilatation. Pancreas: Unremarkable. No pancreatic ductal dilatation or surrounding inflammatory changes. Spleen: There is a small linear hypodensity within the posterior spleen measuring 1.3 cm. This is of uncertain clinical significance. Signs of adjacent rib fractures are identified which appears subacute to chronic. Adrenals/Urinary Tract: The adrenal glands are normal. Small left kidney cysts are identified. Left kidney is otherwise unremarkable. The right kidney appears diffusely edematous and there is severe right hydronephrosis and hydroureter. Markedly enlarged prostate gland is identified with mass effect upon the base of bladder with suspected tumor infiltration along the posterior wall of bladder, image 91/6. Stomach/Bowel: Stomach appears normal. The appendix is visualized and appears normal. No bowel wall thickening, inflammation, or distension. Vascular/Lymphatic: Aortic atherosclerosis without aneurysm. Extensive bilateral retroperitoneal  and bilateral pelvic adenopathy identified compatible with metastatic disease. Index retrocaval lymph node measures 1.7 cm, image 35/2. Index left retroperitoneal lymph node measures 2.2 cm, image 38/2. Left common iliac lymph node is enlarged measuring 2 cm, image 55/2. Right pelvic sidewall lymph node measures 2.2 cm, image 71/2. Left pelvic sidewall lymph node measures 1.8 cm, image 72/2. Reproductive: The prostate gland is enlarged. The margins of the superior portions of the prostate gland are irregular with nodular soft tissue infiltration into the surrounding periprosthetic fat. Findings are worrisome for prostate cancer. Other: No free fluid or fluid collections identified within the abdomen or pelvis. Musculoskeletal: Widespread sclerotic metastases are identified throughout the visualized axial and appendicular skeleton. Degenerative disc disease noted within the lumbar spine. IMPRESSION: 1. Enlarged prostate gland with nodular soft tissue infiltration into the surrounding periprosthetic fat. Signs of bladder wall involvement are identified. Findings are worrisome for prostate cancer with tumor extension into the surrounding soft tissues, diffuse nodal metastasis, and widespread osseous metastatic disease. 2. The right kidney is markedly edematous with signs of severe hydronephrosis and hydroureter. The distal right ureter is likely obstructed by either pelvic adenopathy or tumor invasion at the right UVJ. 3. Gallstones. 4. There is a linear hypodensity within the inferior spleen. Although no secondary signs of acute splenic injury there is adjacent subacute to chronic left rib fractures. Findings may reflect sequelae of old splenic injury. Correlate for any clinical history of trauma to the left upper quadrant abdominal wall. 5. Aortic Atherosclerosis (ICD10-I70.0). Electronically Signed   By: Kerby Moors M.D.   On: 09/28/2021 17:54   CT L-SPINE NO CHARGE  Result Date: 09/28/2021 CLINICAL DATA:   Weakness, rectal bleeding, nausea, lumbar pain EXAM: CT LUMBAR SPINE WITHOUT CONTRAST TECHNIQUE: Multidetector CT imaging of the lumbar spine was performed without intravenous contrast administration. Multiplanar CT image reconstructions were also generated. COMPARISON:  None. FINDINGS: Segmentation: 5 lumbar type vertebrae. Alignment: Levocurvature of the lumbar spine. Straightening of the normal lumbar lordosis. Trace retrolisthesis L5 on S1. Vertebrae: Diffuse sclerotic lesions throughout the imaged spine and pelvis, concerning for diffuse osseous metastatic disease. No acute fracture. Vertebral body heights are preserved. Paraspinal and other soft tissues: Please see same-day CT abdomen pelvis. Disc levels: T12-L1: No significant disc bulge. No spinal canal stenosis or neural foraminal narrowing. L1-L2: No significant disc bulge. No spinal canal stenosis or neural foraminal narrowing. L2-L3: Mild disc bulge. Moderate facet arthropathy. No spinal canal stenosis or neural foraminal narrowing. L3-L4: Large disc bulge. Severe right and mild left facet arthropathy. Moderate spinal canal stenosis. Severe right neural foraminal narrowing. L4-L5: Moderate disc bulge. Moderate to  severe right and mild left facet arthropathy. No spinal canal stenosis. Moderate to severe right neural foraminal narrowing. L5-S1: Trace retrolisthesis and mild disc bulge. Moderate facet arthropathy. No spinal canal stenosis. Mild right and moderate left neural foraminal narrowing. IMPRESSION: 1. Diffuse sclerotic lesions throughout the imaged spine and pelvis, concerning for diffuse osseous metastatic disease. 2. No acute vertebral body fracture. 3. L3-L4 moderate spinal canal stenosis and severe right neural foraminal narrowing. 4. L4-L5 moderate to severe right neural foraminal narrowing. 5. L5-S1 moderate left and mild right neural foraminal narrowing. Electronically Signed   By: Merilyn Baba M.D.   On: 09/28/2021 17:52     Impression/Plan: Severe anemia in the setting of newly diagnosed metastatic prostate cancer. Small amount of rectal bleeding likely due to hemorrhoids but rectal involvement by met prostate cancer also possible. I think his anemia is mainly due to his malignancy and not due to a primary GI source. I do not think he needs a colonoscopy or EGD at this time. Continue supportive care. Advance diet. Will sign off. Call if questions.    LOS: 1 day   Lear Ng  09/29/2021, 9:43 AM  Questions please call [phone]

## 2021-09-29 NOTE — Progress Notes (Signed)
°   09/29/21 2051  Assess: MEWS Score  Temp (!) 100.7 F (38.2 C)  BP 106/75  Pulse Rate (!) 104  Resp 16  SpO2 95 %  O2 Device Room Air  Assess: MEWS Score  MEWS Temp 1  MEWS Systolic 0  MEWS Pulse 1  MEWS RR 0  MEWS LOC 0  MEWS Score 2  MEWS Score Color Yellow  Assess: if the MEWS score is Yellow or Red  Were vital signs taken at a resting state? No  Focused Assessment No change from prior assessment  Does the patient meet 2 or more of the SIRS criteria? No  MEWS guidelines implemented *See Row Information* Yes  Treat  MEWS Interventions Administered prn meds/treatments  Take Vital Signs  Increase Vital Sign Frequency  Yellow: Q 2hr X 2 then Q 4hr X 2, if remains yellow, continue Q 4hrs  Escalate  MEWS: Escalate Yellow: discuss with charge nurse/RN and consider discussing with provider and RRT  Notify: Charge Nurse/RN  Name of Charge Nurse/RN Notified Dillon Bjork (self)  Date Charge Nurse/RN Notified 09/29/21  Time Charge Nurse/RN Notified 2054  Notify: Provider  Provider Name/Title [facility]  Date Provider Notified 09/29/21  Time Provider Notified 2054  Notification Type Page  Notification Reason Other (Comment) (yellow mews)  Provider response See new orders  Date of Provider Response 09/29/21  Time of Provider Response 2054  Notify: Rapid Response  Name of Rapid Response RN Notified n/a; not necessary at this time.  Document  Patient Outcome Stabilized after interventions  Progress note created (see row info) Yes  Assess: SIRS CRITERIA  SIRS Temperature  0  SIRS Pulse 1  SIRS Respirations  0  SIRS WBC 0  SIRS Score Sum  1

## 2021-09-30 ENCOUNTER — Encounter (HOSPITAL_COMMUNITY): Payer: Self-pay | Admitting: Internal Medicine

## 2021-09-30 DIAGNOSIS — C61 Malignant neoplasm of prostate: Secondary | ICD-10-CM | POA: Diagnosis not present

## 2021-09-30 LAB — COMPREHENSIVE METABOLIC PANEL
ALT: 15 U/L (ref 0–44)
AST: 22 U/L (ref 15–41)
Albumin: 2.4 g/dL — ABNORMAL LOW (ref 3.5–5.0)
Alkaline Phosphatase: 244 U/L — ABNORMAL HIGH (ref 38–126)
Anion gap: 10 (ref 5–15)
BUN: 19 mg/dL (ref 8–23)
CO2: 24 mmol/L (ref 22–32)
Calcium: 7.7 mg/dL — ABNORMAL LOW (ref 8.9–10.3)
Chloride: 102 mmol/L (ref 98–111)
Creatinine, Ser: 1.22 mg/dL (ref 0.61–1.24)
GFR, Estimated: >60 mL/min (ref >60)
Glucose, Bld: 106 mg/dL — ABNORMAL HIGH (ref 70–99)
Potassium: 3.9 mmol/L (ref 3.5–5.1)
Sodium: 136 mmol/L (ref 135–145)
Total Bilirubin: 1.7 mg/dL — ABNORMAL HIGH (ref 0.3–1.2)
Total Protein: 6.1 g/dL — ABNORMAL LOW (ref 6.5–8.1)

## 2021-09-30 LAB — CBC WITH DIFFERENTIAL/PLATELET
Abs Immature Granulocytes: 0.2 10*3/uL — ABNORMAL HIGH (ref 0.00–0.07)
Basophils Absolute: 0 10*3/uL (ref 0.0–0.1)
Basophils Relative: 0 %
Eosinophils Absolute: 0 10*3/uL (ref 0.0–0.5)
Eosinophils Relative: 1 %
HCT: 23.1 % — ABNORMAL LOW (ref 39.0–52.0)
Hemoglobin: 7.5 g/dL — ABNORMAL LOW (ref 13.0–17.0)
Immature Granulocytes: 4 %
Lymphocytes Relative: 24 %
Lymphs Abs: 1.3 10*3/uL (ref 0.7–4.0)
MCH: 29.4 pg (ref 26.0–34.0)
MCHC: 32.5 g/dL (ref 30.0–36.0)
MCV: 90.6 fL (ref 80.0–100.0)
Monocytes Absolute: 0.5 10*3/uL (ref 0.1–1.0)
Monocytes Relative: 10 %
Neutro Abs: 3.3 10*3/uL (ref 1.7–7.7)
Neutrophils Relative %: 61 %
Platelets: 136 10*3/uL — ABNORMAL LOW (ref 150–400)
RBC: 2.55 MIL/uL — ABNORMAL LOW (ref 4.22–5.81)
RDW: 16 % — ABNORMAL HIGH (ref 11.5–15.5)
WBC: 5.3 10*3/uL (ref 4.0–10.5)
nRBC: 1.9 % — ABNORMAL HIGH (ref 0.0–0.2)

## 2021-09-30 MED ORDER — OXYCODONE-ACETAMINOPHEN 5-325 MG PO TABS: 1.0000 | ORAL_TABLET | ORAL | Status: DC | PRN

## 2021-09-30 MED ORDER — OXYCODONE-ACETAMINOPHEN 5-325 MG PO TABS
2.0000 | ORAL_TABLET | ORAL | Status: DC | PRN
  Administered 2021-09-30: 2 via ORAL
  Filled 2021-09-30: qty 2

## 2021-09-30 MED ORDER — IBUPROFEN 200 MG PO TABS
600.0000 mg | ORAL_TABLET | Freq: Three times a day (TID) | ORAL | Status: DC
  Administered 2021-09-30 – 2021-10-02 (×6): 600 mg via ORAL
  Filled 2021-09-30 (×6): qty 3

## 2021-09-30 MED ORDER — OXYCODONE HCL 5 MG PO TABS: 5.0000 mg | ORAL_TABLET | ORAL | Status: DC | PRN

## 2021-09-30 MED ORDER — OXYCODONE-ACETAMINOPHEN 5-325 MG PO TABS
1.0000 | ORAL_TABLET | ORAL | Status: DC | PRN
  Administered 2021-09-30 – 2021-10-02 (×5): 1 via ORAL
  Filled 2021-09-30 (×5): qty 1

## 2021-09-30 MED ORDER — ADULT MULTIVITAMIN W/MINERALS CH
1.0000 | ORAL_TABLET | Freq: Every day | ORAL | Status: DC
  Administered 2021-09-30 – 2021-10-02 (×3): 1 via ORAL
  Filled 2021-09-30 (×3): qty 1

## 2021-09-30 MED ORDER — OXYCODONE HCL 5 MG PO TABS
5.0000 mg | ORAL_TABLET | ORAL | Status: DC | PRN
  Administered 2021-09-30 – 2021-10-02 (×4): 5 mg via ORAL
  Filled 2021-09-30 (×5): qty 1

## 2021-09-30 MED ORDER — OXYCODONE HCL 5 MG PO TABS
5.0000 mg | ORAL_TABLET | ORAL | Status: DC | PRN
Start: 2021-09-30 — End: 2021-09-30

## 2021-09-30 MED ORDER — SODIUM CHLORIDE 0.9 % IV SOLN
2.0000 g | INTRAVENOUS | Status: AC
  Administered 2021-10-01: 2 g via INTRAVENOUS
  Filled 2021-09-30: qty 20

## 2021-09-30 MED ORDER — ENSURE ENLIVE PO LIQD
237.0000 mL | Freq: Two times a day (BID) | ORAL | Status: DC
  Administered 2021-09-30 – 2021-10-02 (×2): 237 mL via ORAL

## 2021-09-30 NOTE — Progress Notes (Signed)
Initial Nutrition Assessment  DOCUMENTATION CODES:   Not applicable  INTERVENTION:   Encourage good PO intake  Multivitamin w/ minerals daily Ensure Enlive po BID, each supplement provides 350 kcal and 20 grams of protein  NUTRITION DIAGNOSIS:   Increased nutrient needs related to cancer and cancer related treatments as evidenced by estimated needs.  GOAL:   Patient will meet greater than or equal to 90% of their needs  MONITOR:   PO intake, Supplement acceptance, Weight trends  REASON FOR ASSESSMENT:   Malnutrition Screening Tool    ASSESSMENT:   71 y.o. male presented to the ED with back pain, weight loss, and blood in his stool. PMH includes HTN and CAD. Pt admitted with concern for metastatic prostate cancer and symptomatic anemia.71 y.o. male presented to the ED with back pain, weight loss, and blood in his stool. PMH includes HTN and CAD. Pt admitted with concern for metastatic prostate cancer and symptomatic anemia.   RD working remotely, unable to reach pt via phone.  Per MD notes, pt has had a very poor appetite and ~30# weight loss in about 6 weeks. Pt also noted to have nausea and vomiting during that time.  Per EMR, pt ate 75% of his dinner on 1/7.  Per EMR, pt has had 3.5% weight loss within a week and a half.   Per GI note, suspect blood in stool is from hemorrhoids. No plan to proceed with EGD or colonoscopy at this time.   Pending right PCN placement and biopsy with IR on Monday.   RD will provide additional ONS to provide additional calories and protein due to poor appetite.   Medications reviewed and include: IV antibiotics  Labs reviewed.  NUTRITION - FOCUSED PHYSICAL EXAM:  Deferred to follow-up.   Diet Order:   Diet Order             Diet NPO time specified Except for: Sips with Meds  Diet effective midnight           Diet regular Room service appropriate? Yes; Fluid consistency: Thin  Diet effective now                    EDUCATION NEEDS:   No education needs have been identified at this time  Skin:  Skin Assessment: Reviewed RN Assessment  Last BM:  01/06  Height:   Ht Readings from Last 1 Encounters:  09/28/21 6\' 2"  (1.88 m)    Weight:   Wt Readings from Last 1 Encounters:  09/28/21 96 kg    Ideal Body Weight:  86.4 kg  BMI:  Body mass index is 27.17 kg/m.  Estimated Nutritional Needs:   Kcal:  0034-9179  Protein:  115-130 grams  Fluid:  >/= 2.3 L    Pawel Soules Louie Casa, RD, LDN Clinical Dietitian See [facility] for contact information.

## 2021-09-30 NOTE — Progress Notes (Signed)
PT Cancellation Note  Patient Details Name: Erik Page MRN: [id number] DOB: [birth year]   Cancelled Treatment:    Reason Eval/Treat Not Completed: Pain limiting ability to participate. Spoke with nurse who requested pt come back at another time. Will check back as schedule permits.   Galen Manila 09/30/2021, 9:22 AM

## 2021-09-30 NOTE — Progress Notes (Signed)
Addendum to the consult note.   Risks and benefits of Right PCN placement and RP LAN biopsy was discussed with the patient including, but not limited to, infection, bleeding, significant bleeding causing loss or decrease in renal function or damage to adjacent structures, and low yield.   All of the patient's questions were answered, patient is agreeable to proceed.  Consent signed and in chart.   Armando Gang Laqueena Hinchey PA-C 09/30/2021 9:46 AM

## 2021-09-30 NOTE — Progress Notes (Signed)
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  Obstruction 2/2 pelvic adenopathy versus tumor invasion CT L-spine = sclerotic lesion-L3-4 moderate stenosis, L4-5 moderate right foraminal narrowing Patient hydrated with 2 L of saline Dr. Louis Meckel of urology were consulted, Dr. Michail Sermon GI was consulted as well because of positive guaiac  Hospital-Problem based course  Anemia likely secondary to malignancy GI feels this is likely hemorrhoidal and I have signed off  hemoglobin below transfusion trigger 7-give 1 unit PRBC -hemoglobin has improved Patient does not have any overtly bloody stools Hold prior to admission aspirin 81  Low back pain progressive weakness weight loss PSA above 1400  likely prostate cancer given widespread metastatic disease--alkaline phosphatase probably represents metastatic disease May benefit from XRT to sclerotic lesions in back-we will ask radiation oncology to see him In consult and see if radiation may help Await PT eval Pain control with increased Oxy IR every 4 to Percocet 2 tablets every 4 as needed moderate pain First-line-- added back ibuprofen 600 3 times daily May take morphine 1 mg every 2 as needed for severe pain  Fevers Likely secondary to underlying prostate issues-if spikes above 100.5 again we will get a UA as he may have an obstructive component UA from admission was negative for  leukocytes Metastatic prostate cancer hydroureter extension into bladder wall and hydronephrosis Going for percutaneous nephrostomy and CT-guided biopsy on 1/9  Increase saline at 50 cc/H-->100 cc/H CAD with PCI 2016 Aspirin held at this time Holding losartan, continue metoprolol 25 twice daily and Lipitor 80 mg 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 Stop losartan, increase fluid rate as above   DVT prophylaxis: SCD Code Status: Full Family Communication: None Disposition:  Status is: Inpatient  Remains inpatient appropriate because: Further work-up and opinion from specialists in addition to symptom management  Consultants:  Gastroenterology Urology  Procedures:   Antimicrobials:     Subjective: He feels pretty weak and his pain is not well controlled-we increased his opiates and he feels a little better today He is having difficulty raising his legs off the bed He had a low-grade temp as well   Objective: Vitals:   09/29/21 2256 09/30/21 0054 09/30/21 0710 09/30/21 0906  BP: 116/65 119/71 114/72 136/80  Pulse: (!) 105 (!) 102 98 98  Resp: 16 18 20 15   Temp: 99.8 F (37.7 C) 99.4 F (37.4 C) 99.6 F (37.6 C) 98 F (36.7 C)  TempSrc: Oral Oral Oral Oral  SpO2: 94% 95% 94% 94%  Weight:      Height:        Intake/Output Summary (Last 24 hours) at 09/30/2021 1029 Last data filed at 09/30/2021 0622 Gross per 24 hour  Intake 1553.05 ml  Output 1100 ml  Net 453.05 ml    Filed Weights   09/28/21 1430 09/28/21 2223  Weight: 96.2 kg 96 kg    Examination:  EOMI NCAT no focal deficit Chest clear no added sound no wheeze Abdomen soft no rebound no guarding No  lower extremity edema ROM intact but limited by pain Psych flat  Data Reviewed: personally reviewed   CBC    Component Value Date/Time   WBC 5.3 09/30/2021 0553   RBC 2.55 (L) 09/30/2021 0553   HGB 7.5 (L) 09/30/2021 0553   HCT 23.1 (L) 09/30/2021 0553   PLT 136 (L) 09/30/2021 0553   MCV 90.6 09/30/2021 0553    MCH 29.4 09/30/2021 0553   MCHC 32.5 09/30/2021 0553   RDW 16.0 (H) 09/30/2021 0553   LYMPHSABS 1.3 09/30/2021 0553   MONOABS 0.5 09/30/2021 0553   EOSABS 0.0 09/30/2021 0553   BASOSABS 0.0 09/30/2021 0553   CMP Latest Ref Rng & Units 09/30/2021 09/29/2021 09/28/2021  Glucose 70 - 99 mg/dL 106(H) 133(H) 113(H)  BUN 8 - 23 mg/dL 19 16 18   Creatinine 0.61 - 1.24 mg/dL 1.22 1.15 1.24  Sodium 135 - 145 mmol/L 136 135 136  Potassium 3.5 - 5.1 mmol/L 3.9 4.2 4.1  Chloride 98 - 111 mmol/L 102 100 104  CO2 22 - 32 mmol/L 24 24 22   Calcium 8.9 - 10.3 mg/dL 7.7(L) 8.2(L) 8.2(L)  Total Protein 6.5 - 8.1 g/dL 6.1(L) 6.2(L) 7.2  Total Bilirubin 0.3 - 1.2 mg/dL 1.7(H) 1.7(H) 1.8(H)  Alkaline Phos 38 - 126 U/L 244(H) 283(H) 358(H)  AST 15 - 41 U/L 22 22 29   ALT 0 - 44 U/L 15 17 20      Radiology Studies: DG Chest 2 View  Result Date: 09/28/2021 CLINICAL DATA:  shortness of breath EXAM: CHEST - 2 VIEW COMPARISON:  None. FINDINGS: The heart and mediastinal contours are within normal limits. Elevated left hemidiaphragm. No focal consolidation. Increased interstitial markings. No pleural effusion. No pneumothorax. No acute osseous abnormality. IMPRESSION: Increased interstitial markings. Query underlying infection/inflammation. Electronically Signed   By: Iven Finn M.D.   On: 09/28/2021 15:54   CT Abdomen Pelvis W Contrast  Result Date: 09/28/2021 CLINICAL DATA:  Evaluate for bowel perforation. Abdominal pain, nonlocalized. Complains of weakness and rectal bleeding. EXAM: CT ABDOMEN AND PELVIS WITH CONTRAST TECHNIQUE: Multidetector CT imaging of the abdomen and pelvis was performed using the standard protocol following bolus administration of intravenous contrast. CONTRAST:  35m OMNIPAQUE IOHEXOL 350 MG/ML SOLN COMPARISON:  None. FINDINGS: Lower chest: No acute abnormality. Hepatobiliary: No focal liver abnormality is seen. Tiny stones are noted layering within the dependent portion of the gallbladder  measuring up to 3 mm. No signs of gallbladder wall inflammation or bile duct dilatation. Pancreas: Unremarkable. No pancreatic ductal dilatation or surrounding inflammatory changes. Spleen: There is a small linear hypodensity within the posterior spleen measuring 1.3 cm. This is of uncertain clinical significance. Signs of adjacent rib fractures are identified which appears subacute to chronic. Adrenals/Urinary Tract: The adrenal glands are normal. Small left kidney cysts are identified. Left kidney is otherwise unremarkable. The right kidney appears diffusely edematous and there is severe right hydronephrosis and hydroureter. Markedly enlarged prostate gland is identified with mass effect upon the base of bladder with suspected tumor infiltration along the posterior wall of bladder, image 91/6. Stomach/Bowel: Stomach appears normal. The appendix is visualized and appears normal. No bowel wall thickening, inflammation, or distension. Vascular/Lymphatic: Aortic atherosclerosis without aneurysm. Extensive bilateral retroperitoneal and bilateral pelvic adenopathy identified compatible with metastatic disease. Index retrocaval lymph node measures 1.7 cm, image 35/2. Index left retroperitoneal lymph node measures 2.2 cm, image 38/2. Left common iliac lymph node is enlarged measuring 2 cm, image 55/2. Right pelvic sidewall lymph node measures 2.2 cm, image 71/2.  Left pelvic sidewall lymph node measures 1.8 cm, image 72/2. Reproductive: The prostate gland is enlarged. The margins of the superior portions of the prostate gland are irregular with nodular soft tissue infiltration into the surrounding periprosthetic fat. Findings are worrisome for prostate cancer. Other: No free fluid or fluid collections identified within the abdomen or pelvis. Musculoskeletal: Widespread sclerotic metastases are identified throughout the visualized axial and appendicular skeleton. Degenerative disc disease noted within the lumbar spine.  IMPRESSION: 1. Enlarged prostate gland with nodular soft tissue infiltration into the surrounding periprosthetic fat. Signs of bladder wall involvement are identified. Findings are worrisome for prostate cancer with tumor extension into the surrounding soft tissues, diffuse nodal metastasis, and widespread osseous metastatic disease. 2. The right kidney is markedly edematous with signs of severe hydronephrosis and hydroureter. The distal right ureter is likely obstructed by either pelvic adenopathy or tumor invasion at the right UVJ. 3. Gallstones. 4. There is a linear hypodensity within the inferior spleen. Although no secondary signs of acute splenic injury there is adjacent subacute to chronic left rib fractures. Findings may reflect sequelae of old splenic injury. Correlate for any clinical history of trauma to the left upper quadrant abdominal wall. 5. Aortic Atherosclerosis (ICD10-I70.0). Electronically Signed   By: Kerby Moors M.D.   On: 09/28/2021 17:54   CT L-SPINE NO CHARGE  Result Date: 09/28/2021 CLINICAL DATA:  Weakness, rectal bleeding, nausea, lumbar pain EXAM: CT LUMBAR SPINE WITHOUT CONTRAST TECHNIQUE: Multidetector CT imaging of the lumbar spine was performed without intravenous contrast administration. Multiplanar CT image reconstructions were also generated. COMPARISON:  None. FINDINGS: Segmentation: 5 lumbar type vertebrae. Alignment: Levocurvature of the lumbar spine. Straightening of the normal lumbar lordosis. Trace retrolisthesis L5 on S1. Vertebrae: Diffuse sclerotic lesions throughout the imaged spine and pelvis, concerning for diffuse osseous metastatic disease. No acute fracture. Vertebral body heights are preserved. Paraspinal and other soft tissues: Please see same-day CT abdomen pelvis. Disc levels: T12-L1: No significant disc bulge. No spinal canal stenosis or neural foraminal narrowing. L1-L2: No significant disc bulge. No spinal canal stenosis or neural foraminal narrowing.  L2-L3: Mild disc bulge. Moderate facet arthropathy. No spinal canal stenosis or neural foraminal narrowing. L3-L4: Large disc bulge. Severe right and mild left facet arthropathy. Moderate spinal canal stenosis. Severe right neural foraminal narrowing. L4-L5: Moderate disc bulge. Moderate to severe right and mild left facet arthropathy. No spinal canal stenosis. Moderate to severe right neural foraminal narrowing. L5-S1: Trace retrolisthesis and mild disc bulge. Moderate facet arthropathy. No spinal canal stenosis. Mild right and moderate left neural foraminal narrowing. IMPRESSION: 1. Diffuse sclerotic lesions throughout the imaged spine and pelvis, concerning for diffuse osseous metastatic disease. 2. No acute vertebral body fracture. 3. L3-L4 moderate spinal canal stenosis and severe right neural foraminal narrowing. 4. L4-L5 moderate to severe right neural foraminal narrowing. 5. L5-S1 moderate left and mild right neural foraminal narrowing. Electronically Signed   By: Merilyn Baba M.D.   On: 09/28/2021 17:52     Scheduled Meds:  atorvastatin  80 mg Oral Daily   ibuprofen  600 mg Oral TID   losartan  12.5 mg Oral Daily   metoprolol tartrate  25 mg Oral BID   Continuous Infusions:  [START ON 10/01/2021] cefTRIAXone (ROCEPHIN)  IV     lactated ringers 100 mL/hr at 09/30/21 1038     LOS: 2 days   Time spent: [place], MD [facility] To contact the attending provider between 7A-7P or the covering provider during  after hours 7P-7A, please log into the web site [email or web address] and access using universal [place] password for that web site. If you do not have the password, please call the hospital operator.  09/30/2021, 10:29 AM

## 2021-09-30 NOTE — Consult Note (Addendum)
Chief Complaint: Patient was seen in consultation today for R [facility] placement and RP Erik bx  Chief Complaint  Patient presents with   Abnormal Lab   Rectal Bleeding   at the request of Louis Meckel, B.   Referring Physician(s): Louis Meckel, B.   Supervising Physician: Daryll Brod  Patient Status: [facility] - In-pt  History of Present Illness: Erik Page is a 71 y.o. male with PMHs of HTN and MI on ASA 81 mg who presented to [facility] ED due to weakness and weight loss, back pain and anemia.  Patient underwent CT AP w on 09/28/21 which showed:  1. Enlarged prostate gland with nodular soft tissue infiltration into the surrounding periprosthetic fat. Signs of bladder wall involvement are identified. Findings are worrisome for prostate cancer with tumor extension into the surrounding soft tissues, diffuse nodal metastasis, and widespread osseous metastatic disease. 2. The right kidney is markedly edematous with signs of severe hydronephrosis and hydroureter. The distal right ureter is likely obstructed by either pelvic adenopathy or tumor invasion at the right UVJ. 3. Gallstones. 4. There is a linear hypodensity within the inferior spleen. Although no secondary signs of acute splenic injury there is adjacent subacute to chronic left rib fractures. Findings may reflect sequelae of old splenic injury. Correlate for any clinical history of trauma to the left upper quadrant abdominal wall. 5. Aortic Atherosclerosis (ICD10-I70.0).  Patient was hospitalized for further evaluation and management and urology was consulted. Urology recommended R PCN placement as well as LNA biopsy to the patient, after thorough discussion and shared decision making, patient decided to proceed.   Patient laying in bed, not in acute distress.  Patient states that he is scared as he was never been sick like this, even when he had a heart attack.  Informed the patient that he is in very good shape hands, IR will do our best to  take good care of him.  Patient reports right-sided rib/back pain today, he states that his nausea has subsided.  Denise headache, fever, chills, shortness of breath, cough, chest pain,  nausea ,vomiting, and bleeding.     Past Medical History:  Diagnosis Date   HTN (hypertension)    Myocardial infarction [facility])     Past Surgical History:  Procedure Laterality Date   CARDIAC CATHETERIZATION N/A 08/15/2015   Procedure: Left Heart Cath and Coronary Angiography;  Surgeon: Leonie Man, MD;  Location: [place] CV LAB;  Service: Cardiovascular;  Laterality: N/A;   CARDIAC CATHETERIZATION N/A 08/15/2015   Procedure: Left Heart Cath and Coronary Angiography;  Surgeon: Leonie Man, MD;  Location: [place] CV LAB;  Service: Cardiovascular;  Laterality: N/A;   CARDIAC CATHETERIZATION N/A 08/15/2015   Procedure: Coronary Stent Intervention;  Surgeon: Leonie Man, MD;  Location: [place] CV LAB;  Service: Cardiovascular;  Laterality: N/A;   TONSILLECTOMY      Allergies: Patient has no known allergies.  Medications: Prior to Admission medications   Medication Sig Start Date End Date Taking? Authorizing Provider  aspirin EC 81 MG EC tablet Take 1 tablet (81 mg total) by mouth daily. 08/16/15  Yes Lyda Jester M, PA-C  atorvastatin (LIPITOR) 80 MG tablet Take 1 tablet (80 mg total) by mouth daily. TAKE ONE TABLET BY MOUTH DAILY  **MUST CALL MD FOR APPOINTMENT Patient taking differently: Take 80 mg by mouth daily. 09/19/21  Yes BranchAlphonse Guild, MD  ibuprofen (ADVIL) 200 MG tablet Take 800 mg by mouth every 6 (six) hours as needed  for moderate pain.   Yes [provider]  losartan (COZAAR) 25 MG tablet Take 0.5 tablets (12.5 mg total) by mouth daily. TAKE ONE-HALF TABLET BY MOUTH DAILY ( NEEDS TO BE SEEN) Patient taking differently: Take 12.5 mg by mouth daily. 09/19/21  Yes Branch, Alphonse Guild, MD  metoprolol tartrate (LOPRESSOR) 25 MG tablet Take 1 tablet (25  mg total) by mouth 2 (two) times daily. Patient taking differently: Take 25 mg by mouth daily. 09/19/21  Yes Branch, Alphonse Guild, MD  nitroGLYCERIN (NITROSTAT) 0.4 MG SL tablet DISSOLVE 1 TAB UNDER TONGUE FOR CHEST PAIN - IF PAIN REMAINS AFTER 5 MIN, CALL 911 AND REPEAT DOSE. MAX 3 TABS IN 15 MINUTES Patient taking differently: 0.4 mg every 5 (five) minutes as needed for chest pain. 09/19/21  Yes Branch, Alphonse Guild, MD     Family History  Problem Relation Age of Onset   Hypertension Other     Social History   Socioeconomic History   Marital status: Married    Spouse name: Not on file   Number of children: Not on file   Years of education: Not on file   Highest education level: Not on file  Occupational History   Not on file  Tobacco Use   Smoking status: Never   Smokeless tobacco: Never  Vaping Use   Vaping Use: Never used  Substance and Sexual Activity   Alcohol use: No    Alcohol/week: 0.0 standard drinks   Drug use: No   Sexual activity: Not on file  Other Topics Concern   Not on file  Social History Narrative   Works as a Engineer, materials; 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  All other systems are negative.  Vital Signs: BP 136/80 (BP Location: Left Arm)    Pulse 98    Temp 98 F (36.7 C) (Oral)    Resp 15    Ht 6\' 2"  (1.88 m)    Wt 211 lb 10.3 oz (96 kg)    SpO2 94%    BMI 27.17 kg/m   Physical Exam Vitals and nursing note reviewed.  Constitutional:      General: Patient is not in acute distress.    Appearance: Normal appearance. Patient is not ill-appearing.  HENT:     Head: Normocephalic and atraumatic.     Mouth/Throat:     Mouth: Mucous  membranes are moist.     Pharynx: Oropharynx is clear.  Cardiovascular:     Rate and Rhythm: Normal rate and regular rhythm.     Pulses: Normal pulses.     Heart sounds: Normal heart sounds.  Pulmonary:     Effort: Pulmonary effort is normal.     Breath sounds: Normal breath sounds.  Abdominal:     General: Abdomen is flat. Bowel sounds are normal.     Palpations: Abdomen is soft.  Musculoskeletal:     Cervical back: Neck supple.  Skin:    General: Skin is warm and dry.     Coloration: Skin is not jaundiced or pale.  Neurological:     Mental Status: Patient is alert and oriented to  person, place, and time.  Psychiatric:        Mood and Affect: Mood normal.        Behavior: Behavior normal.        Judgment: Judgment normal.     MD Evaluation Airway: WNL Heart: WNL Abdomen: WNL Chest/ Lungs: WNL ASA  Classification: 3 Mallampati/Airway Score: Two  Imaging: DG Chest 2 View  Result Date: 09/28/2021 CLINICAL DATA:  shortness of breath EXAM: CHEST - 2 VIEW COMPARISON:  None. FINDINGS: The heart and mediastinal contours are within normal limits. Elevated left hemidiaphragm. No focal consolidation. Increased interstitial markings. No pleural effusion. No pneumothorax. No acute osseous abnormality. IMPRESSION: Increased interstitial markings. Query underlying infection/inflammation. Electronically Signed   By: Iven Finn M.D.   On: 09/28/2021 15:54   CT Abdomen Pelvis W Contrast  Result Date: 09/28/2021 CLINICAL DATA:  Evaluate for bowel perforation. Abdominal pain, nonlocalized. Complains of weakness and rectal bleeding. EXAM: CT ABDOMEN AND PELVIS WITH CONTRAST TECHNIQUE: Multidetector CT imaging of the abdomen and pelvis was performed using the standard protocol following bolus administration of intravenous contrast. CONTRAST:  17mL OMNIPAQUE IOHEXOL 350 MG/ML SOLN COMPARISON:  None. FINDINGS: Lower chest: No acute abnormality. Hepatobiliary: No focal liver abnormality is seen.  Tiny stones are noted layering within the dependent portion of the gallbladder measuring up to 3 mm. No signs of gallbladder wall inflammation or bile duct dilatation. Pancreas: Unremarkable. No pancreatic ductal dilatation or surrounding inflammatory changes. Spleen: There is a small linear hypodensity within the posterior spleen measuring 1.3 cm. This is of uncertain clinical significance. Signs of adjacent rib fractures are identified which appears subacute to chronic. Adrenals/Urinary Tract: The adrenal glands are normal. Small left kidney cysts are identified. Left kidney is otherwise unremarkable. The right kidney appears diffusely edematous and there is severe right hydronephrosis and hydroureter. Markedly enlarged prostate gland is identified with mass effect upon the base of bladder with suspected tumor infiltration along the posterior wall of bladder, image 91/6. Stomach/Bowel: Stomach appears normal. The appendix is visualized and appears normal. No bowel wall thickening, inflammation, or distension. Vascular/Lymphatic: Aortic atherosclerosis without aneurysm. Extensive bilateral retroperitoneal and bilateral pelvic adenopathy identified compatible with metastatic disease. Index retrocaval lymph node measures 1.7 cm, image 35/2. Index left retroperitoneal lymph node measures 2.2 cm, image 38/2. Left common iliac lymph node is enlarged measuring 2 cm, image 55/2. Right pelvic sidewall lymph node measures 2.2 cm, image 71/2. Left pelvic sidewall lymph node measures 1.8 cm, image 72/2. Reproductive: The prostate gland is enlarged. The margins of the superior portions of the prostate gland are irregular with nodular soft tissue infiltration into the surrounding periprosthetic fat. Findings are worrisome for prostate cancer. Other: No free fluid or fluid collections identified within the abdomen or pelvis. Musculoskeletal: Widespread sclerotic metastases are identified throughout the visualized axial and  appendicular skeleton. Degenerative disc disease noted within the lumbar spine. IMPRESSION: 1. Enlarged prostate gland with nodular soft tissue infiltration into the surrounding periprosthetic fat. Signs of bladder wall involvement are identified. Findings are worrisome for prostate cancer with tumor extension into the surrounding soft tissues, diffuse nodal metastasis, and widespread osseous metastatic disease. 2. The right kidney is markedly edematous with signs of severe hydronephrosis and hydroureter. The distal right ureter is likely obstructed by either pelvic adenopathy or tumor invasion at the right UVJ. 3. Gallstones. 4. There is a linear hypodensity within the inferior spleen. Although no secondary signs of acute splenic injury there is adjacent subacute to chronic left rib fractures.  Findings may reflect sequelae of old splenic injury. Correlate for any clinical history of trauma to the left upper quadrant abdominal wall. 5. Aortic Atherosclerosis (ICD10-I70.0). Electronically Signed   By: Kerby Moors M.D.   On: 09/28/2021 17:54   CT L-SPINE NO CHARGE  Result Date: 09/28/2021 CLINICAL DATA:  Weakness, rectal bleeding, nausea, lumbar pain EXAM: CT LUMBAR SPINE WITHOUT CONTRAST TECHNIQUE: Multidetector CT imaging of the lumbar spine was performed without intravenous contrast administration. Multiplanar CT image reconstructions were also generated. COMPARISON:  None. FINDINGS: Segmentation: 5 lumbar type vertebrae. Alignment: Levocurvature of the lumbar spine. Straightening of the normal lumbar lordosis. Trace retrolisthesis L5 on S1. Vertebrae: Diffuse sclerotic lesions throughout the imaged spine and pelvis, concerning for diffuse osseous metastatic disease. No acute fracture. Vertebral body heights are preserved. Paraspinal and other soft tissues: Please see same-day CT abdomen pelvis. Disc levels: T12-L1: No significant disc bulge. No spinal canal stenosis or neural foraminal narrowing. L1-L2: No  significant disc bulge. No spinal canal stenosis or neural foraminal narrowing. L2-L3: Mild disc bulge. Moderate facet arthropathy. No spinal canal stenosis or neural foraminal narrowing. L3-L4: Large disc bulge. Severe right and mild left facet arthropathy. Moderate spinal canal stenosis. Severe right neural foraminal narrowing. L4-L5: Moderate disc bulge. Moderate to severe right and mild left facet arthropathy. No spinal canal stenosis. Moderate to severe right neural foraminal narrowing. L5-S1: Trace retrolisthesis and mild disc bulge. Moderate facet arthropathy. No spinal canal stenosis. Mild right and moderate left neural foraminal narrowing. IMPRESSION: 1. Diffuse sclerotic lesions throughout the imaged spine and pelvis, concerning for diffuse osseous metastatic disease. 2. No acute vertebral body fracture. 3. L3-L4 moderate spinal canal stenosis and severe right neural foraminal narrowing. 4. L4-L5 moderate to severe right neural foraminal narrowing. 5. L5-S1 moderate left and mild right neural foraminal narrowing. Electronically Signed   By: Merilyn Baba M.D.   On: 09/28/2021 17:52    Labs:  CBC: Recent Labs    09/28/21 1607 09/29/21 0602 09/30/21 0553  WBC 5.3 4.6 5.3  HGB 7.7* 6.3* 7.5*  HCT 23.9* 19.9* 23.1*  PLT 154 140* 136*    COAGS: No results for input(s): INR, APTT in the last 8760 hours.  BMP: Recent Labs    09/28/21 1607 09/29/21 0602 09/30/21 0553  NA 136 135 136  K 4.1 4.2 3.9  CL 104 100 102  CO2 22 24 24   GLUCOSE 113* 133* 106*  BUN 18 16 19   CALCIUM 8.2* 8.2* 7.7*  CREATININE 1.24 1.15 1.22  GFRNONAA >60 >60 >60    LIVER FUNCTION TESTS: Recent Labs    09/28/21 1607 09/29/21 0602 09/30/21 0553  BILITOT 1.8* 1.7* 1.7*  AST 29 22 22   ALT 20 17 15   ALKPHOS 358* 283* 244*  PROT 7.2 6.2* 6.1*  ALBUMIN 2.9* 2.5* 2.4*    TUMOR MARKERS: No results for input(s): AFPTM, CEA, CA199, CHROMGRNA in the last 8760 hours.  Assessment and Plan: 71 y.o.  male with enlarged prostate with imaging features worrisome for prostrate CA with tumor extension into the surrounding soft tissues, diffuse nodal metastasis, and widespread osseous metastatic disease, and right hydronephrosis and hydro ureter.Urology was consulted and recommended R PCN placement as well as biopsy of LNA.   Case was reviewed and approved for fluoro quided right PCN placement and CT guided RP Erik biopsy by Dr. Annamaria Boots.   The procedure is tentatively scheduled for Monday pending IR schedule.  Informed the patient that radiology may not be able to accommodate both  procedures tomorrow.  Patient verbalized understanding.  Made NPO at MN Monday  VSS CBC hgb 7.5, plt 136  Ceftriaxone 2g ordered, to be given in IR.   No INR sicne 2016, ordered to be obtained with AM labs tomorrow.  Not on AC/AP in house, last ASA 81 mg at home was Thursday 09/27/21.   Thank you for this interesting consult.  I greatly enjoyed meeting Erik Page and look forward to participating in their care.  A copy of this report was sent to the requesting provider on this date.  Electronically Signed: Tera Mater, PA-C 09/30/2021, 9:29 AM   I spent a total of 30 min    in face to face in clinical consultation, greater than 50% of which was counseling/coordinating care for R PCN and RP Erik biopsy.   This chart was dictated using voice recognition software.  Despite best efforts to proofread,  errors can occur which can change the documentation meaning.

## 2021-09-30 NOTE — Progress Notes (Signed)
OT Cancellation Note  Patient Details Name: Erik Page MRN: [id number] DOB: Jul 01, 1951   Cancelled Treatment:    Reason Eval/Treat Not Completed: Other (comment) Per nurse report, patient is tearful in pain this AM. Nurse asking for therapy to hold off at this time until pain is better controlled. OT to continue to follow and check back as schedule will allow.  Jackelyn Poling OTR/L, [place] [facility] Office# [phone] Pager# [phone]   09/30/2021, 7:58 AM

## 2021-10-01 ENCOUNTER — Ambulatory Visit
Admit: 2021-10-01 | Discharge: 2021-10-01 | Disposition: A | Discharge status: Home / Self Care | Payer: Medicare Other | Attending: Radiation Oncology | Admitting: Radiation Oncology

## 2021-10-01 ENCOUNTER — Inpatient Hospital Stay (HOSPITAL_COMMUNITY): Payer: Medicare Other

## 2021-10-01 DIAGNOSIS — D630 Anemia in neoplastic disease: Secondary | ICD-10-CM

## 2021-10-01 DIAGNOSIS — N1339 Other hydronephrosis: Secondary | ICD-10-CM | POA: Diagnosis not present

## 2021-10-01 DIAGNOSIS — C61 Malignant neoplasm of prostate: Principal | ICD-10-CM

## 2021-10-01 DIAGNOSIS — C7951 Secondary malignant neoplasm of bone: Secondary | ICD-10-CM

## 2021-10-01 HISTORY — PX: IR NEPHROSTOMY PLACEMENT RIGHT: IMG6064

## 2021-10-01 LAB — TYPE AND SCREEN
ABO/RH(D): A POS
Antibody Screen: NEGATIVE
Unit division: 0

## 2021-10-01 LAB — BPAM RBC
Blood Product Expiration Date: 202301252359
ISSUE DATE / TIME: 202301071005
Unit Type and Rh: 6200

## 2021-10-01 LAB — CBC WITH DIFFERENTIAL/PLATELET
Abs Immature Granulocytes: 0.17 10*3/uL — ABNORMAL HIGH (ref 0.00–0.07)
Basophils Absolute: 0 10*3/uL (ref 0.0–0.1)
Basophils Relative: 1 %
Eosinophils Absolute: 0.1 10*3/uL (ref 0.0–0.5)
Eosinophils Relative: 2 %
HCT: 25.4 % — ABNORMAL LOW (ref 39.0–52.0)
Hemoglobin: 8.2 g/dL — ABNORMAL LOW (ref 13.0–17.0)
Immature Granulocytes: 4 %
Lymphocytes Relative: 26 %
Lymphs Abs: 1.2 10*3/uL (ref 0.7–4.0)
MCH: 29.5 pg (ref 26.0–34.0)
MCHC: 32.3 g/dL (ref 30.0–36.0)
MCV: 91.4 fL (ref 80.0–100.0)
Monocytes Absolute: 0.4 10*3/uL (ref 0.1–1.0)
Monocytes Relative: 8 %
Neutro Abs: 2.7 10*3/uL (ref 1.7–7.7)
Neutrophils Relative %: 59 %
Platelets: 156 10*3/uL (ref 150–400)
RBC: 2.78 MIL/uL — ABNORMAL LOW (ref 4.22–5.81)
RDW: 15.7 % — ABNORMAL HIGH (ref 11.5–15.5)
WBC: 4.5 10*3/uL (ref 4.0–10.5)
nRBC: 2 % — ABNORMAL HIGH (ref 0.0–0.2)

## 2021-10-01 LAB — COMPREHENSIVE METABOLIC PANEL
ALT: 16 U/L (ref 0–44)
AST: 22 U/L (ref 15–41)
Albumin: 2.4 g/dL — ABNORMAL LOW (ref 3.5–5.0)
Alkaline Phosphatase: 259 U/L — ABNORMAL HIGH (ref 38–126)
Anion gap: 11 (ref 5–15)
BUN: 18 mg/dL (ref 8–23)
CO2: 25 mmol/L (ref 22–32)
Calcium: 8 mg/dL — ABNORMAL LOW (ref 8.9–10.3)
Chloride: 103 mmol/L (ref 98–111)
Creatinine, Ser: 1.14 mg/dL (ref 0.61–1.24)
GFR, Estimated: >60 mL/min (ref >60)
Glucose, Bld: 125 mg/dL — ABNORMAL HIGH (ref 70–99)
Potassium: 3.7 mmol/L (ref 3.5–5.1)
Sodium: 139 mmol/L (ref 135–145)
Total Bilirubin: 1.4 mg/dL — ABNORMAL HIGH (ref 0.3–1.2)
Total Protein: 6.2 g/dL — ABNORMAL LOW (ref 6.5–8.1)

## 2021-10-01 LAB — PROTIME-INR
INR: 1.3 — ABNORMAL HIGH (ref 0.8–1.2)
Prothrombin Time: 16.2 seconds — ABNORMAL HIGH (ref 11.4–15.2)

## 2021-10-01 IMAGING — MR MR THORACIC SPINE WO/W CM
9 of 10 series · 36 of 48 positions shown · IV contrast (gadavist)
Comparison: None available.

CLINICAL DATA: Initial evaluation for back pain, history of
prostate cancer.

EXAM:
MRI THORACIC WITHOUT AND WITH CONTRAST
TECHNIQUE: Multiplanar and multiecho pulse sequences of the thoracic spine were
obtained without and with intravenous contrast.
CONTRAST:  10mL GADAVIST GADOBUTROL 1 MMOL/ML IV SOLN

[Series 16: T1 · sagittal · 4.0mm · 1.88mm/px · 1 of 5 slices shown (1 of 4)]
[im 1/5]
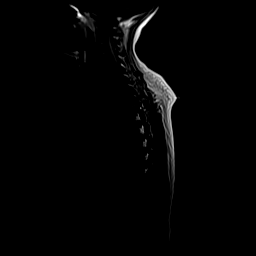

[Series 17: STIR · sagittal · 3.0mm · 1.25mm/px · 4 of 15 slices shown]
[im 1/15]
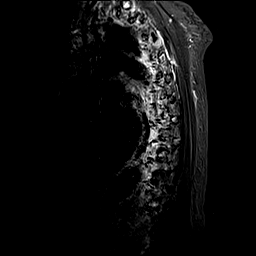
[im 5/15]
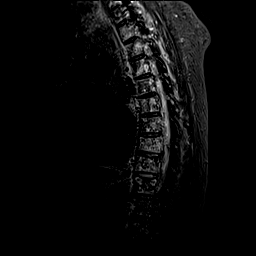
[im 10/15]
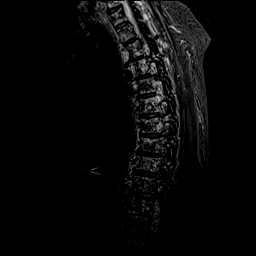
[im 15/15]
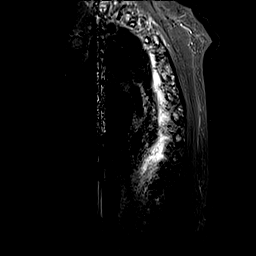

[Series 18: T1 · sagittal · 3.0mm · 1.25mm/px · 4 of 15 slices shown (2 of 4)]
[im 1/15]
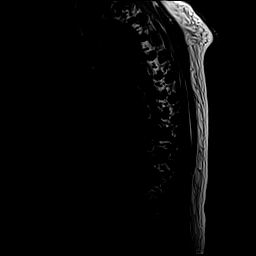
[im 5/15]
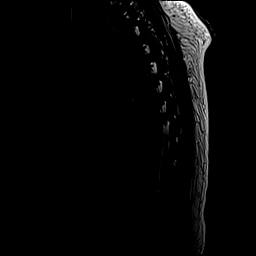
[im 10/15]
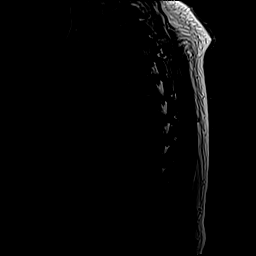
[im 15/15]
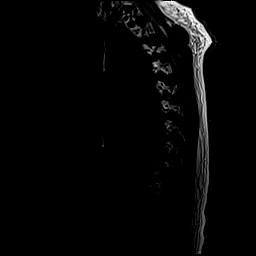

[Series 19: T1 · axial · 4.0mm · 0.39mm/px · z∈[-308,-34]mm · 5 of 15 slices shown (3 of 4)]
[im 1/15]
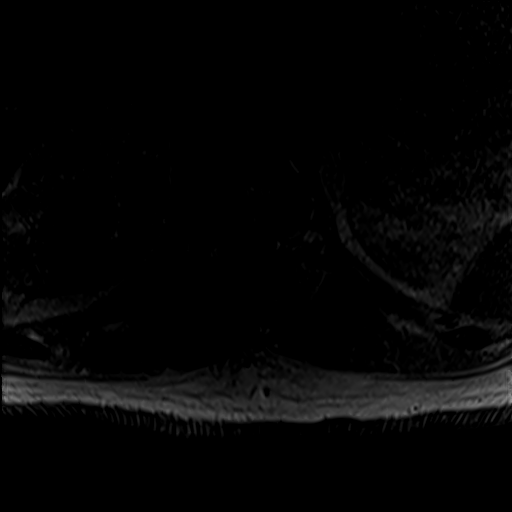
[im 4/15]
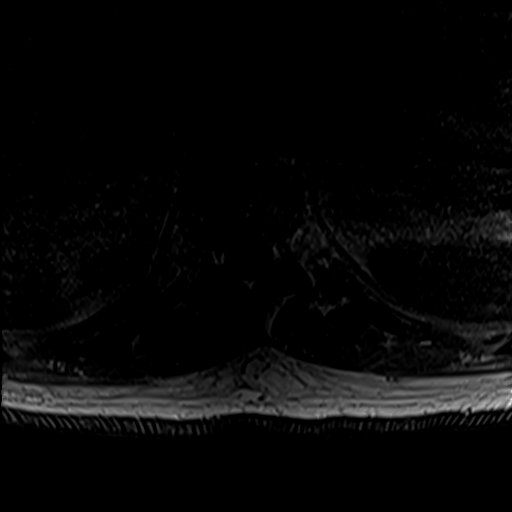
[im 8/15]
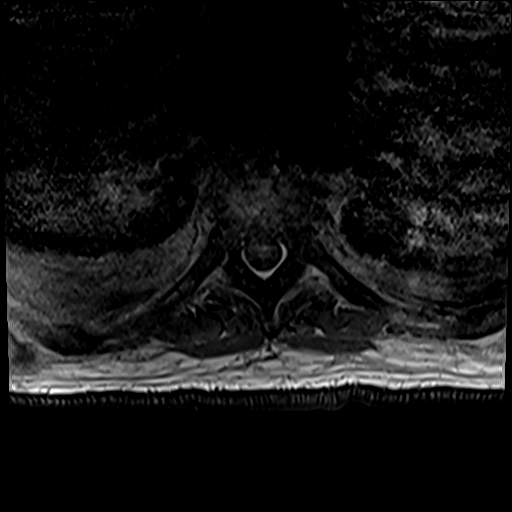
[im 11/15]
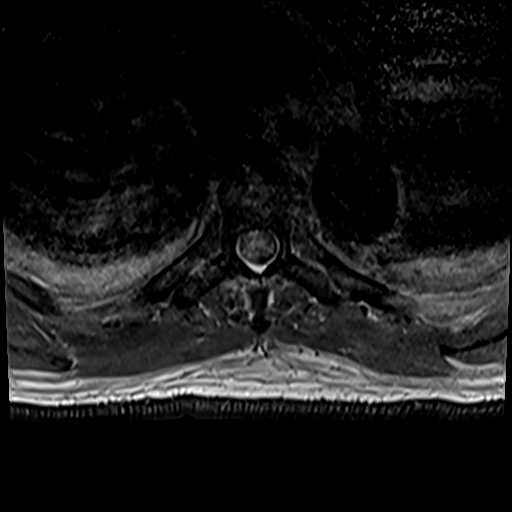
[im 15/15]
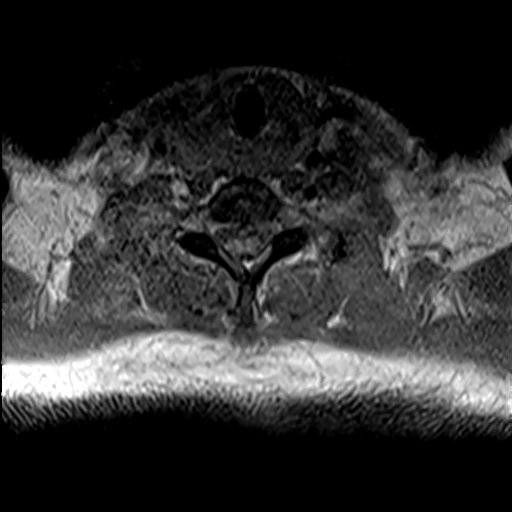

[Series 20: T2 · axial · 4.0mm · 0.78mm/px · z∈[-308,-34]mm · 5 of 15 slices shown]
[im 1/15]
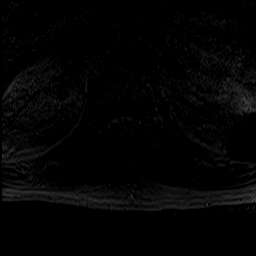
[im 4/15]
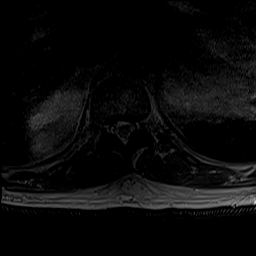
[im 8/15]
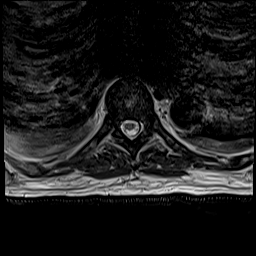
[im 11/15]
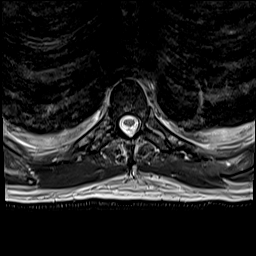
[im 15/15]
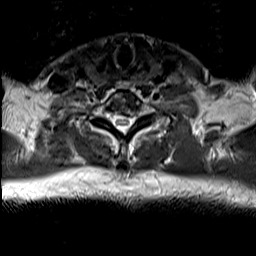

[Series 60: T1 · sagittal · 4.0mm · 1.72mm/px · 2 of 5 slices shown (4 of 4)]
[im 1/5]
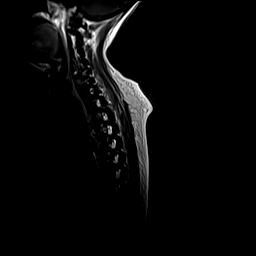
[im 5/5]
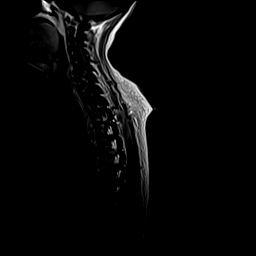

[Series 61: T2 post-contrast · sagittal · 3.0mm · 1.00mm/px · 5 of 15 slices shown]
[im 1/15]
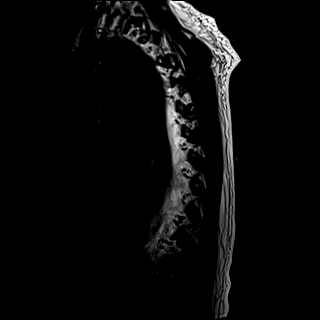
[im 4/15]
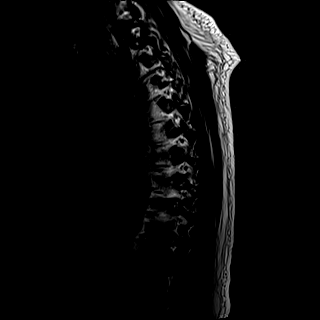
[im 8/15]
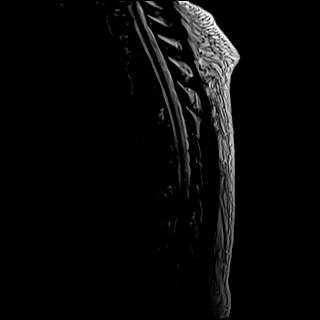
[im 11/15]
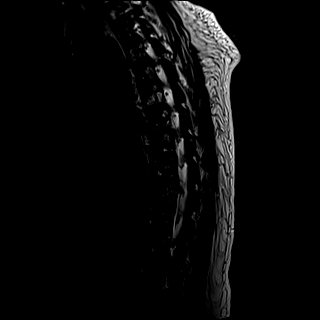
[im 15/15]
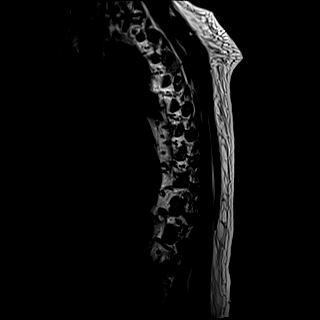

[Series 62: T1 fat-sat post-contrast · sagittal · 3.0mm · 1.25mm/px · 5 of 15 slices shown]
[im 1/15]
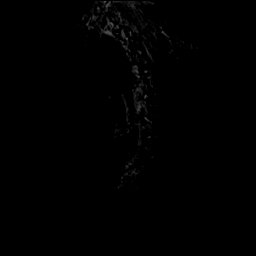
[im 4/15]
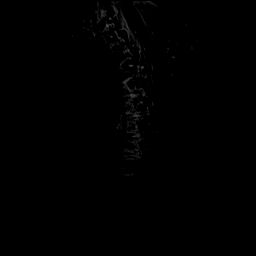
[im 8/15]
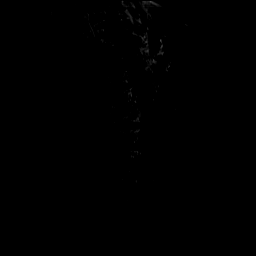
[im 11/15]
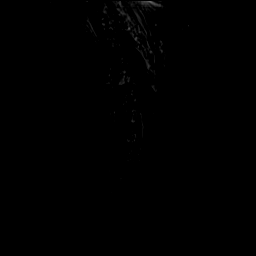
[im 15/15]
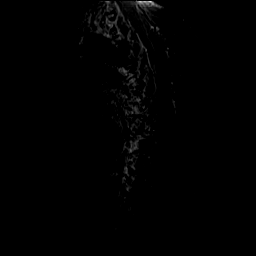

[Series 63: T1 post-contrast · axial · 4.0mm · 0.39mm/px · z∈[-332,-173]mm · 5 of 39 slices shown]
[im 1/39]
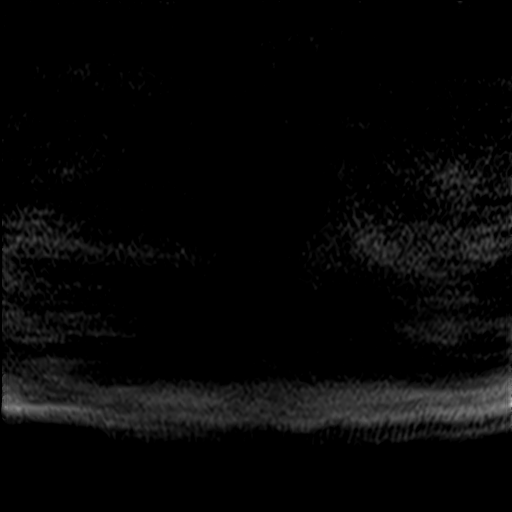
[im 7/39]
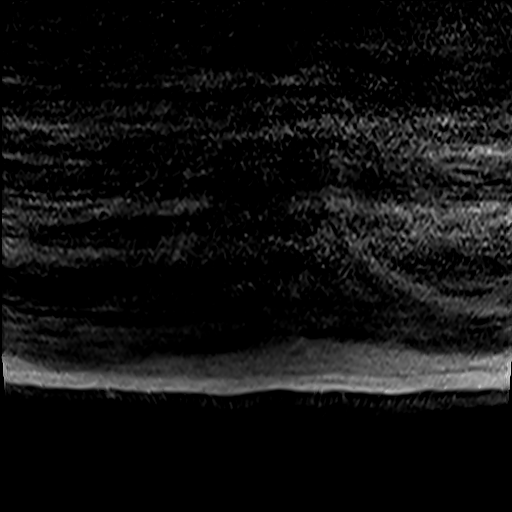
[im 11/39]
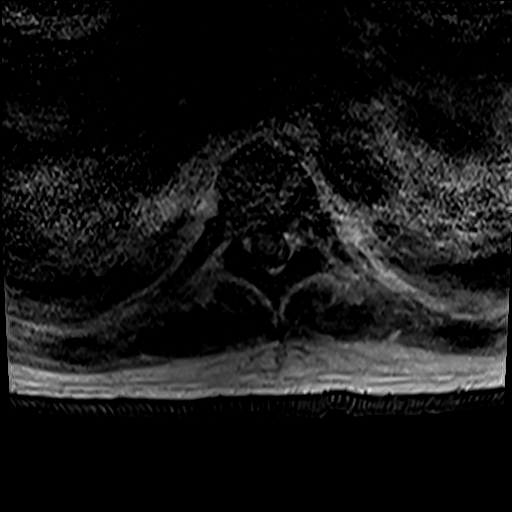
[im 18/39]
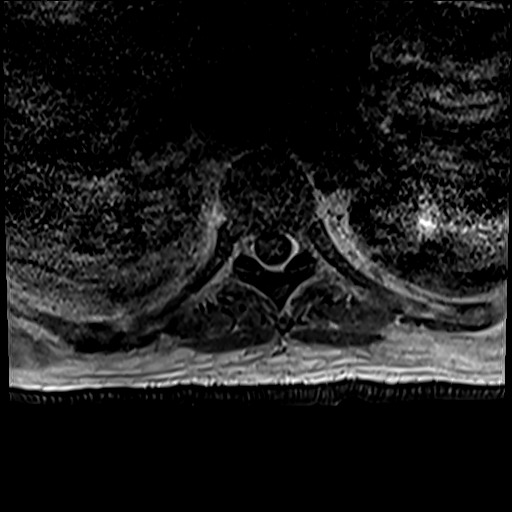
[im 21/39]
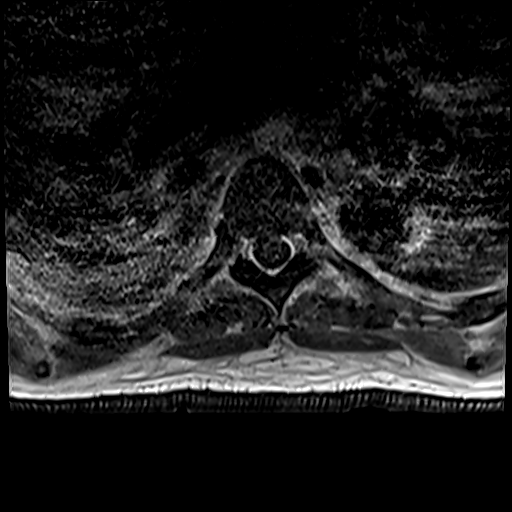

[36 of 48 positions shown; findings below may reference images not displayed]

FINDINGS: Alignment:  Examination degraded by motion artifact.

Vertebral bodies normally aligned with preservation of the normal
thoracic kyphosis. No listhesis.

Vertebrae: Diffusely abnormal heterogeneous signal intensity seen
throughout the visualized bone marrow, consistent with widespread
osseous metastatic disease. Associated heterogeneous postcontrast
enhancement. Measurement of a discrete lesion is difficult given the
diffusely infiltrative nature of this finding. No visible extra
osseous or epidural extension of tumor. Mild chronic height loss
noted at the superior endplate of T8. No acute pathologic fracture
or other complication.

Cord: Normal signal and morphology. No visible intracanalicular or
epidural tumor. No abnormal enhancement.

Paraspinal and other soft tissues: Paraspinous soft tissues
demonstrate no acute finding. Small to moderate layering bilateral
pleural effusions, right larger than left. Right-sided
hydronephrosis partially visualized.

Disc levels:

T6-7: Central to left paracentral disc protrusion indents the
ventral thecal sac, contacting and mildly flattening the ventral
cord (series 20, image 7). No significant spinal stenosis. Foramina
remain patent.

T8-9: Central disc protrusion indents the ventral thecal sac,
contacting and mildly flattening the ventral cord, but with no cord
signal changes or significant spinal stenosis. Foramina remain
patent.

Otherwise, no other significant disc pathology seen elsewhere within
the thoracic spine. No other significant spinal stenosis. Foramina
remain patent.
IMPRESSION: 1. Widespread osseous metastatic disease throughout the thoracic
spine. No extra osseous or epidural extension of tumor. No acute
pathologic fracture or other complication.
2. Central to left paracentral disc protrusions at T6-7 and T8-9
without significant stenosis.
3. Small to moderate layering bilateral pleural effusions, right
larger than left.
4. Right-sided hydronephrosis, partially visualized.

## 2021-10-01 IMAGING — MR MR LUMBAR SPINE WO/W CM
6 of 7 series · 30 of 48 positions shown · IV contrast (gadavist)
Comparison: Prior CTs from [DATE].

CLINICAL DATA: Initial evaluation for metastatic disease. Neck
pain, history of prostate cancer.

EXAM:
MRI LUMBAR SPINE WITHOUT AND WITH CONTRAST
TECHNIQUE: Multiplanar and multiecho pulse sequences of the lumbar spine were
obtained without and with intravenous contrast.
CONTRAST:  10mL GADAVIST GADOBUTROL 1 MMOL/ML IV SOLN

[Series 9: T1 · sagittal · 4.0mm · 1.02mm/px · 5 of 17 slices shown (1 of 2)]
[im 1/17]
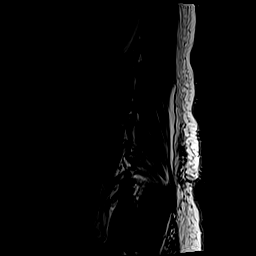
[im 5/17]
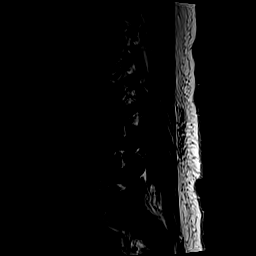
[im 9/17]
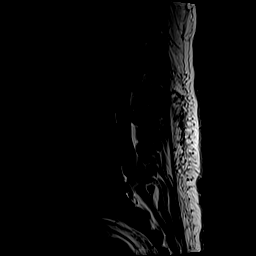
[im 13/17]
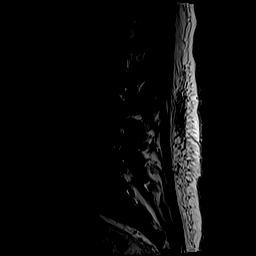
[im 17/17]
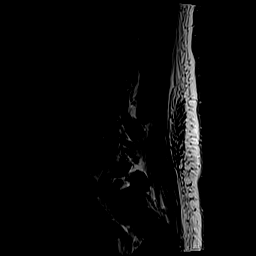

[Series 10: STIR · sagittal · 4.0mm · 0.51mm/px · 1 of 17 slices shown]
[im 1/17]
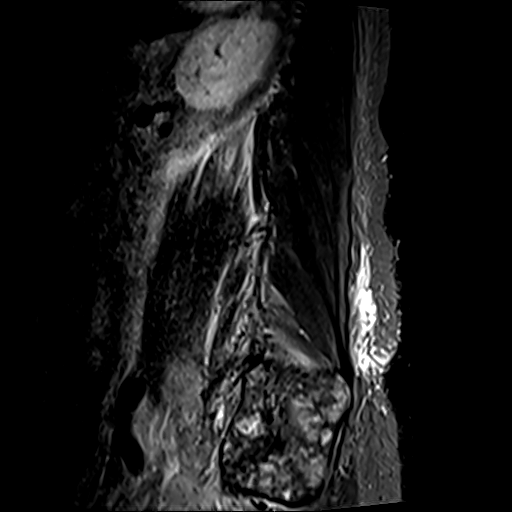

[Series 11: T2 · axial · 4.0mm · 0.78mm/px · z∈[-365,-134]mm · 8 of 43 slices shown]
[im 1/43]
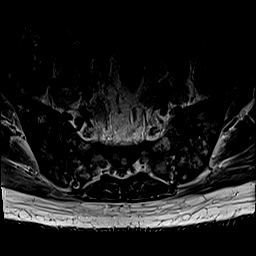
[im 5/43]
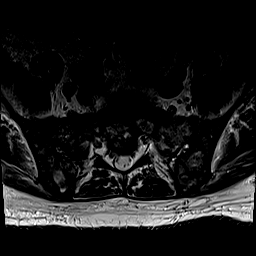
[im 15/43]
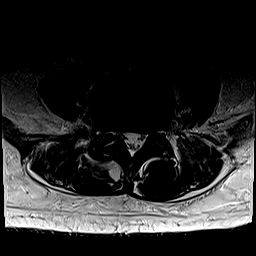
[im 19/43]
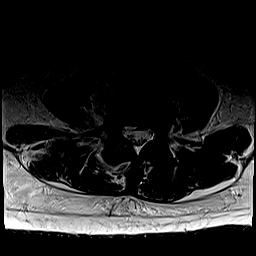
[im 24/43]
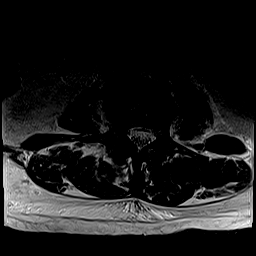
[im 29/43]
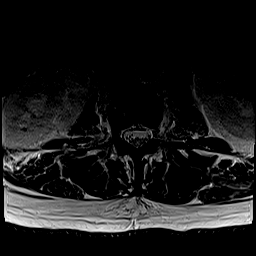
[im 38/43]
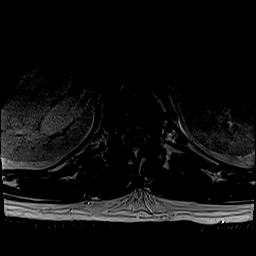
[im 43/43]
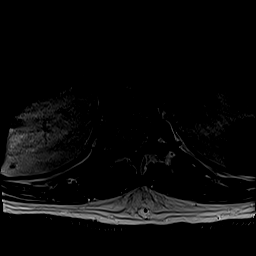

[Series 12: T1 · axial · 4.0mm · 0.39mm/px · z∈[-365,-134]mm · 8 of 43 slices shown (2 of 2)]
[im 1/43]
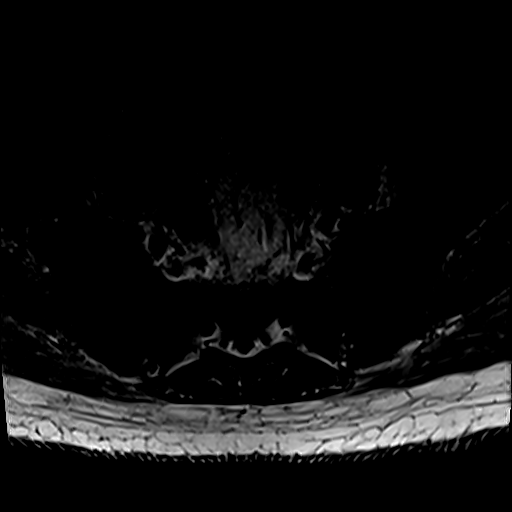
[im 5/43]
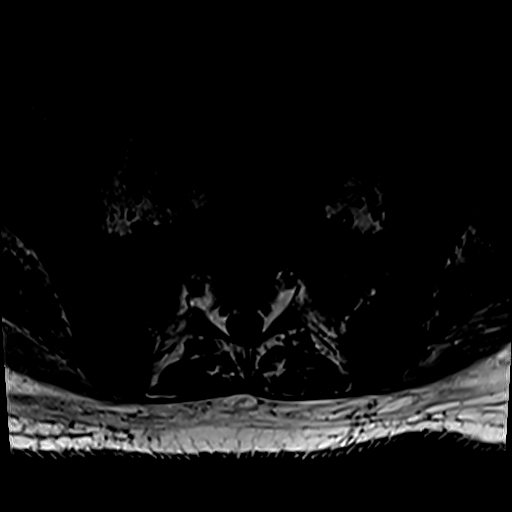
[im 15/43]
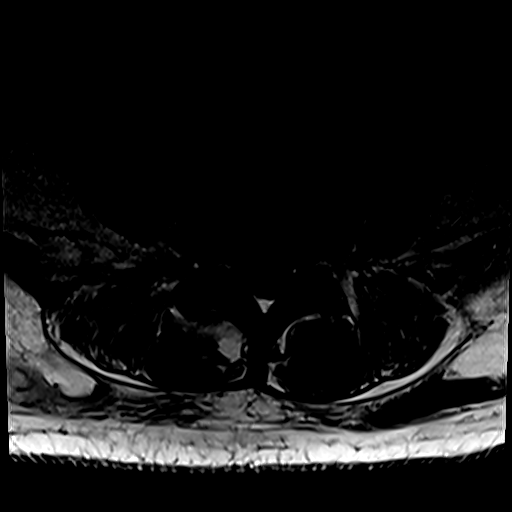
[im 19/43]
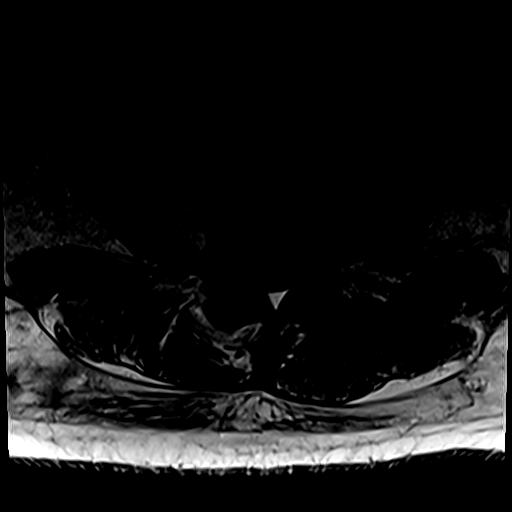
[im 24/43]
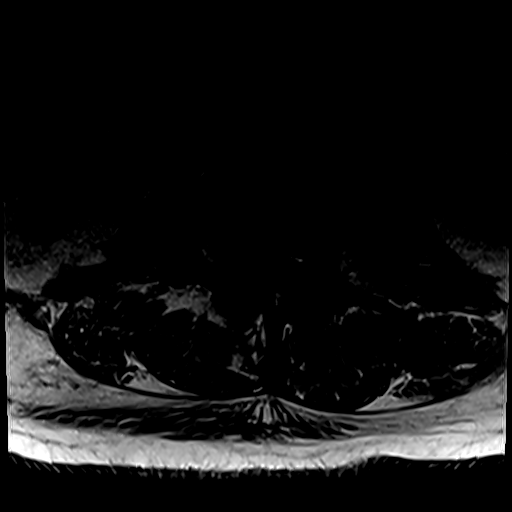
[im 29/43]
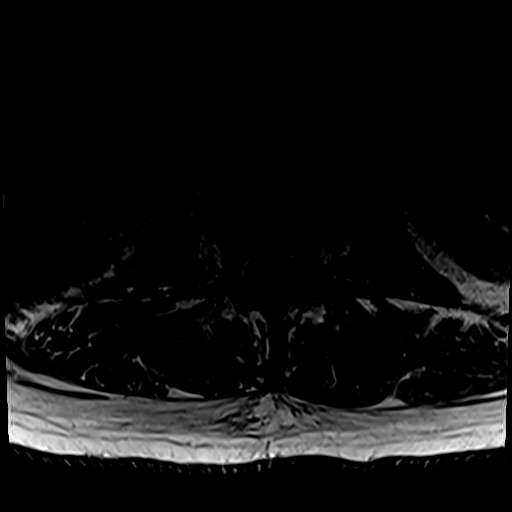
[im 38/43]
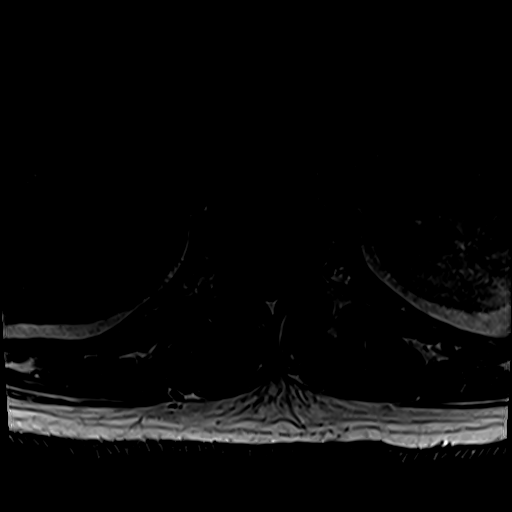
[im 43/43]
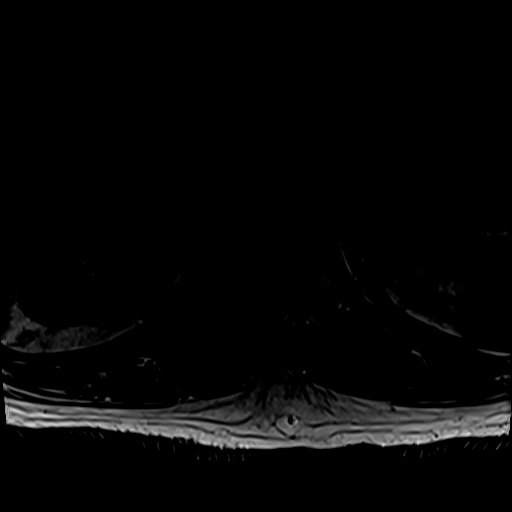

[Series 13: T2 post-contrast · sagittal · 4.0mm · 0.81mm/px · 4 of 17 slices shown]
[im 1/17]
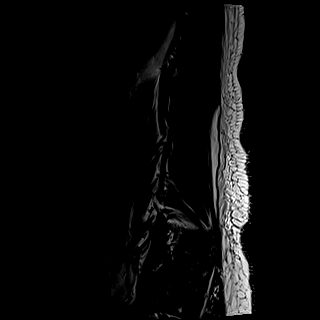
[im 6/17]
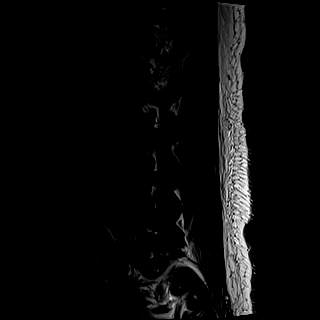
[im 11/17]
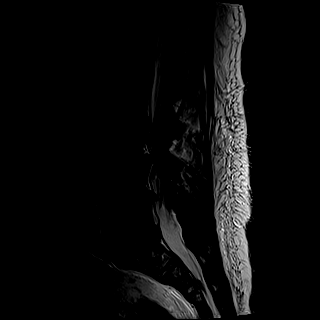
[im 17/17]
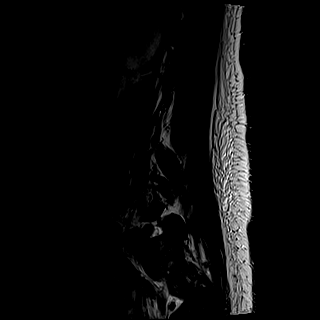

[Series 14: T1 fat-sat post-contrast · sagittal · 4.0mm · 1.02mm/px · 4 of 17 slices shown]
[im 1/17]
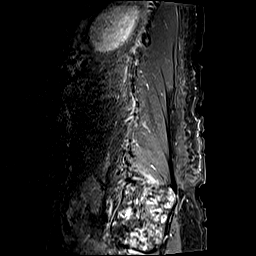
[im 6/17]
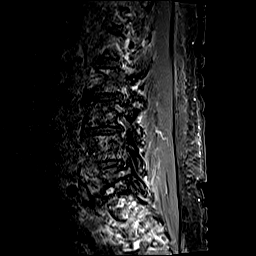
[im 11/17]
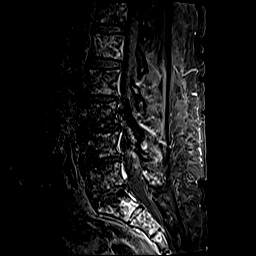
[im 17/17]
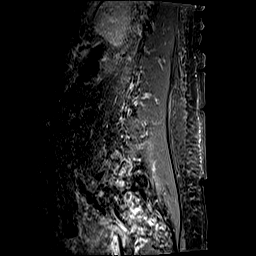

[30 of 48 positions shown; findings below may reference images not displayed]

FINDINGS: Segmentation: Standard. Lowest well-formed disc space labeled the
L5-S1 level.

Alignment: Mild sigmoid scoliotic curvature. Trace degenerative
anterolisthesis of L3 on L4, with trace retrolisthesis of L5 on S1.
Alignment otherwise normal preservation of the normal lumbar
lordosis.

Vertebrae: Diffusely abnormal heterogeneous appearance seen
throughout the visualized bone marrow, consistent with widespread
osseous metastatic disease. There is diffuse involvement of all
levels of the visualized lumbar spine, with diffuse heterogeneous
involvement of the visualized sacrum and pelvis as above. Vertebral
body height maintained without associated pathologic fracture. No
visible extra osseous or epidural extension of tumor.

Conus medullaris and cauda equina: Conus extends to the L1 level.
Conus and cauda equina appear normal. No intracanalicular or
epidural tumor.

Paraspinal and other soft tissues: Prominent retroperitoneal and
pelvic adenopathy, concerning for nodal metastatic disease.
Right-sided hydroureteronephrosis, partially visualized, better
characterized on prior CT. Few scattered superimposed renal cysts
noted bilaterally, a few of which demonstrates some intrinsic T1
hyperintensity. Largest of these measures 1.5 cm at the interpolar
region of the right kidney (series 12, image 9). Paraspinous soft
tissues demonstrate no other acute finding.

Disc levels:

L1-2:  Negative interspace.  Mild facet hypertrophy.  No stenosis.

L2-3: Degenerative intervertebral disc space narrowing with diffuse
disc bulge and reactive endplate change. Superimposed left foraminal
to extraforaminal disc protrusion closely approximates the exiting
left L2 nerve root (series 11, image 17). Mild to moderate bilateral
facet hypertrophy. Resultant mild narrowing of the right lateral
recess. Central canal remains patent. No significant foraminal
encroachment.

L3-4: Trace anterolisthesis. Degenerative intervertebral disc space
narrowing with diffuse disc bulge and disc desiccation. Superimposed
small right foraminal disc protrusion contacts the exiting right L3
nerve root (series 11, image 21). Moderate right with mild left
facet hypertrophy. Resultant mild canal with moderate right lateral
recess stenosis, with moderate right L3 foraminal narrowing. Left
neural foramina remains patent.

L4-5: Degenerative intervertebral disc space narrowing with diffuse
disc bulge and disc desiccation. Mild reactive endplate spurring.
Superimposed biforaminal disc protrusions contact and/or closely
approximates both of the exiting L4 nerve roots as they course
through the neural foramina (series 13, images 4, 13). Moderate
right with mild-to-moderate left facet hypertrophy. Resultant mild
narrowing of the right lateral recess. Mild bilateral L4 foraminal
stenosis.

L5-S1: Trace retrolisthesis. Degenerative intervertebral disc space
narrowing with diffuse disc bulge and disc desiccation. Associated
reactive endplate spurring, greater on the left. Superimposed
shallow central to left subarticular disc protrusion closely
approximates the descending left S1 nerve root (series 11, image
35). Mild to moderate bilateral facet hypertrophy. No significant
spinal stenosis. Mild right with moderate left L5 foraminal
stenosis.
IMPRESSION: 1. Widespread osseous metastatic disease throughout the lumbar spine
and visualized sacrum/pelvis. No associated pathologic fracture. No
extra osseous extension or epidural tumor identified.
2. Prominent retroperitoneal and pelvic adenopathy, concerning for
nodal metastatic disease.
3. Right-sided hydroureteronephrosis, grossly stable from prior CT
from [DATE].
[DATE]. Underlying moderate multilevel degenerative spondylosis with
small disc protrusions at L2-3 through L5-S1 as detailed above.
Resultant mild to moderate right lateral recess narrowing at L3-4
and L4-5.
5. Multiple scattered renal cysts, a few of which demonstrate
intrinsic T1 hyperintensity within the right kidney, indeterminate.
Further evaluation with dedicated renal mass protocol CT and/or MRI
suggested for complete evaluation.

## 2021-10-01 IMAGING — CT CT BIOPSY
2 of 4 series · 14 of 32 positions shown, 19 images · non-contrast
Comparison: none

INDICATION: 70-year-old gentleman with enlarged retroperitoneal lymph nodes
presents to IR for CT-guided biopsy.

[Series 3: i-spiral 5.0 bf37 · axial · 0.95mm/px · z∈[-276,-48]mm · 8 of 85 slices shown, 13 images (1 of 2)]
[im 10/85  soft-tissue]
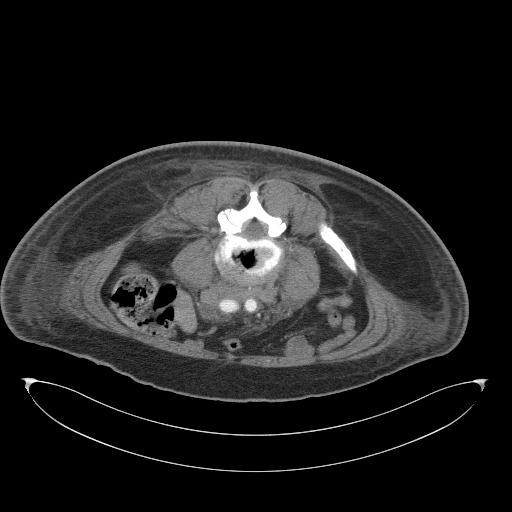
[im 10/85  bone]
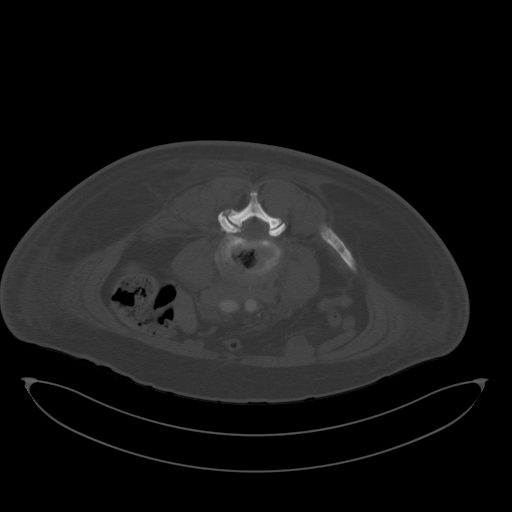
[im 19/85  soft-tissue]
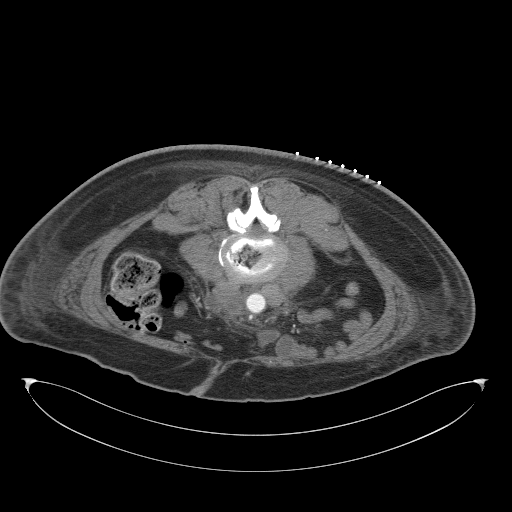
[im 29/85  soft-tissue]
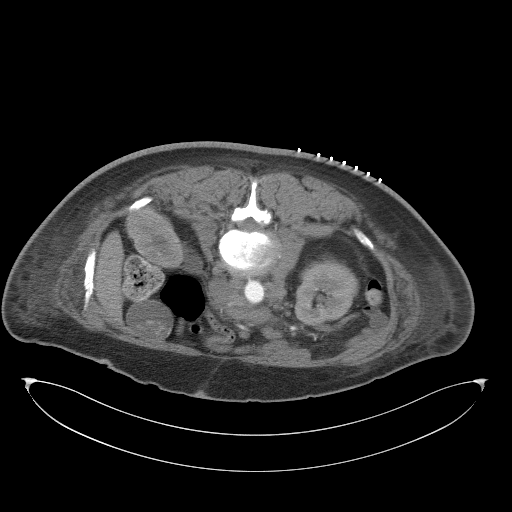
[im 38/85  soft-tissue]
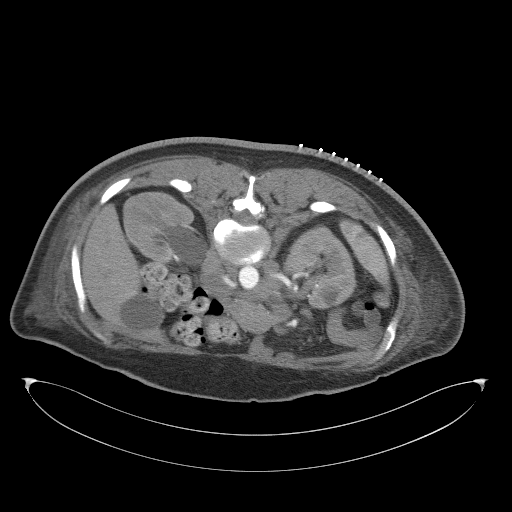
[im 47/85  soft-tissue]
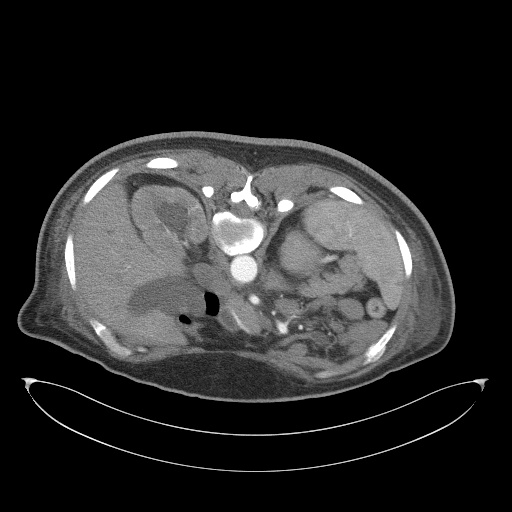
[im 47/85  lung]
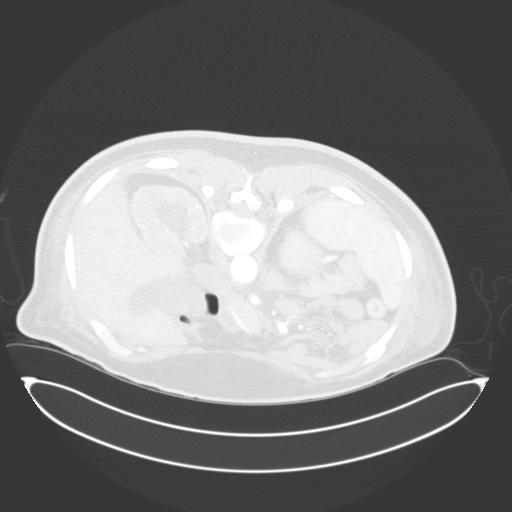
[im 57/85  soft-tissue]
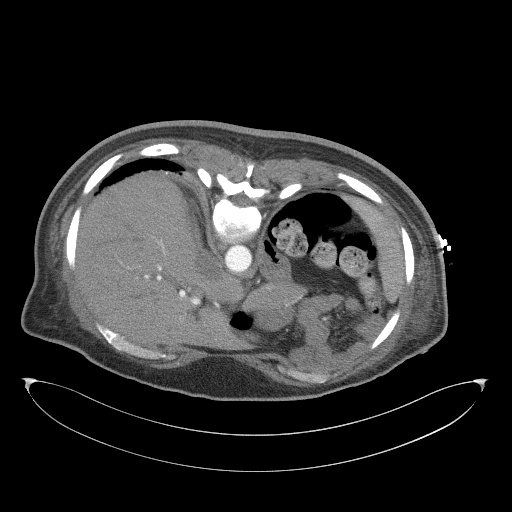
[im 57/85  lung]
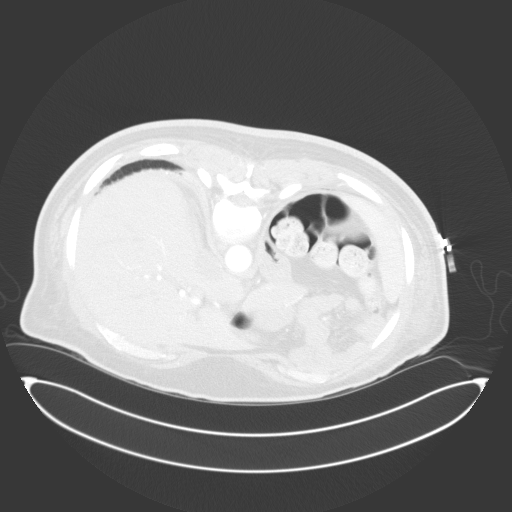
[im 66/85  soft-tissue]
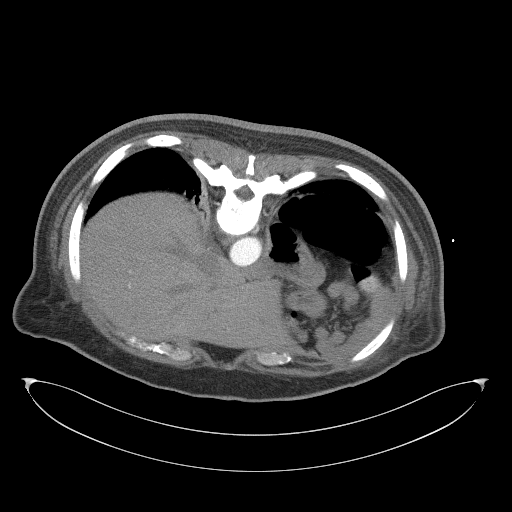
[im 66/85  lung]
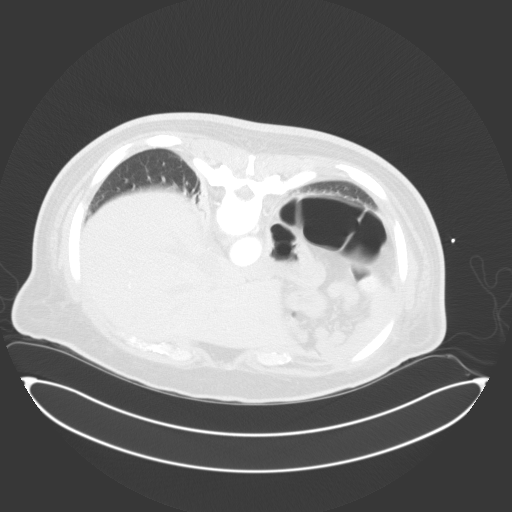
[im 75/85  soft-tissue]
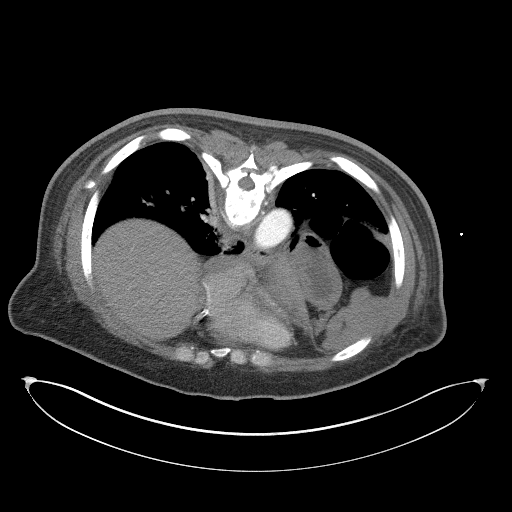
[im 75/85  lung]
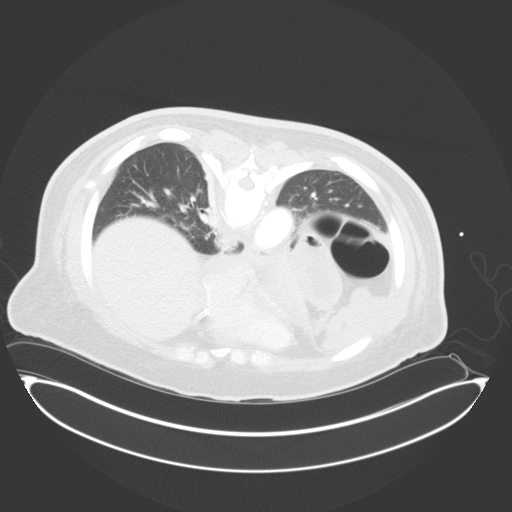

[Series 8: i-spiral 5.0 bf37 · axial · 0.95mm/px · z∈[-308,-150]mm · 6 of 83 slices shown (2 of 2)]
[im 10/83  soft-tissue]
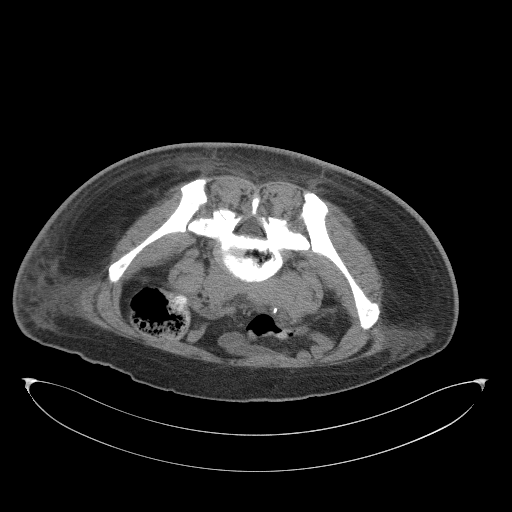
[im 19/83  soft-tissue]
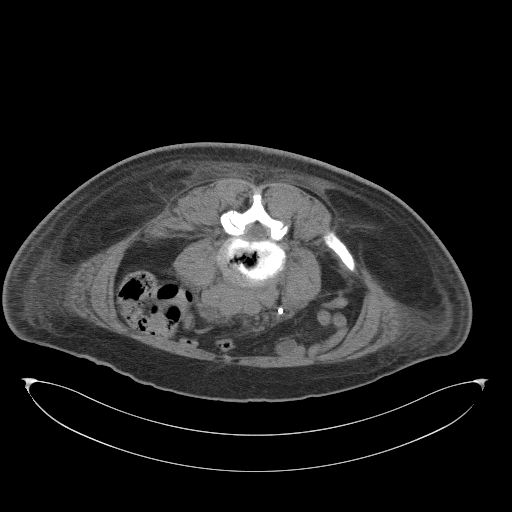
[im 28/83  soft-tissue]
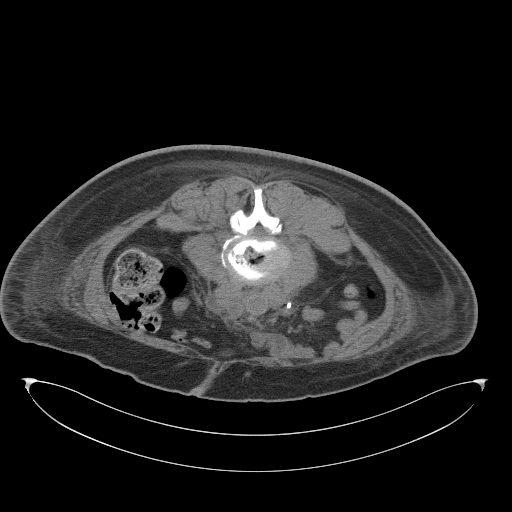
[im 37/83  soft-tissue]
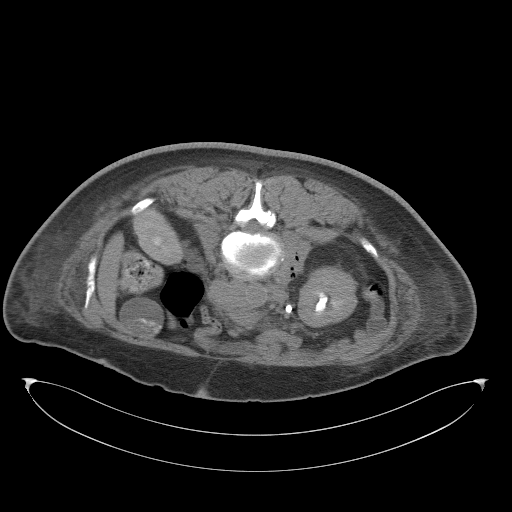
[im 46/83  soft-tissue]
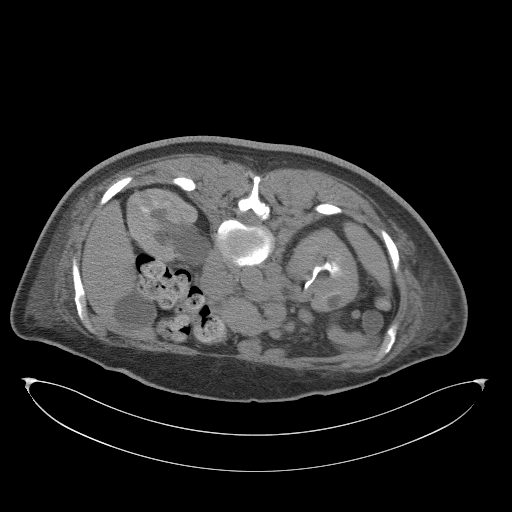
[im 55/83  soft-tissue]
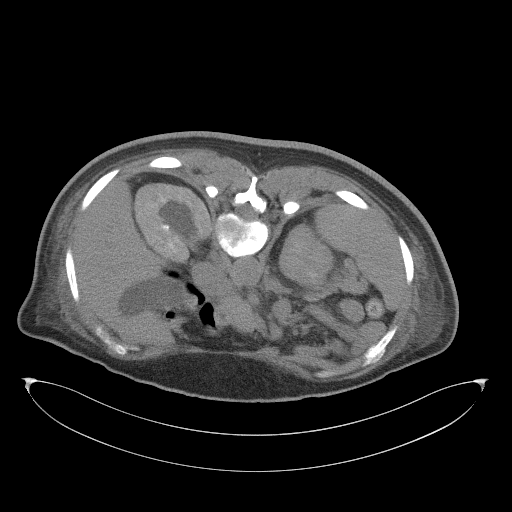

[14 of 32 positions shown; findings below may reference images not displayed]

EXAM:
CT-guided biopsy of enlarged left periaortic lymph node

MEDICATIONS:
None.

ANESTHESIA/SEDATION:
Moderate (conscious) sedation was employed during this procedure. A
total of Versed 2 mg and Fentanyl 100 mcg was administered
intravenously.

Moderate Sedation Time: 18 minutes. The patient's level of
consciousness and vital signs were monitored continuously by
radiology nursing throughout the procedure under my direct
supervision.

COMPLICATIONS:
None immediate.

PROCEDURE:
Informed written consent was obtained from the patient after a
thorough discussion of the procedural risks, benefits and
alternatives. All questions were addressed. Maximal Sterile Barrier
Technique was utilized including caps, mask, sterile gowns, sterile
gloves, sterile drape, hand hygiene and skin antiseptic. A timeout
was performed prior to the initiation of the procedure.

Patient position prone on the CT table.

Left flank skin prepped and draped in usual sterile fashion.

Following local lidocaine administration, 17 gauge introducer needle
was advanced into the enlarged left retroperitoneal lymph node
utilizing CT guidance.

Four cores were obtained from the enlarged left retroperitoneal
lymph node and sent to pathology in sterile saline.

Needle removed and hemostasis achieved with manual compression.
IMPRESSION: CT-guided biopsy enlarged left retroperitoneal lymph node.

## 2021-10-01 MED ORDER — LIDOCAINE-EPINEPHRINE 1 %-1:100000 IJ SOLN
INTRAMUSCULAR | Status: AC
  Filled 2021-10-01: qty 1

## 2021-10-01 MED ORDER — FENTANYL CITRATE (PF) 100 MCG/2ML IJ SOLN
INTRAMUSCULAR | Status: AC | PRN
Start: 2021-10-01 — End: 2021-10-01
  Administered 2021-10-01 (×2): 25 ug via INTRAVENOUS

## 2021-10-01 MED ORDER — NALOXONE HCL 0.4 MG/ML IJ SOLN
INTRAMUSCULAR | Status: AC
  Filled 2021-10-01: qty 1

## 2021-10-01 MED ORDER — GADOBUTROL 1 MMOL/ML IV SOLN
10.0000 mL | Freq: Once | INTRAVENOUS | Status: AC | PRN
  Administered 2021-10-01: 10 mL via INTRAVENOUS

## 2021-10-01 MED ORDER — DEGARELIX ACETATE(240 MG DOSE) 120 MG/VIAL ~~LOC~~ SOLR
240.0000 mg | Freq: Once | SUBCUTANEOUS | Status: AC
  Administered 2021-10-01: 240 mg via SUBCUTANEOUS
  Filled 2021-10-01: qty 6

## 2021-10-01 MED ORDER — MIDAZOLAM HCL 2 MG/2ML IJ SOLN
INTRAMUSCULAR | Status: AC | PRN
Start: 2021-10-01 — End: 2021-10-01
  Administered 2021-10-01 (×2): .5 mg via INTRAVENOUS

## 2021-10-01 MED ORDER — MIDAZOLAM HCL 2 MG/2ML IJ SOLN
INTRAMUSCULAR | Status: AC
  Filled 2021-10-01: qty 4

## 2021-10-01 MED ORDER — IOHEXOL 350 MG/ML SOLN
60.0000 mL | Freq: Once | INTRAVENOUS | Status: AC | PRN
  Administered 2021-10-01: 60 mL via INTRAVENOUS

## 2021-10-01 MED ORDER — LIDOCAINE HCL (PF) 1 % IJ SOLN
INTRAMUSCULAR | Status: AC | PRN
  Administered 2021-10-01: 10 mL via INTRADERMAL

## 2021-10-01 MED ORDER — FENTANYL CITRATE (PF) 100 MCG/2ML IJ SOLN
INTRAMUSCULAR | Status: AC
  Filled 2021-10-01: qty 4

## 2021-10-01 MED ORDER — SODIUM CHLORIDE 0.9% FLUSH: 5.0000 mL | Freq: Three times a day (TID) | INTRAVENOUS | Status: DC

## 2021-10-01 MED ORDER — SODIUM CHLORIDE 0.9 % IV SOLN
INTRAVENOUS | Status: AC
  Filled 2021-10-01: qty 250

## 2021-10-01 MED ORDER — MIDAZOLAM HCL 2 MG/2ML IJ SOLN
INTRAMUSCULAR | Status: AC | PRN
Start: 2021-10-01 — End: 2021-10-01
  Administered 2021-10-01: 1 mg via INTRAVENOUS

## 2021-10-01 MED ORDER — SODIUM CHLORIDE (PF) 0.9 % IJ SOLN
INTRAMUSCULAR | Status: AC
  Filled 2021-10-01: qty 50

## 2021-10-01 MED ORDER — MIDAZOLAM HCL 2 MG/2ML IJ SOLN
INTRAMUSCULAR | Status: AC | PRN
  Administered 2021-10-01: 1 mg via INTRAVENOUS

## 2021-10-01 MED ORDER — FENTANYL CITRATE (PF) 100 MCG/2ML IJ SOLN
INTRAMUSCULAR | Status: AC | PRN
  Administered 2021-10-01: 50 ug via INTRAVENOUS

## 2021-10-01 MED ORDER — FLUMAZENIL 0.5 MG/5ML IV SOLN
INTRAVENOUS | Status: AC
  Filled 2021-10-01: qty 5

## 2021-10-01 MED ORDER — MIDAZOLAM HCL 2 MG/2ML IJ SOLN: INTRAMUSCULAR | Status: AC | PRN

## 2021-10-01 MED ORDER — FENTANYL CITRATE (PF) 100 MCG/2ML IJ SOLN
INTRAMUSCULAR | Status: AC | PRN
Start: 2021-10-01 — End: 2021-10-01
  Administered 2021-10-01: 50 ug via INTRAVENOUS

## 2021-10-01 MED ORDER — IOHEXOL 300 MG/ML  SOLN
50.0000 mL | Freq: Once | INTRAMUSCULAR | Status: AC | PRN
  Administered 2021-10-01: 5 mL

## 2021-10-01 NOTE — Procedures (Signed)
Interventional Radiology Procedure Note  Procedure:  CT guided biopsy of retroperitoneal lymph node 2.   Right percutaneous nephrostomy drain  Indication:  Abdominal and pelvic lymph adenopathy Right hydronephrosis  Findings: Please refer to procedural dictation for full description.  Complications: None  EBL: < 10 mL  Miachel Roux, MD [phone]

## 2021-10-01 NOTE — Consult Note (Addendum)
Radiation Oncology         [phone] ________________________________  Name: Erik Page        MRN: [id number]  Date of Service: 10/01/21    DOB: Jan 30, 1951  [id number], [name], DO ; Dr. Verlon Au  REFERRING PHYSICIAN: Dr. Verlon Au   DIAGNOSIS: The primary encounter diagnosis was Symptomatic anemia. Diagnoses of Prostate cancer ([place]), Hydronephrosis, unspecified hydronephrosis type, and Lymphadenopathy were also pertinent to this visit.   HISTORY OF PRESENT ILLNESS: Erik Page is a 71 y.o. male seen at the request of Dr. Alinda Dooms for what appears to be metastatic prostate cancer. He presented on 09/28/2021 to the emergency department due to weakness rectal bleeding nausea and lumbar pain.  His hemoglobin was 7 and he is received transfusion.  Upon evaluation chest x-ray showed an increased interstitial marking, CT abdomen pelvis with contrast showed an enlarged prostate with soft tissue infiltration of the surrounding periprosthetic fat bladder wall involvement and tumor extension into the surrounding tissues with diffuse nodal metastasis and widespread osseous disease.  The right kidney was edematous with signs of severe hydronephrosis and hydroureter the distal right ureter is likely obstructed by either pelvic adenopathy or tumor tumor invasion of the right UVJ.  A CT of the lumbar spine on 09/28/2021 showed diffuse sclerotic lesions throughout the spine and pelvis especially at L3-L4 moderate spinal canal stenosis and severe right neural foraminal narrowing as well as L4-5 moderate to severe right neural foraminal narrowing and L5-S1 moderate left and mild right neural foraminal narrowing was noted.  He is contacted to consider palliative radiotherapy.  He is scheduled to undergo nephrostomy placement and biopsy of the pelvic lymph node today.    PREVIOUS RADIATION THERAPY: No   PAST MEDICAL HISTORY:  Past Medical History:  Diagnosis Date   HTN (hypertension)    Myocardial infarction [facility])         PAST SURGICAL HISTORY: Past Surgical History:  Procedure Laterality Date   CARDIAC CATHETERIZATION N/A 08/15/2015   Procedure: Left Heart Cath and Coronary Angiography;  Surgeon: Leonie Man, MD;  Location: [facility];  Service: Cardiovascular;  Laterality: N/A;   CARDIAC CATHETERIZATION N/A 08/15/2015   Procedure: Left Heart Cath and Coronary Angiography;  Surgeon: Leonie Man, MD;  Location: [facility];  Service: Cardiovascular;  Laterality: N/A;   CARDIAC CATHETERIZATION N/A 08/15/2015   Procedure: Coronary Stent Intervention;  Surgeon: Leonie Man, MD;  Location: [facility];  Service: Cardiovascular;  Laterality: N/A;   TONSILLECTOMY       FAMILY HISTORY:  Family History  Problem Relation Age of Onset   Hypertension Other      SOCIAL HISTORY:  reports that he has never smoked. He has never used smokeless tobacco. He reports that he does not drink alcohol and does not use drugs. The patient is married and lives in [place], [place]. 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    ALLERGIES: Patient has no known allergies.   MEDICATIONS:  Current Facility-Administered Medications  Medication Dose Route Frequency Provider Last Rate Last Admin   acetaminophen (TYLENOL) tablet 650 mg  650 mg Oral Q6H PRN Lenore Cordia, MD       Or   acetaminophen (TYLENOL) suppository 650 mg  650 mg Rectal Q6H PRN Lenore Cordia, MD       atorvastatin (LIPITOR) tablet 80 mg  80 mg Oral Daily Lenore Cordia, MD   80 mg at 09/30/21 1039  cefTRIAXone (ROCEPHIN) 2 g in sodium chloride 0.9 % 100 mL IVPB  2 g Intravenous to XRAY Han, Aimee H, PA-C       feeding supplement (ENSURE ENLIVE / ENSURE PLUS) liquid 237 mL  237 mL Oral BID BM Samtani, Jai-Gurmukh, MD   237 mL at 09/30/21 1330   ibuprofen (ADVIL) tablet 600 mg  600 mg Oral TID Nita Sells, MD   600 mg at 09/30/21 2151   lactated ringers infusion    Intravenous Continuous Nita Sells, MD 100 mL/hr at 10/01/21 0347 Restarted at 10/01/21 0347   metoprolol tartrate (LOPRESSOR) tablet 25 mg  25 mg Oral BID Lovey Newcomer T, NP   25 mg at 09/28/21 2230   morphine 2 MG/ML injection 1 mg  1 mg Intravenous Q2H PRN Zada Finders R, MD   1 mg at 09/30/21 0319   multivitamin with minerals tablet 1 tablet  1 tablet Oral Daily Nita Sells, MD   1 tablet at 09/30/21 1755   ondansetron (ZOFRAN) tablet 4 mg  4 mg Oral Q6H PRN Lenore Cordia, MD       Or   ondansetron (ZOFRAN) injection 4 mg  4 mg Intravenous Q6H PRN Lenore Cordia, MD       oxyCODONE-acetaminophen (PERCOCET/ROXICET) 5-325 MG per tablet 1-2 tablet  1-2 tablet Oral Q4H PRN Lovey Newcomer T, NP   1 tablet at 10/01/21 4268   And   oxyCODONE (Oxy IR/ROXICODONE) immediate release tablet 5 mg  5 mg Oral Q4H PRN Lovey Newcomer T, NP   5 mg at 10/01/21 3419   senna-docusate (Senokot-S) tablet 1 tablet  1 tablet Oral QHS PRN Lenore Cordia, MD         REVIEW OF SYSTEMS: On review of systems, the patient reports that he is having pain in his low spine that localizes to the low back and radiates anteriorly to the medial and anterior thigh.  He denies any numbness or tingling or sensory changes.  He denies lower extremity weakness.  He has felt weak overall however but attributes this to his anemia.  He states he is also been having some pain in his upper trunk bilaterally that he feels may be localized to the ribs or upper aspect of the thoracic spine.  The levels are above the bellybutton.  Again he denies any neuropathic symptoms at this level.  He is satisfied with his current pain regimen and very much open to neck steps of confirming the suspected prostate cancer diagnosis.  No other complaints are verbalized.     PHYSICAL EXAM:  Wt Readings from Last 3 Encounters:  09/28/21 211 lb 10.3 oz (96 kg)  09/19/21 219 lb 6.4 oz (99.5 kg)  07/22/19 243 lb (110.2 kg)   Temp Readings  from Last 3 Encounters:  10/01/21 97.8 F (36.6 C) (Oral)  07/22/19 (!) 96.8 F (36 C) (Temporal)  08/16/15 97.7 F (36.5 C) (Oral)   BP Readings from Last 3 Encounters:  10/01/21 113/65  09/19/21 134/76  07/22/19 109/65   Pulse Readings from Last 3 Encounters:  10/01/21 81  09/19/21 97  07/22/19 74   Pain Assessment Pain Score: 1 /10  Unable to assess due to encounter type.  ECOG = 2  0 - Asymptomatic (Fully active, able to carry on all predisease activities without restriction)  1 - Symptomatic but completely ambulatory (Restricted in physically strenuous activity but ambulatory and able to carry out work of a light or sedentary nature. For example,  light housework, office work)  2 - Symptomatic, <50% in bed during the day (Ambulatory and capable of all self care but unable to carry out any work activities. Up and about more than 50% of waking hours)  3 - Symptomatic, >50% in bed, but not bedbound (Capable of only limited self-care, confined to bed or chair 50% or more of waking hours)  4 - Bedbound (Completely disabled. Cannot carry on any self-care. Totally confined to bed or chair)  5 - Death   Eustace Pen MM, Creech RH, Tormey DC, et al. [phone]). "Toxicity and response criteria of the [facility]". [place] Oncol. 5 (6): 649-55    LABORATORY DATA:  Lab Results  Component Value Date   WBC 5.3 09/30/2021   HGB 7.5 (L) 09/30/2021   HCT 23.1 (L) 09/30/2021   MCV 90.6 09/30/2021   PLT 136 (L) 09/30/2021   Lab Results  Component Value Date   NA 139 10/01/2021   K 3.7 10/01/2021   CL 103 10/01/2021   CO2 25 10/01/2021   Lab Results  Component Value Date   ALT 16 10/01/2021   AST 22 10/01/2021   ALKPHOS 259 (H) 10/01/2021   BILITOT 1.4 (H) 10/01/2021      RADIOGRAPHY: DG Chest 2 View  Result Date: 09/28/2021 CLINICAL DATA:  shortness of breath EXAM: CHEST - 2 VIEW COMPARISON:  None. FINDINGS: The heart and mediastinal contours are  within normal limits. Elevated left hemidiaphragm. No focal consolidation. Increased interstitial markings. No pleural effusion. No pneumothorax. No acute osseous abnormality. IMPRESSION: Increased interstitial markings. Query underlying infection/inflammation. Electronically Signed   By: Iven Finn M.D.   On: 09/28/2021 15:54   CT Abdomen Pelvis W Contrast  Result Date: 09/28/2021 CLINICAL DATA:  Evaluate for bowel perforation. Abdominal pain, nonlocalized. Complains of weakness and rectal bleeding. EXAM: CT ABDOMEN AND PELVIS WITH CONTRAST TECHNIQUE: Multidetector CT imaging of the abdomen and pelvis was performed using the standard protocol following bolus administration of intravenous contrast. CONTRAST:  74mL OMNIPAQUE IOHEXOL 350 MG/ML SOLN COMPARISON:  None. FINDINGS: Lower chest: No acute abnormality. Hepatobiliary: No focal liver abnormality is seen. Tiny stones are noted layering within the dependent portion of the gallbladder measuring up to 3 mm. No signs of gallbladder wall inflammation or bile duct dilatation. Pancreas: Unremarkable. No pancreatic ductal dilatation or surrounding inflammatory changes. Spleen: There is a small linear hypodensity within the posterior spleen measuring 1.3 cm. This is of uncertain clinical significance. Signs of adjacent rib fractures are identified which appears subacute to chronic. Adrenals/Urinary Tract: The adrenal glands are normal. Small left kidney cysts are identified. Left kidney is otherwise unremarkable. The right kidney appears diffusely edematous and there is severe right hydronephrosis and hydroureter. Markedly enlarged prostate gland is identified with mass effect upon the base of bladder with suspected tumor infiltration along the posterior wall of bladder, image 91/6. Stomach/Bowel: Stomach appears normal. The appendix is visualized and appears normal. No bowel wall thickening, inflammation, or distension. Vascular/Lymphatic: Aortic atherosclerosis  without aneurysm. Extensive bilateral retroperitoneal and bilateral pelvic adenopathy identified compatible with metastatic disease. Index retrocaval lymph node measures 1.7 cm, image 35/2. Index left retroperitoneal lymph node measures 2.2 cm, image 38/2. Left common iliac lymph node is enlarged measuring 2 cm, image 55/2. Right pelvic sidewall lymph node measures 2.2 cm, image 71/2. Left pelvic sidewall lymph node measures 1.8 cm, image 72/2. Reproductive: The prostate gland is enlarged. The margins of the superior portions of the prostate gland are irregular  with nodular soft tissue infiltration into the surrounding periprosthetic fat. Findings are worrisome for prostate cancer. Other: No free fluid or fluid collections identified within the abdomen or pelvis. Musculoskeletal: Widespread sclerotic metastases are identified throughout the visualized axial and appendicular skeleton. Degenerative disc disease noted within the lumbar spine. IMPRESSION: 1. Enlarged prostate gland with nodular soft tissue infiltration into the surrounding periprosthetic fat. Signs of bladder wall involvement are identified. Findings are worrisome for prostate cancer with tumor extension into the surrounding soft tissues, diffuse nodal metastasis, and widespread osseous metastatic disease. 2. The right kidney is markedly edematous with signs of severe hydronephrosis and hydroureter. The distal right ureter is likely obstructed by either pelvic adenopathy or tumor invasion at the right UVJ. 3. Gallstones. 4. There is a linear hypodensity within the inferior spleen. Although no secondary signs of acute splenic injury there is adjacent subacute to chronic left rib fractures. Findings may reflect sequelae of old splenic injury. Correlate for any clinical history of trauma to the left upper quadrant abdominal wall. 5. Aortic Atherosclerosis (ICD10-I70.0). Electronically Signed   By: Kerby Moors M.D.   On: 09/28/2021 17:54   CT L-SPINE  NO CHARGE  Result Date: 09/28/2021 CLINICAL DATA:  Weakness, rectal bleeding, nausea, lumbar pain EXAM: CT LUMBAR SPINE WITHOUT CONTRAST TECHNIQUE: Multidetector CT imaging of the lumbar spine was performed without intravenous contrast administration. Multiplanar CT image reconstructions were also generated. COMPARISON:  None. FINDINGS: Segmentation: 5 lumbar type vertebrae. Alignment: Levocurvature of the lumbar spine. Straightening of the normal lumbar lordosis. Trace retrolisthesis L5 on S1. Vertebrae: Diffuse sclerotic lesions throughout the imaged spine and pelvis, concerning for diffuse osseous metastatic disease. No acute fracture. Vertebral body heights are preserved. Paraspinal and other soft tissues: Please see same-day CT abdomen pelvis. Disc levels: T12-L1: No significant disc bulge. No spinal canal stenosis or neural foraminal narrowing. L1-L2: No significant disc bulge. No spinal canal stenosis or neural foraminal narrowing. L2-L3: Mild disc bulge. Moderate facet arthropathy. No spinal canal stenosis or neural foraminal narrowing. L3-L4: Large disc bulge. Severe right and mild left facet arthropathy. Moderate spinal canal stenosis. Severe right neural foraminal narrowing. L4-L5: Moderate disc bulge. Moderate to severe right and mild left facet arthropathy. No spinal canal stenosis. Moderate to severe right neural foraminal narrowing. L5-S1: Trace retrolisthesis and mild disc bulge. Moderate facet arthropathy. No spinal canal stenosis. Mild right and moderate left neural foraminal narrowing. IMPRESSION: 1. Diffuse sclerotic lesions throughout the imaged spine and pelvis, concerning for diffuse osseous metastatic disease. 2. No acute vertebral body fracture. 3. L3-L4 moderate spinal canal stenosis and severe right neural foraminal narrowing. 4. L4-L5 moderate to severe right neural foraminal narrowing. 5. L5-S1 moderate left and mild right neural foraminal narrowing. Electronically Signed   By: Merilyn Baba M.D.   On: 09/28/2021 17:52       IMPRESSION/PLAN: 1. Elevated PSA with CT imaging concerning for widely metastatic prostate cancer. Dr. Lisbeth Renshaw has reviewed his imaging to date. His case was discussed in brain and spine oncology conference this morning. He needs CT biopsy for pathology confirmation of his suspected disease.  Given his symptoms I think it would be also helpful to have further imaging of his thoracic spine as well as lumbar spine with MRI.  He is in agreement to proceed.  I suspect that he will also need imaging of the chest but we will defer to medical oncology.  We reviewed the rationale for palliative radiotherapy to the lumbar spine. We discussed the risks, benefits,  short, and long term effects of radiotherapy, as well as the palliative intent, and the patient is interested in proceeding once disease is confirmed. I discussed the delivery and logistics of radiotherapy and Dr. Lisbeth Renshaw anticipates a course of 2 weeks of radiotherapy. We plan simulation once we know the date of his biopsy.  2. Pain secondary to #1. We appreciate the care of the patient by the inpt team. The patient is aware that his relief from radiotherapy wouldn't be immediate, but toward the conclusion of radiation, so ongoing medication management will need to continue.    In a visit lasting 70 minutes, greater than 50% of the time was spent by phone and in floor discussing the patient's condition, in preparation for the discussion, and coordinating the patient's care.     Carola Rhine, [facility]   **Disclaimer: This note was dictated with voice recognition software. Similar sounding words can inadvertently be transcribed and this note may contain transcription errors which may not have been corrected upon publication of note.**

## 2021-10-01 NOTE — Progress Notes (Signed)
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  Obstruction 2/2 pelvic adenopathy versus tumor invasion CT L-spine = sclerotic lesion-L3-4 moderate stenosis, L4-5 moderate right foraminal narrowing Patient hydrated with 2 L of saline Dr. Louis Meckel of urology were consulted, Dr. Michail Sermon GI was consulted as well because of positive guaiac  Hospital-Problem based course  Anemia likely secondary to malignancy GI feels this is likely hemorrhoidal and have signed off-no reports of further bleeding Received 1 unit PRBC -hemoglobin has improved Low back pain progressive weakness weight loss PSA above 1400--likely prostatic cancer Radiation and medical oncology have seen patient with plans for outpatient further consolidation of care--started on Degarelix Going for diagnostic CT-guided biopsy and percutaneous nephrostomy placement 1/9 Pain control with increased Oxy IR every 4 to Percocet 2 tablets every 4 as needed moderate pain First-line-- added back ibuprofen 600 3 times daily May take morphine 1 mg every 2 as needed for severe pain  Fevers Likely secondary to underlying prostate issues-no current fever over 100.5 UA from admission was negative for leukocytes Metastatic prostate cancer hydroureter extension into bladder wall and hydronephrosis Was on IV fluid-saline lock 1/9-repeat labs a.m. 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 Stop losartan,-labs in the morning   DVT prophylaxis: SCD Code Status: Full Family Communication: Wife present at bedside this morning Disposition:  Status is: Inpatient  Remains inpatient appropriate because: Needs procedure and once done can likely discharge home 1/10  Consultants:  Gastroenterology Urology  Procedures:   Antimicrobials:     Subjective:  Pain much better controlled ambulated 200 feet no chest pain no fever wife present at the bedside this morning Eating and drinking  Objective: Vitals:   10/01/21 0853 10/01/21 1245 10/01/21 1300 10/01/21 1305  BP: 121/76 116/71 113/71 106/67  Pulse: 86 80 80 81  Resp:  18 13 13   Temp:      TempSrc:      SpO2:  99% 100% 100%  Weight:      Height:        Intake/Output Summary (Last 24 hours) at 10/01/2021 1321 Last data filed at 10/01/2021 1200 Gross per 24 hour  Intake 740 ml  Output 1200 ml  Net -460 ml    Filed Weights   09/28/21 1430 09/28/21 2223  Weight: 96.2 kg 96 kg    Examination:  EOMI NCAT no focal deficit Chest clear no added sound  Abdomen soft no rebound no guarding No lower extremity edema ROM intact but limited by pain to straight leg raise bilaterally Neuro 5/5 strength Psych euthymic  Data Reviewed: personally reviewed   CBC    Component Value Date/Time   WBC 4.5 10/01/2021 0539   RBC 2.78 (L) 10/01/2021 0539   HGB 8.2 (L) 10/01/2021 0539   HCT 25.4 (L) 10/01/2021 0539   PLT 156 10/01/2021 0539   MCV 91.4 10/01/2021 0539   MCH 29.5 10/01/2021  0539   MCHC 32.3 10/01/2021 0539   RDW 15.7 (H) 10/01/2021 0539   LYMPHSABS 1.2 10/01/2021 0539   MONOABS 0.4 10/01/2021 0539   EOSABS 0.1 10/01/2021 0539   BASOSABS 0.0 10/01/2021 0539   CMP Latest Ref Rng & Units 10/01/2021 09/30/2021 09/29/2021  Glucose 70 - 99 mg/dL 125(H) 106(H) 133(H)  BUN 8 - 23 mg/dL 18 19 16   Creatinine 0.61 -  1.24 mg/dL 1.14 1.22 1.15  Sodium 135 - 145 mmol/L 139 136 135  Potassium 3.5 - 5.1 mmol/L 3.7 3.9 4.2  Chloride 98 - 111 mmol/L 103 102 100  CO2 22 - 32 mmol/L 25 24 24   Calcium 8.9 - 10.3 mg/dL 8.0(L) 7.7(L) 8.2(L)  Total Protein 6.5 - 8.1 g/dL 6.2(L) 6.1(L) 6.2(L)  Total Bilirubin 0.3 - 1.2 mg/dL 1.4(H) 1.7(H) 1.7(H)  Alkaline Phos 38 - 126 U/L 259(H) 244(H) 283(H)  AST 15 - 41 U/L 22 22 22   ALT 0 - 44 U/L 16 15 17      Radiology Studies: No results found.   Scheduled Meds:  atorvastatin  80 mg Oral Daily   degarelix  240 mg Subcutaneous Once   feeding supplement  237 mL Oral BID BM   flumazenil       ibuprofen  600 mg Oral TID   metoprolol tartrate  25 mg Oral BID   multivitamin with minerals  1 tablet Oral Daily   naloxone       sodium chloride (PF)       Continuous Infusions:  cefTRIAXone (ROCEPHIN)  IV     lactated ringers 100 mL/hr at 10/01/21 0347   sodium chloride       LOS: 3 days   Time spent: [place], MD [facility] To contact the attending provider between 7A-7P or the covering provider during after hours 7P-7A, please log into the web site [email or web address] and access using universal [place] password for that web site. If you do not have the password, please call the hospital operator.  10/01/2021, 1:21 PM

## 2021-10-01 NOTE — Evaluation (Signed)
Physical Therapy Evaluation Patient Details Name: Erik Page MRN: [id number] DOB: [birth year] Today's Date: 10/01/2021  History of Present Illness  71 y.o. male seen at the request of Dr. Alinda Dooms for what appears to be metastatic prostate cancer. He presented on 09/28/2021 to the emergency department due to weakness rectal bleeding nausea and lumbar pain.  His hemoglobin was 7 and he is received transfusion.  Upon evaluation chest x-ray showed an increased interstitial marking, CT abdomen pelvis with contrast showed an enlarged prostate with soft tissue infiltration of the surrounding periprosthetic fat bladder wall involvement and tumor extension into the surrounding tissues with diffuse nodal metastasis and widespread osseous disease.  The right kidney was edematous with signs of severe hydronephrosis and hydroureter the distal right ureter is likely obstructed by either pelvic adenopathy or tumor tumor invasion of the right UVJ.  A CT of the lumbar spine on 09/28/2021 showed diffuse sclerotic lesions throughout the spine and pelvis especially at L3-L4 moderate spinal canal stenosis and severe right neural foraminal narrowing as well as L4-5 moderate to severe right neural foraminal narrowing and L5-S1 moderate left and mild right neural foraminal narrowing was noted.  He is contacted to consider palliative radiotherapy.  He is scheduled to undergo nephrostomy placement and biopsy of the pelvic lymph node 10/01/21.  Clinical Impression  Pt is mobilizing well without an assistive device. He ambulated 200' without a walker, no loss of balance. He is safe to ambulate in halls independently and was encouraged to do so to minimize deconditioning during hospitalization. PT signing off.        Recommendations for follow up therapy are one component of a multi-disciplinary discharge planning process, led by the attending physician.  Recommendations may be updated based on patient status, additional functional criteria  and insurance authorization.  Follow Up Recommendations No PT follow up    Assistance Recommended at Discharge Set up Supervision/Assistance  Patient can return home with the following  A little help with bathing/dressing/bathroom    Equipment Recommendations None recommended by PT  Recommendations for Other Services       Functional Status Assessment Patient has had a recent decline in their functional status and demonstrates the ability to make significant improvements in function in a reasonable and predictable amount of time.     Precautions / Restrictions Precautions Precautions: Fall Restrictions Weight Bearing Restrictions: No      Mobility  Bed Mobility Overal bed mobility: Modified Independent             General bed mobility comments: HOB up, used rail. Instructed pt in log roll technique to minimize strain to low back.    Transfers Overall transfer level: Independent Equipment used: None                    Ambulation/Gait Ambulation/Gait assistance: Independent Gait Distance (Feet): 200 Feet Assistive device: None Gait Pattern/deviations: Decreased stride length Gait velocity: decr     General Gait Details: steady, no loss of balance, occaissional reaching handrail in hall for support, refused AD  Stairs            Wheelchair Mobility    Modified Rankin (Stroke Patients Only)       Balance Overall balance assessment: Independent                                           Pertinent  Vitals/Pain Pain Assessment: No/denies pain    Home Living Family/patient expects to be discharged to:: Private residence Living Arrangements: Spouse/significant other Available Help at Discharge: Family;Available 24 hours/day Type of Home: House Home Access: Level entry     Alternate Level Stairs-Number of Steps: flight Home Layout: Multi-level;Able to live on main level with bedroom/bathroom Home Equipment: None       Prior Function Prior Level of Function : Independent/Modified Independent;Driving             Mobility Comments: pt is a Theme park manager and retired Nurse, learning disability, independent at [place]        Extremity/Trunk Assessment   Upper Extremity Assessment Upper Extremity Assessment: Defer to OT evaluation    Lower Extremity Assessment Lower Extremity Assessment: Overall WFL for tasks assessed RLE Deficits / Details: knee ext 5/5, ankle PF/DF 5/5 RLE Sensation: WNL LLE Deficits / Details: knee ext 5/5, ankle PF/DF 5/5 LLE Sensation: WNL LLE Coordination: WNL    Cervical / Trunk Assessment Cervical / Trunk Assessment: Normal  Communication   Communication: No difficulties  Cognition Arousal/Alertness: Awake/alert Behavior During Therapy: WFL for tasks assessed/performed Overall Cognitive Status: Within Functional Limits for tasks assessed                                          General Comments      Exercises     Assessment/Plan    PT Assessment Patient does not need any further PT services  PT Problem List         PT Treatment Interventions      PT Goals (Current goals can be found in the Care Plan section)  Acute Rehab PT Goals PT Goal Formulation: All assessment and education complete, DC therapy    Frequency       Co-evaluation PT/OT/SLP Co-Evaluation/Treatment: Yes Reason for Co-Treatment: To address functional/ADL transfers PT goals addressed during session: Mobility/safety with mobility;Balance         AM-PAC PT "6 Clicks" Mobility  Outcome Measure Help needed turning from your back to your side while in a flat bed without using bedrails?: None Help needed moving from lying on your back to sitting on the side of a flat bed without using bedrails?: None Help needed moving to and from a bed to a chair (including a wheelchair)?: None Help needed standing up from a chair using your arms (e.g., wheelchair or  bedside chair)?: None Help needed to walk in hospital room?: None Help needed climbing 3-5 steps with a railing? : None 6 Click Score: 24    End of Session Equipment Utilized During Treatment: Gait belt Activity Tolerance: Patient tolerated treatment well Patient left: in chair;with call bell/phone within reach;with family/visitor present Nurse Communication: Mobility status      Time: 3734-2876 PT Time Calculation (min) (ACUTE ONLY): 29 min   Charges:   PT Evaluation $PT Eval Moderate Complexity: 1 Mod         Philomena Doheny PT 10/01/2021  Acute Rehabilitation Services Pager [phone] Office [phone]

## 2021-10-01 NOTE — Progress Notes (Signed)
°  Transition of Care (TOC) Screening Note   Patient Details  Name: Erik Page Date of Birth: 06-05-1951   Transition of [facility]) CM/SW Contact:    Jamarius Saha, Marjie Skiff, RN Phone Number: 10/01/2021, 1:07 PM    Transition of Care Department [facility]) has reviewed patient and no TOC needs have been identified at this time. We will continue to monitor patient advancement through interdisciplinary progression rounds. If new patient transition needs arise, please place a TOC consult.

## 2021-10-01 NOTE — Consult Note (Signed)
Reason for the request:    Prostate cancer  HPI: I was asked by Dr. Verlon Au to evaluate [name] for the evaluation of presumed prostate cancer.  He is a 71 year old hospitalized on September 28, 2021 after presenting with symptoms of fatigue and GI bleeding.  He was found to have a PSA of 1954.  Imaging studies showed enlarged prostate with local extension into the right sidewall of the bladder and hydronephrosis.  He was found to have extensive pelvic and periaortic lymphadenopathy as well as bone involvement.  He is scheduled to have a percutaneous nephrostomy tube and lymph node biopsy in the near future.  He was found to have a hemoglobin of 6.3 and received packed red cell transfusion with hemoglobin now is 8.2.  He has reported symptoms of fatigue, tiredness and lower back pain.  He is under evaluation for possible palliative radiation treatment.  Clinically, he reports improvement in his overall sy he flecking kidding me right now mptoms but still has lower back pain issues.  He denies any chest pain, shortness of breath or difficulty breathing.  He denies any palpitation or orthopnea.  He denies any hemoptysis.    He does not report any headaches, blurry vision, syncope or seizures. Does not report any fevers, chills or sweats.  Does not report any cough, wheezing or hemoptysis.  Does not report any chest pain, palpitation, orthopnea or leg edema.  Does not report any nausea, vomiting or abdominal pain.  Does not report any constipation or diarrhea.  Does not report any skeletal complaints.    Does not report frequency, urgency or hematuria.  Does not report any skin rashes or lesions. Does not report any heat or cold intolerance.  Does not report any lymphadenopathy or petechiae.  Does not report any anxiety or depression.  Remaining review of systems is negative.     Past Medical History:  Diagnosis Date   HTN (hypertension)    Myocardial infarction [facility])   :   Past Surgical History:   Procedure Laterality Date   CARDIAC CATHETERIZATION N/A 08/15/2015   Procedure: Left Heart Cath and Coronary Angiography;  Surgeon: Leonie Man, MD;  Location: [place];  Service: Cardiovascular;  Laterality: N/A;   CARDIAC CATHETERIZATION N/A 08/15/2015   Procedure: Left Heart Cath and Coronary Angiography;  Surgeon: Leonie Man, MD;  Location: [facility];  Service: Cardiovascular;  Laterality: N/A;   CARDIAC CATHETERIZATION N/A 08/15/2015   Procedure: Coronary Stent Intervention;  Surgeon: Leonie Man, MD;  Location: [place] [facility];  Service: Cardiovascular;  Laterality: N/A;   TONSILLECTOMY    :   Current Facility-Administered Medications:    acetaminophen (TYLENOL) tablet 650 mg, 650 mg, Oral, Q6H PRN **OR** acetaminophen (TYLENOL) suppository 650 mg, 650 mg, Rectal, Q6H PRN, Posey Pronto, Vishal R, MD   atorvastatin (LIPITOR) tablet 80 mg, 80 mg, Oral, Daily, Zada Finders R, MD, 80 mg at 09/30/21 1039   cefTRIAXone (ROCEPHIN) 2 g in sodium chloride 0.9 % 100 mL IVPB, 2 g, Intravenous, On Call, Han, Aimee H, PA-C   feeding supplement (ENSURE ENLIVE / ENSURE PLUS) liquid 237 mL, 237 mL, Oral, BID BM, Samtani, Jai-Gurmukh, MD, 237 mL at 09/30/21 1330   ibuprofen (ADVIL) tablet 600 mg, 600 mg, Oral, TID, Samtani, Jai-Gurmukh, MD, 600 mg at 09/30/21 2151   lactated ringers infusion, , Intravenous, Continuous, Samtani, Jai-Gurmukh, MD, Last Rate: 100 mL/hr at 10/01/21 0347, Restarted at 10/01/21 0347   metoprolol tartrate (LOPRESSOR)  tablet 25 mg, 25 mg, Oral, BID, Blount, Xenia T, NP, 25 mg at 10/01/21 0854   morphine 2 MG/ML injection 1 mg, 1 mg, Intravenous, Q2H PRN, Lenore Cordia, MD, 1 mg at 09/30/21 0319   multivitamin with minerals tablet 1 tablet, 1 tablet, Oral, Daily, Nita Sells, MD, 1 tablet at 09/30/21 1755   ondansetron (ZOFRAN) tablet 4 mg, 4 mg, Oral, Q6H PRN **OR** ondansetron (ZOFRAN) injection 4 mg, 4 mg, Intravenous, Q6H PRN, Lenore Cordia, MD   oxyCODONE-acetaminophen (PERCOCET/ROXICET) 5-325 MG per tablet 1-2 tablet, 1-2 tablet, Oral, Q4H PRN, 1 tablet at 10/01/21 0626 **AND** oxyCODONE (Oxy IR/ROXICODONE) immediate release tablet 5 mg, 5 mg, Oral, Q4H PRN, Blount, Xenia T, NP, 5 mg at 10/01/21 0814   senna-docusate (Senokot-S) tablet 1 tablet, 1 tablet, Oral, QHS PRN, Lenore Cordia, MD:  No Known Allergies:   Family History  Problem Relation Age of Onset   Hypertension Other   :   Social History   Socioeconomic History   Marital status: Married    Spouse name: Not on file   Number of children: Not on file   Years of education: Not on file   Highest education level: Not on file  Occupational History   Not on file  Tobacco Use   Smoking status: Never   Smokeless tobacco: Never  Vaping Use   Vaping Use: Never used  Substance and Sexual Activity   Alcohol use: No    Alcohol/week: 0.0 standard drinks   Drug use: No   Sexual activity: Not on file  Other Topics Concern   Not on file  Social History Narrative   Works as a Engineer, materials; 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  Exam: Blood pressure 121/76, pulse 86, temperature 97.8 F (36.6 C), temperature source Oral, resp. rate 18, height 6\' 2"  (1.88 m), weight 211 lb 10.3 oz (96 kg), SpO2 95 %. General appearance: alert and cooperative appeared without distress. Head: atraumatic without any abnormalities. Eyes: conjunctivae/corneas clear. PERRL.  Sclera anicteric. Throat: lips, mucosa, and tongue normal; without oral thrush or ulcers. Resp: clear to auscultation bilaterally without rhonchi, wheezes or dullness to percussion. Cardio: regular rate and  rhythm, S1, S2 normal, no murmur, click, rub or gallop GI: soft, non-tender; bowel sounds normal; no masses,  no organomegaly Skin: Skin color, texture, turgor normal. No rashes or lesions Lymph nodes: Cervical, supraclavicular, and axillary nodes normal. Neurologic: Grossly normal without any motor, sensory or deep tendon reflexes. Musculoskeletal: No joint deformity or effusion.   Recent Labs    09/30/21 0553 10/01/21 0539  WBC 5.3 4.5  HGB 7.5* 8.2*  HCT 23.1* 25.4*  PLT 136* 156    Recent Labs    09/30/21 0553 10/01/21 0539  NA 136 139  K 3.9 3.7  CL 102 103  CO2 24 25  GLUCOSE 106* 125*  BUN 19 18  CREATININE 1.22 1.14  CALCIUM 7.7* 8.0*      DG Chest 2 View  Result Date: 09/28/2021 CLINICAL DATA:  shortness of breath EXAM: CHEST - 2 VIEW COMPARISON:  None. FINDINGS: The heart and mediastinal contours are within normal limits.  Elevated left hemidiaphragm. No focal consolidation. Increased interstitial markings. No pleural effusion. No pneumothorax. No acute osseous abnormality. IMPRESSION: Increased interstitial markings. Query underlying infection/inflammation. Electronically Signed   By: Iven Finn M.D.   On: 09/28/2021 15:54   CT Abdomen Pelvis W Contrast  Result Date: 09/28/2021 CLINICAL DATA:  Evaluate for bowel perforation. Abdominal pain, nonlocalized. Complains of weakness and rectal bleeding. EXAM: CT ABDOMEN AND PELVIS WITH CONTRAST TECHNIQUE: Multidetector CT imaging of the abdomen and pelvis was performed using the standard protocol following bolus administration of intravenous contrast. CONTRAST:  100mL OMNIPAQUE IOHEXOL 350 MG/ML SOLN COMPARISON:  None. FINDINGS: Lower chest: No acute abnormality. Hepatobiliary: No focal liver abnormality is seen. Tiny stones are noted layering within the dependent portion of the gallbladder measuring up to 3 mm. No signs of gallbladder wall inflammation or bile duct dilatation. Pancreas: Unremarkable. No pancreatic ductal  dilatation or surrounding inflammatory changes. Spleen: There is a small linear hypodensity within the posterior spleen measuring 1.3 cm. This is of uncertain clinical significance. Signs of adjacent rib fractures are identified which appears subacute to chronic. Adrenals/Urinary Tract: The adrenal glands are normal. Small left kidney cysts are identified. Left kidney is otherwise unremarkable. The right kidney appears diffusely edematous and there is severe right hydronephrosis and hydroureter. Markedly enlarged prostate gland is identified with mass effect upon the base of bladder with suspected tumor infiltration along the posterior wall of bladder, image 91/6. Stomach/Bowel: Stomach appears normal. The appendix is visualized and appears normal. No bowel wall thickening, inflammation, or distension. Vascular/Lymphatic: Aortic atherosclerosis without aneurysm. Extensive bilateral retroperitoneal and bilateral pelvic adenopathy identified compatible with metastatic disease. Index retrocaval lymph node measures 1.7 cm, image 35/2. Index left retroperitoneal lymph node measures 2.2 cm, image 38/2. Left common iliac lymph node is enlarged measuring 2 cm, image 55/2. Right pelvic sidewall lymph node measures 2.2 cm, image 71/2. Left pelvic sidewall lymph node measures 1.8 cm, image 72/2. Reproductive: The prostate gland is enlarged. The margins of the superior portions of the prostate gland are irregular with nodular soft tissue infiltration into the surrounding periprosthetic fat. Findings are worrisome for prostate cancer. Other: No free fluid or fluid collections identified within the abdomen or pelvis. Musculoskeletal: Widespread sclerotic metastases are identified throughout the visualized axial and appendicular skeleton. Degenerative disc disease noted within the lumbar spine. IMPRESSION: 1. Enlarged prostate gland with nodular soft tissue infiltration into the surrounding periprosthetic fat. Signs of bladder  wall involvement are identified. Findings are worrisome for prostate cancer with tumor extension into the surrounding soft tissues, diffuse nodal metastasis, and widespread osseous metastatic disease. 2. The right kidney is markedly edematous with signs of severe hydronephrosis and hydroureter. The distal right ureter is likely obstructed by either pelvic adenopathy or tumor invasion at the right UVJ. 3. Gallstones. 4. There is a linear hypodensity within the inferior spleen. Although no secondary signs of acute splenic injury there is adjacent subacute to chronic left rib fractures. Findings may reflect sequelae of old splenic injury. Correlate for any clinical history of trauma to the left upper quadrant abdominal wall. 5. Aortic Atherosclerosis (ICD10-I70.0). Electronically Signed   By: Kerby Moors M.D.   On: 09/28/2021 17:54   CT L-SPINE NO CHARGE  Result Date: 09/28/2021 CLINICAL DATA:  Weakness, rectal bleeding, nausea, lumbar pain EXAM: CT LUMBAR SPINE WITHOUT CONTRAST TECHNIQUE: Multidetector CT imaging of the lumbar spine was performed without intravenous contrast administration. Multiplanar CT image reconstructions were also generated. COMPARISON:  None. FINDINGS: Segmentation: 5 lumbar type  vertebrae. Alignment: Levocurvature of the lumbar spine. Straightening of the normal lumbar lordosis. Trace retrolisthesis L5 on S1. Vertebrae: Diffuse sclerotic lesions throughout the imaged spine and pelvis, concerning for diffuse osseous metastatic disease. No acute fracture. Vertebral body heights are preserved. Paraspinal and other soft tissues: Please see same-day CT abdomen pelvis. Disc levels: T12-L1: No significant disc bulge. No spinal canal stenosis or neural foraminal narrowing. L1-L2: No significant disc bulge. No spinal canal stenosis or neural foraminal narrowing. L2-L3: Mild disc bulge. Moderate facet arthropathy. No spinal canal stenosis or neural foraminal narrowing. L3-L4: Large disc bulge.  Severe right and mild left facet arthropathy. Moderate spinal canal stenosis. Severe right neural foraminal narrowing. L4-L5: Moderate disc bulge. Moderate to severe right and mild left facet arthropathy. No spinal canal stenosis. Moderate to severe right neural foraminal narrowing. L5-S1: Trace retrolisthesis and mild disc bulge. Moderate facet arthropathy. No spinal canal stenosis. Mild right and moderate left neural foraminal narrowing. IMPRESSION: 1. Diffuse sclerotic lesions throughout the imaged spine and pelvis, concerning for diffuse osseous metastatic disease. 2. No acute vertebral body fracture. 3. L3-L4 moderate spinal canal stenosis and severe right neural foraminal narrowing. 4. L4-L5 moderate to severe right neural foraminal narrowing. 5. L5-S1 moderate left and mild right neural foraminal narrowing. Electronically Signed   By: Merilyn Baba M.D.   On: 09/28/2021 17:52    Assessment and Plan:   71 year old with:  1.  Advanced malignancy suspicious for prostate cancer presented with enlarged prostate, lymphadenopathy and bone disease with a PSA that is at least 1460 based on laboratory testing on September 28, 2020.  The natural course of advanced, castration-sensitive prostate cancer was discussed today with the patient and his wife in detail.  Androgen deprivation therapy remains the backbone of treating advanced prostate cancer and I recommend starting it as soon as possible.  LHRH antagonist is preferred in this particular setting to prevent a flare phenomenon.  Complication associated with this therapy did include hot flashes, fatigue, sexual dysfunction among others were reiterated.  He is agreeable to proceed and we will start that in the near future.  Therapy escalation is recommended in this particular setting.  I will be in the form of systemic chemotherapy utilizing Taxotere, androgen receptor pathway inhibitors or combination of the above.  He is quite hesitant to consider  chemotherapy at this time and it would be reasonable to start with abiraterone after androgen deprivation therapy has commenced.  We will arrange outpatient follow-up upon his discharge to continue therapy consideration.  2.  Anemia: Multifactorial in nature.  Iron panel suggests more anemia of chronic disease and malignancy rather than iron deficiency.  Agree with supportive transfusion as needed.  His hemoglobin is adequate.  3.  Bone pain: I agree with palliative radiation therapy and pain medication for the time being.  Pain is more manageable currently.  4.  Hydronephrosis: Related to malignancy.  Will undergo nephrostomy tube and tissue biopsy at the same time.  5.  Prognosis and goals of care and disposition: This was discussed today with the patient and his wife in detail.  He understands he has incurable malignancy but disease that could be palliated and treated for an extended period of time.  His performance status is reasonable and aggressive measures are warranted.  We will arrange follow-up upon his discharge.  All his questions were answered to his satisfaction.   80  minutes were dedicated to this visit.  More than 50% of the time was face-to-face.  The  time was spent on reviewing laboratory data, imaging studies, discussing treatment options, discussing differential diagnosis and answering questions regarding future plan that was discussed with the patient and his wife.       A copy of this consult has been forwarded to the requesting physician.

## 2021-10-01 NOTE — Evaluation (Signed)
Occupational Therapy Evaluation Patient Details Name: Erik Page MRN: [id number] DOB: 1951/05/29 Today's Date: 10/01/2021   History of Present Illness 71 y.o. male seen at the request of Dr. Alinda Dooms for what appears to be metastatic prostate cancer. He presented on 09/28/2021 to the emergency department due to weakness rectal bleeding nausea and lumbar pain.  His hemoglobin was 7 and he is received transfusion.  Upon evaluation chest x-ray showed an increased interstitial marking, CT abdomen pelvis with contrast showed an enlarged prostate with soft tissue infiltration of the surrounding periprosthetic fat bladder wall involvement and tumor extension into the surrounding tissues with diffuse nodal metastasis and widespread osseous disease.  The right kidney was edematous with signs of severe hydronephrosis and hydroureter the distal right ureter is likely obstructed by either pelvic adenopathy or tumor tumor invasion of the right UVJ.  A CT of the lumbar spine on 09/28/2021 showed diffuse sclerotic lesions throughout the spine and pelvis especially at L3-L4 moderate spinal canal stenosis and severe right neural foraminal narrowing as well as L4-5 moderate to severe right neural foraminal narrowing and L5-S1 moderate left and mild right neural foraminal narrowing was noted.  He is contacted to consider palliative radiotherapy.  He is scheduled to undergo nephrostomy placement and biopsy of the pelvic lymph node 10/01/21.   Clinical Impression   Chart reviewed, pt greeted supine in bed agreeable to OT evaluation. Co-tx completed with PT due to physical assistance previously required for mobility via chart review. Pt reports pain in managed at this time therefore ease of mobility is improved. PTA pt is I in all ADL/IADL, MOD I with increased time the last six weeks due to progressive weakness. On this date pt performs all ADL/functional mobility with MOD I-I; LB dressing performed with MAX A due to pain with forward  trunk flexion. Pt educated on use of AE, reports he will have his wife assist as needed. Pt and wife also educated on use of a tub transfer bench for safe bathing following discharge. All questions answered. Pt is left in bedside chair, NAD, all needs met. Pt has no further OT needs at this time. OT will sign up. Please re-consult if there is a change in functional status.      Recommendations for follow up therapy are one component of a multi-disciplinary discharge planning process, led by the attending physician.  Recommendations may be updated based on patient status, additional functional criteria and insurance authorization.   Follow Up Recommendations  No OT follow up    Assistance Recommended at Discharge PRN  Patient can return home with the following      Functional Status Assessment  Patient has had a recent decline in their functional status and demonstrates the ability to make significant improvements in function in a reasonable and predictable amount of time.  Equipment Recommendations  Tub/shower seat;BSC/3in1    Recommendations for Other Services       Precautions / Restrictions Precautions Precautions: Fall Restrictions Weight Bearing Restrictions: No      Mobility Bed Mobility Overal bed mobility: Modified Independent             General bed mobility comments: HOB up, use of railings    Transfers Overall transfer level: Independent Equipment used: None                      Balance Overall balance assessment: Independent  ADL either performed or assessed with clinical judgement   ADL Overall ADL's : Needs assistance/impaired;Modified independent                                       General ADL Comments: Pt MOD I for all ADL except LB dressing requires MAX A on this date. Pt educated on use of sock aid/reacher/hip kit if required in the future. Pt wife will assist as  needed per pt report.     Vision         Perception     Praxis      Pertinent Vitals/Pain Pain Assessment: No/denies pain     Hand Dominance     Extremity/Trunk Assessment Upper Extremity Assessment Upper Extremity Assessment: Overall WFL for tasks assessed;RUE deficits/detail RUE Deficits / Details: tremor noted throughout B hands; does not affect FMC/package container management at this time per pt report this is baseline   Lower Extremity Assessment Lower Extremity Assessment: Overall WFL for tasks assessed RLE Deficits / Details: knee ext 5/5, ankle PF/DF 5/5 RLE Sensation: WNL LLE Deficits / Details: knee ext 5/5, ankle PF/DF 5/5 LLE Sensation: WNL LLE Coordination: WNL   Cervical / Trunk Assessment Cervical / Trunk Assessment: Normal   Communication Communication Communication: No difficulties   Cognition Arousal/Alertness: Awake/alert Behavior During Therapy: WFL for tasks assessed/performed Overall Cognitive Status: Within Functional Limits for tasks assessed                                 General Comments: alert and oriented x4     General Comments       Exercises Other Exercises Other Exercises: educated re: role of OT, role of rehab, pt and wife educated re: use of AE for home safety (sock aid/hip kit/reacher, tub/shower transfer bench)   Shoulder Instructions      Home Living Family/patient expects to be discharged to:: Private residence Living Arrangements: Spouse/significant other Available Help at Discharge: Family;Available 24 hours/day Type of Home: House Home Access: Level entry     Home Layout: Multi-level;Able to live on main level with bedroom/bathroom Alternate Level Stairs-Number of Steps: flight of stairs up to bedroom, can live on main level; main level has tub/shower combo   Bathroom Shower/Tub: Teacher, early years/pre: Standard     Home Equipment: None          Prior Functioning/Environment  Prior Level of Function : Independent/Modified Independent;Driving             Mobility Comments: I with no AD ADLs Comments: pt completes all ADL/IADL with independence- MOD I with increased time for ~the past six weeks; pt is a retired Pension scheme manager        OT Problem List: Decreased activity tolerance      OT Treatment/Interventions:      OT Goals(Current goals can be found in the care plan section) Acute Rehab OT Goals Patient Stated Goal: to get stronger and go home OT Goal Formulation: With patient Time For Goal Achievement: 10/15/21 Potential to Achieve Goals: Good  OT Frequency:      Co-evaluation PT/OT/SLP Co-Evaluation/Treatment: Yes Reason for Co-Treatment: To address functional/ADL transfers PT goals addressed during session: Mobility/safety with mobility;Balance        AM-PAC OT "6 Clicks" Daily Activity     Outcome Measure Help  from another person eating meals?: None Help from another person taking care of personal grooming?: None Help from another person toileting, which includes using toliet, bedpan, or urinal?: None Help from another person bathing (including washing, rinsing, drying)?: None Help from another person to put on and taking off regular upper body clothing?: None Help from another person to put on and taking off regular lower body clothing?: A Lot 6 Click Score: 22   End of Session Equipment Utilized During Treatment: Gait belt Nurse Communication: Mobility status  Activity Tolerance: Patient tolerated treatment well Patient left: in chair;with call bell/phone within reach;with chair alarm set;with family/visitor present  OT Visit Diagnosis: Unsteadiness on feet (R26.81)                Time: 5456-2563 OT Time Calculation (min): 29 min Charges:  OT General Charges $OT Visit: 1 Visit OT Evaluation $OT Eval Moderate Complexity: 1 Mod  Shanon Payor, OTD OTR/L  10/01/21, 11:51 AM

## 2021-10-02 ENCOUNTER — Ambulatory Visit
Admission: RE | Admit: 2021-10-02 | Discharge: 2021-10-02 | Disposition: A | Discharge status: Home / Self Care | Payer: Medicare Other | Source: Ambulatory Visit | Attending: Radiation Oncology | Admitting: Radiation Oncology

## 2021-10-02 DIAGNOSIS — C7951 Secondary malignant neoplasm of bone: Secondary | ICD-10-CM | POA: Insufficient documentation

## 2021-10-02 DIAGNOSIS — C61 Malignant neoplasm of prostate: Secondary | ICD-10-CM | POA: Insufficient documentation

## 2021-10-02 LAB — CBC WITH DIFFERENTIAL/PLATELET
Abs Immature Granulocytes: 0.15 10*3/uL — ABNORMAL HIGH (ref 0.00–0.07)
Basophils Absolute: 0 10*3/uL (ref 0.0–0.1)
Basophils Relative: 1 %
Eosinophils Absolute: 0.1 10*3/uL (ref 0.0–0.5)
Eosinophils Relative: 1 %
HCT: 22.7 % — ABNORMAL LOW (ref 39.0–52.0)
Hemoglobin: 7.2 g/dL — ABNORMAL LOW (ref 13.0–17.0)
Immature Granulocytes: 4 %
Lymphocytes Relative: 23 %
Lymphs Abs: 0.8 10*3/uL (ref 0.7–4.0)
MCH: 29.3 pg (ref 26.0–34.0)
MCHC: 31.7 g/dL (ref 30.0–36.0)
MCV: 92.3 fL (ref 80.0–100.0)
Monocytes Absolute: 0.3 10*3/uL (ref 0.1–1.0)
Monocytes Relative: 9 %
Neutro Abs: 2.2 10*3/uL (ref 1.7–7.7)
Neutrophils Relative %: 62 %
Platelets: 141 10*3/uL — ABNORMAL LOW (ref 150–400)
RBC: 2.46 MIL/uL — ABNORMAL LOW (ref 4.22–5.81)
RDW: 15.5 % (ref 11.5–15.5)
WBC: 3.5 10*3/uL — ABNORMAL LOW (ref 4.0–10.5)
nRBC: 1.7 % — ABNORMAL HIGH (ref 0.0–0.2)

## 2021-10-02 LAB — BASIC METABOLIC PANEL
Anion gap: 9 (ref 5–15)
BUN: 14 mg/dL (ref 8–23)
CO2: 26 mmol/L (ref 22–32)
Calcium: 7.7 mg/dL — ABNORMAL LOW (ref 8.9–10.3)
Chloride: 106 mmol/L (ref 98–111)
Creatinine, Ser: 1.02 mg/dL (ref 0.61–1.24)
GFR, Estimated: >60 mL/min (ref >60)
Glucose, Bld: 106 mg/dL — ABNORMAL HIGH (ref 70–99)
Potassium: 3.4 mmol/L — ABNORMAL LOW (ref 3.5–5.1)
Sodium: 141 mmol/L (ref 135–145)

## 2021-10-02 MED ORDER — SENNOSIDES-DOCUSATE SODIUM 8.6-50 MG PO TABS: 1.0000 | ORAL_TABLET | Freq: Every evening | ORAL | 0 refills | Status: AC | PRN

## 2021-10-02 MED ORDER — ENSURE ENLIVE PO LIQD: 237.0000 mL | Freq: Two times a day (BID) | ORAL | 12 refills | Status: AC

## 2021-10-02 MED ORDER — ONDANSETRON HCL 4 MG PO TABS: 4.0000 mg | ORAL_TABLET | Freq: Four times a day (QID) | ORAL | 0 refills | Status: DC | PRN

## 2021-10-02 MED ORDER — IBUPROFEN 600 MG PO TABS: 600.0000 mg | ORAL_TABLET | Freq: Three times a day (TID) | ORAL | 0 refills | Status: DC

## 2021-10-02 MED ORDER — OXYCODONE HCL 5 MG PO TABS: 5.0000 mg | ORAL_TABLET | ORAL | 0 refills | Status: DC | PRN

## 2021-10-02 MED ORDER — OXYCODONE-ACETAMINOPHEN 5-325 MG PO TABS: 1.0000 | ORAL_TABLET | ORAL | 0 refills | Status: DC | PRN

## 2021-10-02 NOTE — Progress Notes (Signed)
I spoke with the patient and his wife today in our clinic. We discussed his MRI results, and that fortunately there does not appear to be concern for compromise to his spinal canal. We plan to offer radiotherapy to the lumbar spine and localize radiation to the level of the thoracic spine where he's having pain into his upper trunk. Written consent is obtained and placed in the chart, a copy was provided to the patient. We will start treatment on Thursday. He is planning to go home today and see Dr. [name] in the outpatient setting as well.     Carola Rhine, PAC

## 2021-10-02 NOTE — Plan of Care (Signed)
  Problem: Activity: Goal: Risk for activity intolerance will decrease Outcome: Progressing   Problem: Nutrition: Goal: Adequate nutrition will be maintained Outcome: Progressing   Problem: Coping: Goal: Level of anxiety will decrease Outcome: Progressing   Problem: Pain Managment: Goal: General experience of comfort will improve Outcome: Progressing   Problem: Safety: Goal: Ability to remain free from injury will improve Outcome: Progressing   

## 2021-10-02 NOTE — Progress Notes (Addendum)
Referring Physician(s): Herrick,B  Supervising Physician: Markus Daft  Patient Status:  Erik Page - In-pt  Chief Complaint:  Metastatic prostate cancer, back pain, right hydronephrosis  Subjective: Pt doing ok, has some back discomfort; did report some night sweats overnight; denies fever, resp issues   Allergies: Patient has no known allergies.  Medications: Prior to Admission medications   Medication Sig Start Date End Date Taking? Authorizing Provider  aspirin EC 81 MG EC tablet Take 1 tablet (81 mg total) by mouth daily. 08/16/15  Yes Lyda Jester M, PA-C  atorvastatin (LIPITOR) 80 MG tablet Take 1 tablet (80 mg total) by mouth daily. TAKE ONE TABLET BY MOUTH DAILY  **MUST CALL MD FOR APPOINTMENT Patient taking differently: Take 80 mg by mouth daily. 09/19/21  Yes BranchAlphonse Guild, MD  ibuprofen (ADVIL) 200 MG tablet Take 800 mg by mouth every 6 (six) hours as needed for moderate pain.   Yes [provider]  losartan (COZAAR) 25 MG tablet Take 0.5 tablets (12.5 mg total) by mouth daily. TAKE ONE-HALF TABLET BY MOUTH DAILY ( NEEDS TO BE SEEN) Patient taking differently: Take 12.5 mg by mouth daily. 09/19/21  Yes Branch, Alphonse Guild, MD  metoprolol tartrate (LOPRESSOR) 25 MG tablet Take 1 tablet (25 mg total) by mouth 2 (two) times daily. Patient taking differently: Take 25 mg by mouth daily. 09/19/21  Yes Branch, Alphonse Guild, MD  nitroGLYCERIN (NITROSTAT) 0.4 MG SL tablet DISSOLVE 1 TAB UNDER TONGUE FOR CHEST PAIN - IF PAIN REMAINS AFTER 5 MIN, CALL 911 AND REPEAT DOSE. MAX 3 TABS IN 15 MINUTES Patient taking differently: 0.4 mg every 5 (five) minutes as needed for chest pain. 09/19/21  Yes BranchAlphonse Guild, MD  feeding supplement (ENSURE ENLIVE / ENSURE PLUS) LIQD Take 237 mLs by mouth 2 (two) times daily between meals. 10/02/21   Nita Sells, MD  ibuprofen (ADVIL) 600 MG tablet Take 1 tablet (600 mg total) by mouth 3 (three) times daily. 10/02/21   Nita Sells, MD  ondansetron (ZOFRAN) 4 MG tablet Take 1 tablet (4 mg total) by mouth every 6 (six) hours as needed for nausea. 10/02/21   Nita Sells, MD  oxyCODONE (OXY IR/ROXICODONE) 5 MG immediate release tablet Take 1 tablet (5 mg total) by mouth every 4 (four) hours as needed for moderate pain. 10/02/21   Nita Sells, MD  oxyCODONE-acetaminophen (PERCOCET/ROXICET) 5-325 MG tablet Take 1-2 tablets by mouth every 4 (four) hours as needed for moderate pain. 10/02/21   Nita Sells, MD  senna-docusate (SENOKOT-S) 8.6-50 MG tablet Take 1 tablet by mouth at bedtime as needed for mild constipation. 10/02/21   Nita Sells, MD     Vital Signs: BP (!) 103/58 (BP Location: Left Arm)    Pulse 78    Temp 98.1 F (36.7 C) (Oral)    Resp 18    Ht _0  (1.88 m)    Wt 211 lb 10.3 oz (96 kg)    SpO2 97%    BMI 27.17 kg/m   Physical Exam awake/alert; rt PCN intact, dressing dry, site mildly tender, OP 2.4 liters blood-tinged urine; left RP/flank  bx site ok, NT  Imaging: DG Chest 2 View  Result Date: 09/28/2021 CLINICAL DATA:  shortness of breath EXAM: CHEST - 2 VIEW COMPARISON:  None. FINDINGS: The heart and mediastinal contours are within normal limits. Elevated left hemidiaphragm. No focal consolidation. Increased interstitial markings. No pleural effusion. No pneumothorax. No acute osseous abnormality. IMPRESSION: Increased interstitial markings. Query underlying  infection/inflammation. Electronically Signed   By: Iven Finn M.D.   On: 09/28/2021 15:54   MR THORACIC SPINE W WO CONTRAST  Result Date: 10/02/2021 CLINICAL DATA:  Initial evaluation for back pain, history of prostate cancer. EXAM: MRI THORACIC WITHOUT AND WITH CONTRAST TECHNIQUE: Multiplanar and multiecho pulse sequences of the thoracic spine were obtained without and with intravenous contrast. CONTRAST:  74mL GADAVIST GADOBUTROL 1 MMOL/ML IV SOLN COMPARISON:  None available. FINDINGS: Alignment:   Examination degraded by motion artifact. Vertebral bodies normally aligned with preservation of the normal thoracic kyphosis. No listhesis. Vertebrae: Diffusely abnormal heterogeneous signal intensity seen throughout the visualized bone marrow, consistent with widespread osseous metastatic disease. Associated heterogeneous postcontrast enhancement. Measurement of a discrete lesion is difficult given the diffusely infiltrative nature of this finding. No visible extra osseous or epidural extension of tumor. Mild chronic height loss noted at the superior endplate of T8. No acute pathologic fracture or other complication. Cord: Normal signal and morphology. No visible intracanalicular or epidural tumor. No abnormal enhancement. Paraspinal and other soft tissues: Paraspinous soft tissues demonstrate no acute finding. Small to moderate layering bilateral pleural effusions, right larger than left. Right-sided hydronephrosis partially visualized. Disc levels: T6-7: Central to left paracentral disc protrusion indents the ventral thecal sac, contacting and mildly flattening the ventral cord (series 20, image 7). No significant spinal stenosis. Foramina remain patent. T8-9: Central disc protrusion indents the ventral thecal sac, contacting and mildly flattening the ventral cord, but with no cord signal changes or significant spinal stenosis. Foramina remain patent. Otherwise, no other significant disc pathology seen elsewhere within the thoracic spine. No other significant spinal stenosis. Foramina remain patent. IMPRESSION: 1. Widespread osseous metastatic disease throughout the thoracic spine. No extra osseous or epidural extension of tumor. No acute pathologic fracture or other complication. 2. Central to left paracentral disc protrusions at T6-7 and T8-9 without significant stenosis. 3. Small to moderate layering bilateral pleural effusions, right larger than left. 4. Right-sided hydronephrosis, partially visualized.  Electronically Signed   By: Jeannine Boga M.D.   On: 10/02/2021 01:08   MR Lumbar Spine W Wo Contrast  Result Date: 10/02/2021 CLINICAL DATA:  Initial evaluation for metastatic disease. Neck pain, history of prostate cancer. EXAM: MRI LUMBAR SPINE WITHOUT AND WITH CONTRAST TECHNIQUE: Multiplanar and multiecho pulse sequences of the lumbar spine were obtained without and with intravenous contrast. CONTRAST:  54mL GADAVIST GADOBUTROL 1 MMOL/ML IV SOLN COMPARISON:  Prior CTs from 09/28/2021. FINDINGS: Segmentation: Standard. Lowest well-formed disc space labeled the L5-S1 level. Alignment: Mild sigmoid scoliotic curvature. Trace degenerative anterolisthesis of L3 on L4, with trace retrolisthesis of L5 on S1. Alignment otherwise normal preservation of the normal lumbar lordosis. Vertebrae: Diffusely abnormal heterogeneous appearance seen throughout the visualized bone marrow, consistent with widespread osseous metastatic disease. There is diffuse involvement of all levels of the visualized lumbar spine, with diffuse heterogeneous involvement of the visualized sacrum and pelvis as above. Vertebral body height maintained without associated pathologic fracture. No visible extra osseous or epidural extension of tumor. Conus medullaris and cauda equina: Conus extends to the L1 level. Conus and cauda equina appear normal. No intracanalicular or epidural tumor. Paraspinal and other soft tissues: Prominent retroperitoneal and pelvic adenopathy, concerning for nodal metastatic disease. Right-sided hydroureteronephrosis, partially visualized, better characterized on prior CT. Few scattered superimposed renal cysts noted bilaterally, a few of which demonstrates some intrinsic T1 hyperintensity. Largest of these measures 1.5 cm at the interpolar region of the right kidney (series 12, image 9). Paraspinous soft  tissues demonstrate no other acute finding. Disc levels: L1-2:  Negative interspace.  Mild facet hypertrophy.   No stenosis. L2-3: Degenerative intervertebral disc space narrowing with diffuse disc bulge and reactive endplate change. Superimposed left foraminal to extraforaminal disc protrusion closely approximates the exiting left L2 nerve root (series 11, image 17). Mild to moderate bilateral facet hypertrophy. Resultant mild narrowing of the right lateral recess. Central canal remains patent. No significant foraminal encroachment. L3-4: Trace anterolisthesis. Degenerative intervertebral disc space narrowing with diffuse disc bulge and disc desiccation. Superimposed small right foraminal disc protrusion contacts the exiting right L3 nerve root (series 11, image 21). Moderate right with mild left facet hypertrophy. Resultant mild canal with moderate right lateral recess stenosis, with moderate right L3 foraminal narrowing. Left neural foramina remains patent. L4-5: Degenerative intervertebral disc space narrowing with diffuse disc bulge and disc desiccation. Mild reactive endplate spurring. Superimposed biforaminal disc protrusions contact and/or closely approximates both of the exiting L4 nerve roots as they course through the neural foramina (series 13, images 4, 13). Moderate right with mild-to-moderate left facet hypertrophy. Resultant mild narrowing of the right lateral recess. Mild bilateral L4 foraminal stenosis. L5-S1: Trace retrolisthesis. Degenerative intervertebral disc space narrowing with diffuse disc bulge and disc desiccation. Associated reactive endplate spurring, greater on the left. Superimposed shallow central to left subarticular disc protrusion closely approximates the descending left S1 nerve root (series 11, image 35). Mild to moderate bilateral facet hypertrophy. No significant spinal stenosis. Mild right with moderate left L5 foraminal stenosis. IMPRESSION: 1. Widespread osseous metastatic disease throughout the lumbar spine and visualized sacrum/pelvis. No associated pathologic fracture. No extra  osseous extension or epidural tumor identified. 2. Prominent retroperitoneal and pelvic adenopathy, concerning for nodal metastatic disease. 3. Right-sided hydroureteronephrosis, grossly stable from prior CT from 09/28/2021. 4. Underlying moderate multilevel degenerative spondylosis with small disc protrusions at L2-3 through L5-S1 as detailed above. Resultant mild to moderate right lateral recess narrowing at L3-4 and L4-5. 5. Multiple scattered renal cysts, a few of which demonstrate intrinsic T1 hyperintensity within the right kidney, indeterminate. Further evaluation with dedicated renal mass protocol CT and/or MRI suggested for complete evaluation. Electronically Signed   By: Jeannine Boga M.D.   On: 10/02/2021 01:25   CT Abdomen Pelvis W Contrast  Result Date: 09/28/2021 CLINICAL DATA:  Evaluate for bowel perforation. Abdominal pain, nonlocalized. Complains of weakness and rectal bleeding. EXAM: CT ABDOMEN AND PELVIS WITH CONTRAST TECHNIQUE: Multidetector CT imaging of the abdomen and pelvis was performed using the standard protocol following bolus administration of intravenous contrast. CONTRAST:  88m OMNIPAQUE IOHEXOL 350 MG/ML SOLN COMPARISON:  None. FINDINGS: Lower chest: No acute abnormality. Hepatobiliary: No focal liver abnormality is seen. Tiny stones are noted layering within the dependent portion of the gallbladder measuring up to 3 mm. No signs of gallbladder wall inflammation or bile duct dilatation. Pancreas: Unremarkable. No pancreatic ductal dilatation or surrounding inflammatory changes. Spleen: There is a small linear hypodensity within the posterior spleen measuring 1.3 cm. This is of uncertain clinical significance. Signs of adjacent rib fractures are identified which appears subacute to chronic. Adrenals/Urinary Tract: The adrenal glands are normal. Small left kidney cysts are identified. Left kidney is otherwise unremarkable. The right kidney appears diffusely edematous and  there is severe right hydronephrosis and hydroureter. Markedly enlarged prostate gland is identified with mass effect upon the base of bladder with suspected tumor infiltration along the posterior wall of bladder, image 91/6. Stomach/Bowel: Stomach appears normal. The appendix is visualized and appears normal. No  bowel wall thickening, inflammation, or distension. Vascular/Lymphatic: Aortic atherosclerosis without aneurysm. Extensive bilateral retroperitoneal and bilateral pelvic adenopathy identified compatible with metastatic disease. Index retrocaval lymph node measures 1.7 cm, image 35/2. Index left retroperitoneal lymph node measures 2.2 cm, image 38/2. Left common iliac lymph node is enlarged measuring 2 cm, image 55/2. Right pelvic sidewall lymph node measures 2.2 cm, image 71/2. Left pelvic sidewall lymph node measures 1.8 cm, image 72/2. Reproductive: The prostate gland is enlarged. The margins of the superior portions of the prostate gland are irregular with nodular soft tissue infiltration into the surrounding periprosthetic fat. Findings are worrisome for prostate cancer. Other: No free fluid or fluid collections identified within the abdomen or pelvis. Musculoskeletal: Widespread sclerotic metastases are identified throughout the visualized axial and appendicular skeleton. Degenerative disc disease noted within the lumbar spine. IMPRESSION: 1. Enlarged prostate gland with nodular soft tissue infiltration into the surrounding periprosthetic fat. Signs of bladder wall involvement are identified. Findings are worrisome for prostate cancer with tumor extension into the surrounding soft tissues, diffuse nodal metastasis, and widespread osseous metastatic disease. 2. The right kidney is markedly edematous with signs of severe hydronephrosis and hydroureter. The distal right ureter is likely obstructed by either pelvic adenopathy or tumor invasion at the right UVJ. 3. Gallstones. 4. There is a linear  hypodensity within the inferior spleen. Although no secondary signs of acute splenic injury there is adjacent subacute to chronic left rib fractures. Findings may reflect sequelae of old splenic injury. Correlate for any clinical history of trauma to the left upper quadrant abdominal wall. 5. Aortic Atherosclerosis (ICD10-I70.0). Electronically Signed   By: Kerby Moors M.D.   On: 09/28/2021 17:54   CT L-SPINE NO CHARGE  Result Date: 09/28/2021 CLINICAL DATA:  Weakness, rectal bleeding, nausea, lumbar pain EXAM: CT LUMBAR SPINE WITHOUT CONTRAST TECHNIQUE: Multidetector CT imaging of the lumbar spine was performed without intravenous contrast administration. Multiplanar CT image reconstructions were also generated. COMPARISON:  None. FINDINGS: Segmentation: 5 lumbar type vertebrae. Alignment: Levocurvature of the lumbar spine. Straightening of the normal lumbar lordosis. Trace retrolisthesis L5 on S1. Vertebrae: Diffuse sclerotic lesions throughout the imaged spine and pelvis, concerning for diffuse osseous metastatic disease. No acute fracture. Vertebral body heights are preserved. Paraspinal and other soft tissues: Please see same-day CT abdomen pelvis. Disc levels: T12-L1: No significant disc bulge. No spinal canal stenosis or neural foraminal narrowing. L1-L2: No significant disc bulge. No spinal canal stenosis or neural foraminal narrowing. L2-L3: Mild disc bulge. Moderate facet arthropathy. No spinal canal stenosis or neural foraminal narrowing. L3-L4: Large disc bulge. Severe right and mild left facet arthropathy. Moderate spinal canal stenosis. Severe right neural foraminal narrowing. L4-L5: Moderate disc bulge. Moderate to severe right and mild left facet arthropathy. No spinal canal stenosis. Moderate to severe right neural foraminal narrowing. L5-S1: Trace retrolisthesis and mild disc bulge. Moderate facet arthropathy. No spinal canal stenosis. Mild right and moderate left neural foraminal narrowing.  IMPRESSION: 1. Diffuse sclerotic lesions throughout the imaged spine and pelvis, concerning for diffuse osseous metastatic disease. 2. No acute vertebral body fracture. 3. L3-L4 moderate spinal canal stenosis and severe right neural foraminal narrowing. 4. L4-L5 moderate to severe right neural foraminal narrowing. 5. L5-S1 moderate left and mild right neural foraminal narrowing. Electronically Signed   By: Merilyn Baba M.D.   On: 09/28/2021 17:52   CT BIOPSY  Result Date: 10/01/2021 INDICATION: 71 year old gentleman with enlarged retroperitoneal lymph nodes presents to IR for CT-guided biopsy. EXAM: CT-guided biopsy of enlarged  left periaortic lymph node MEDICATIONS: None. ANESTHESIA/SEDATION: Moderate (conscious) sedation was employed during this procedure. A total of Versed 2 mg and Fentanyl 100 mcg was administered intravenously. Moderate Sedation Time: 18 minutes. The patient's level of consciousness and vital signs were monitored continuously by radiology nursing throughout the procedure under my direct supervision. COMPLICATIONS: None immediate. PROCEDURE: Informed written consent was obtained from the patient after a thorough discussion of the procedural risks, benefits and alternatives. All questions were addressed. Maximal Sterile Barrier Technique was utilized including caps, mask, sterile gowns, sterile gloves, sterile drape, hand hygiene and skin antiseptic. A timeout was performed prior to the initiation of the procedure. Patient position prone on the CT table. Left flank skin prepped and draped in usual sterile fashion. Following local lidocaine administration, 17 gauge introducer needle was advanced into the enlarged left retroperitoneal lymph node utilizing CT guidance. Four cores were obtained from the enlarged left retroperitoneal lymph node and sent to pathology in sterile saline. Needle removed and hemostasis achieved with manual compression. IMPRESSION: CT-guided biopsy enlarged left  retroperitoneal lymph node. Electronically Signed   By: Miachel Roux M.D.   On: 10/01/2021 14:51   IR NEPHROSTOMY PLACEMENT RIGHT  Result Date: 10/01/2021 INDICATION: 71 year old gentleman with the right hydronephrosis presents to IR for percutaneous nephrostomy drain placement. EXAM: Right percutaneous nephrostomy drain COMPARISON:  None. MEDICATIONS: Rocephin 2 g IV; The antibiotic was administered in an appropriate time frame prior to skin puncture. ANESTHESIA/SEDATION: Fentanyl 2 mcg IV; Versed 50 mg IV Moderate Sedation Time:  12 minutes The patient was continuously monitored during the procedure by the interventional radiology nurse under my direct supervision. CONTRAST:  5 mL of Omnipaque 300-administered into the collecting system(s) FLUOROSCOPY TIME:  Fluoroscopy Time: 1 minutes 6 seconds (8 mGy). COMPLICATIONS: None immediate. PROCEDURE: Informed written consent was obtained from the patient after a thorough discussion of the procedural risks, benefits and alternatives. All questions were addressed. Maximal Sterile Barrier Technique was utilized including caps, mask, sterile gowns, sterile gloves, sterile drape, hand hygiene and skin antiseptic. A timeout was performed prior to the initiation of the procedure. Patient position prone on the procedure table. Right flank prepped and draped usual fashion. Utilizing continuous ultrasound guidance, a 21 gauge needle was utilized to access a mid pole calyx urine return was noted from the needle hub. The 21 gauge needle was exchanged for a transitional dilator set over 0.018 inch guidewire. Transitional dilator set exchanged for 10 French multipurpose pigtail drain over 0.035 inch guidewire. Sample of urine was aspirated and sent for Gram stain and culture. Contrast administered through the drain confirmed appropriate positioning of the nephrostomy drain. The drain was secured to skin with suture and connected to bag. IMPRESSION: Successful placement of 10  French right nephrostomy drain. PLAN: When patient condition allows, patient can be brought back for ureteral stent placement. Electronically Signed   By: Miachel Roux M.D.   On: 10/01/2021 14:57    Labs:  CBC: Recent Labs    09/29/21 0602 09/30/21 0553 10/01/21 0539 10/02/21 0922  WBC 4.6 5.3 4.5 3.5*  HGB 6.3* 7.5* 8.2* 7.2*  HCT 19.9* 23.1* 25.4* 22.7*  PLT 140* 136* 156 141*    COAGS: Recent Labs    10/01/21 0539  INR 1.3*    BMP: Recent Labs    09/29/21 0602 09/30/21 0553 10/01/21 0539 10/02/21 0922  NA 135 136 139 141  K 4.2 3.9 3.7 3.4*  CL 100 102 103 106  CO2 $Re'24 24 25 'Fak$ 26  GLUCOSE 133* 106* 125* 106*  BUN _0 CALCIUM 8.2* 7.7* 8.0* 7.7*  CREATININE 1.15 1.22 1.14 1.02  GFRNONAA >60 >60 >60 >60    LIVER FUNCTION TESTS: Recent Labs    09/28/21 1607 09/29/21 0602 09/30/21 0553 10/01/21 0539  BILITOT 1.8* 1.7* 1.7* 1.4*  AST _1 ALT _2 ALKPHOS 358* 283* 244* 259*  PROT 7.2 6.2* 6.1* 6.2*  ALBUMIN 2.9* 2.5* 2.4* 2.4*    Assessment and Plan: Pt with hx met prostate cancer, obstructive rt hydronephrosis; s/p left RP LN bx/ rt PCN yesterday; afebrile; urine cx pend; path pending; plans notd for d/c home today per [facility]; case d/w Dr Louis Meckel- will put pt on our OP schedule for PCN exchange with poss JJ stent placement in 6 weeks; as OP pt should record drain OP and change overlying gauze dressing every 1-2 days; call [phone] with any drain related questions   Electronically Signed: D. Rowe Robert, PA-C 10/02/2021, 10:30 AM   I spent a total of 15 Minutes at the the patient's bedside AND on the patient's hospital floor or unit, greater than 50% of which was counseling/coordinating care for right percutaneous nephrostomy    Patient ID: Erik Page, male   DOB: 06-16-1951, 71 y.o.   MRN: [id number]

## 2021-10-02 NOTE — Discharge Summary (Signed)
Physician Discharge Summary  Erik Page ZRA:[id number] DOB: [birth year] DOA: 09/28/2021  PCP: [name], DO  Admit date: 09/28/2021 Discharge date: 10/02/2021  Time spent: 36 minutes  Recommendations for Outpatient Follow-up:  Needs Chem-12, CBC in about 1 week Recommending admission at lower doses of losartan if felt appropriate in the outpatient setting by PCP Will need staging and evaluation periodically by both medical and radiation oncology and will need follow-up with Dr. 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  Obstruction 2/2 pelvic adenopathy versus tumor invasion CT L-spine = sclerotic lesion-L3-4 moderate stenosis, L4-5 moderate right foraminal narrowing Patient hydrated with 2 L of saline Dr. Louis Meckel of urology were consulted, Dr. Michail Sermon GI was consulted as well because of positive guaiac    Please see below for itemized issues addressed in [place]- refer to other progress notes for clarity if needed  Discharge Condition: fair  Diet recommendation: h healthy  Filed Weights   09/28/21 1430 09/28/21 2223  Weight: 96.2 kg 96 kg    History of present illness:  Anemia likely secondary to malignancy GI feels this is likely hemorrhoidal and have signed off-no  reports of further bleeding Received 1 unit PRBC -hemoglobin has improved--follow repeat labs but probably will discharge today with outpatient reevaluation of labs Low back pain progressive weakness weight loss PSA above 1400--likely prostatic cancer Radiation and medical oncology have seen patient with plans for outpatient further consolidation of care--started on Degarelix Going for diagnostic CT-guided biopsy and percutaneous nephrostomy placement successfully performed 1/9 Pain control with increased Oxy IR every 4 to Percocet 2 tablets every 4 as needed moderate pain--prescription written for discharge First-line-- added back ibuprofen 600 3 times daily Fevers Likely secondary to underlying prostate issues-no current fever over 100.5 UA from admission was negative for leukocytes Metastatic prostate cancer hydroureter extension into bladder wall and hydronephrosis Was on IV fluid-saline lock 1/9 CAD with PCI 2016 Aspirin held at this time Holding losartan, continue metoprolol 25 twice daily and Lipitor 80 mg 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 Stop losartan,-labs in the morning   Discharge Exam: Vitals:   10/01/21 1916 10/02/21 0441  BP: 102/61 (!) 103/58  Pulse: 93 78  Resp:  18  Temp:  98.1 F (36.7 C)  SpO2:  97%    Subj on day of d/c   Some discomfort in his back at site of urostomy, no chest pain no fever, no dysuria ROM intact Walked well with therapy yesterday  General Exam on discharge  EOMI NCAT no focal deficit CTA B no rales no rhonchi S1-S2 no murmur no rub no gallop Abdomen soft no rebound no guarding-urostomy now in place Moving all 4 limbs equally  Discharge Instructions   Discharge Instructions     Diet - low sodium heart healthy   Complete by: As directed    Discharge instructions   Complete by:  As directed    You will notice several of your medications have changed a little bit and we have taken you off of your losartan at night You can use  ibuprofen for first-line control of pain but use a combination of Percocet and oxycodone to make 10 mg of oxycodone and use that around-the-clock if you have pain Please follow-up with your oncologist with regards to further steps--urology will also follow you and they will help direct the rest of your care   Increase activity slowly   Complete by: As directed    No wound care   Complete by: As directed       Allergies as of 72/06/2022   No Known Allergies      Medication List     STOP taking these medications    losartan 25 MG tablet Commonly known as: COZAAR       TAKE these medications    aspirin 81 MG EC tablet Take 1 tablet (81 mg total) by mouth daily.   atorvastatin 80 MG tablet Commonly known as: LIPITOR Take 1 tablet (80 mg total) by mouth daily. TAKE ONE TABLET BY MOUTH DAILY  **MUST CALL MD FOR APPOINTMENT What changed: additional instructions   feeding supplement Liqd Take 237 mLs by mouth 2 (two) times daily between meals.   ibuprofen 600 MG tablet Commonly known as: ADVIL Take 1 tablet (600 mg total) by mouth 3 (three) times daily. What changed:  medication strength how much to take when to take this reasons to take this   metoprolol tartrate 25 MG tablet Commonly known as: LOPRESSOR Take 1 tablet (25 mg total) by mouth 2 (two) times daily. What changed: when to take this   nitroGLYCERIN 0.4 MG SL tablet Commonly known as: NITROSTAT DISSOLVE 1 TAB UNDER TONGUE FOR CHEST PAIN - IF PAIN REMAINS AFTER 5 MIN, CALL 911 AND REPEAT DOSE. MAX 3 TABS IN 15 MINUTES What changed:  how much to take when to take this reasons to take this additional instructions   ondansetron 4 MG tablet Commonly known as: ZOFRAN Take 1 tablet (4 mg total) by mouth every 6 (six) hours as needed for nausea.   oxyCODONE 5 MG immediate release tablet Commonly known as: Oxy IR/ROXICODONE Take 1 tablet (5 mg total) by mouth every 4 (four) hours as needed for moderate  pain.   oxyCODONE-acetaminophen 5-325 MG tablet Commonly known as: PERCOCET/ROXICET Take 1-2 tablets by mouth every 4 (four) hours as needed for moderate pain.   senna-docusate 8.6-50 MG tablet Commonly known as: Senokot-S Take 1 tablet by mouth at bedtime as needed for mild constipation.       No Known Allergies    The results of significant diagnostics from this hospitalization (including imaging, microbiology, ancillary and laboratory) are listed below for reference.    Significant Diagnostic Studies: DG Chest 2 View  Result Date: 09/28/2021 CLINICAL DATA:  shortness of breath EXAM: CHEST - 2 VIEW COMPARISON:  None. FINDINGS: The heart and mediastinal contours are within normal limits. Elevated left hemidiaphragm. No focal consolidation. Increased interstitial markings. No pleural effusion. No pneumothorax. No acute osseous abnormality. IMPRESSION: Increased interstitial markings. Query underlying infection/inflammation. Electronically Signed   By: Iven Finn M.D.   On: 09/28/2021 15:54   MR THORACIC SPINE W WO CONTRAST  Result Date: 10/02/2021 CLINICAL DATA:  Initial evaluation for back pain, history of prostate cancer. EXAM: MRI THORACIC WITHOUT AND WITH CONTRAST TECHNIQUE: Multiplanar and multiecho pulse sequences of the thoracic spine were  obtained without and with intravenous contrast. CONTRAST:  6m GADAVIST GADOBUTROL 1 MMOL/ML IV SOLN COMPARISON:  None available. FINDINGS: Alignment:  Examination degraded by motion artifact. Vertebral bodies normally aligned with preservation of the normal thoracic kyphosis. No listhesis. Vertebrae: Diffusely abnormal heterogeneous signal intensity seen throughout the visualized bone marrow, consistent with widespread osseous metastatic disease. Associated heterogeneous postcontrast enhancement. Measurement of a discrete lesion is difficult given the diffusely infiltrative nature of this finding. No visible extra osseous or epidural  extension of tumor. Mild chronic height loss noted at the superior endplate of T8. No acute pathologic fracture or other complication. Cord: Normal signal and morphology. No visible intracanalicular or epidural tumor. No abnormal enhancement. Paraspinal and other soft tissues: Paraspinous soft tissues demonstrate no acute finding. Small to moderate layering bilateral pleural effusions, right larger than left. Right-sided hydronephrosis partially visualized. Disc levels: T6-7: Central to left paracentral disc protrusion indents the ventral thecal sac, contacting and mildly flattening the ventral cord (series 20, image 7). No significant spinal stenosis. Foramina remain patent. T8-9: Central disc protrusion indents the ventral thecal sac, contacting and mildly flattening the ventral cord, but with no cord signal changes or significant spinal stenosis. Foramina remain patent. Otherwise, no other significant disc pathology seen elsewhere within the thoracic spine. No other significant spinal stenosis. Foramina remain patent. IMPRESSION: 1. Widespread osseous metastatic disease throughout the thoracic spine. No extra osseous or epidural extension of tumor. No acute pathologic fracture or other complication. 2. Central to left paracentral disc protrusions at T6-7 and T8-9 without significant stenosis. 3. Small to moderate layering bilateral pleural effusions, right larger than left. 4. Right-sided hydronephrosis, partially visualized. Electronically Signed   By: BJeannine BogaM.D.   On: 10/02/2021 01:08   MR Lumbar Spine W Wo Contrast  Result Date: 10/02/2021 CLINICAL DATA:  Initial evaluation for metastatic disease. Neck pain, history of prostate cancer. EXAM: MRI LUMBAR SPINE WITHOUT AND WITH CONTRAST TECHNIQUE: Multiplanar and multiecho pulse sequences of the lumbar spine were obtained without and with intravenous contrast. CONTRAST:  133mGADAVIST GADOBUTROL 1 MMOL/ML IV SOLN COMPARISON:  Prior CTs from  09/28/2021. FINDINGS: Segmentation: Standard. Lowest well-formed disc space labeled the L5-S1 level. Alignment: Mild sigmoid scoliotic curvature. Trace degenerative anterolisthesis of L3 on L4, with trace retrolisthesis of L5 on S1. Alignment otherwise normal preservation of the normal lumbar lordosis. Vertebrae: Diffusely abnormal heterogeneous appearance seen throughout the visualized bone marrow, consistent with widespread osseous metastatic disease. There is diffuse involvement of all levels of the visualized lumbar spine, with diffuse heterogeneous involvement of the visualized sacrum and pelvis as above. Vertebral body height maintained without associated pathologic fracture. No visible extra osseous or epidural extension of tumor. Conus medullaris and cauda equina: Conus extends to the L1 level. Conus and cauda equina appear normal. No intracanalicular or epidural tumor. Paraspinal and other soft tissues: Prominent retroperitoneal and pelvic adenopathy, concerning for nodal metastatic disease. Right-sided hydroureteronephrosis, partially visualized, better characterized on prior CT. Few scattered superimposed renal cysts noted bilaterally, a few of which demonstrates some intrinsic T1 hyperintensity. Largest of these measures 1.5 cm at the interpolar region of the right kidney (series 12, image 9). Paraspinous soft tissues demonstrate no other acute finding. Disc levels: L1-2:  Negative interspace.  Mild facet hypertrophy.  No stenosis. L2-3: Degenerative intervertebral disc space narrowing with diffuse disc bulge and reactive endplate change. Superimposed left foraminal to extraforaminal disc protrusion closely approximates the exiting left L2 nerve root (series 11, image 17). Mild to moderate bilateral facet  hypertrophy. Resultant mild narrowing of the right lateral recess. Central canal remains patent. No significant foraminal encroachment. L3-4: Trace anterolisthesis. Degenerative intervertebral disc  space narrowing with diffuse disc bulge and disc desiccation. Superimposed small right foraminal disc protrusion contacts the exiting right L3 nerve root (series 11, image 21). Moderate right with mild left facet hypertrophy. Resultant mild canal with moderate right lateral recess stenosis, with moderate right L3 foraminal narrowing. Left neural foramina remains patent. L4-5: Degenerative intervertebral disc space narrowing with diffuse disc bulge and disc desiccation. Mild reactive endplate spurring. Superimposed biforaminal disc protrusions contact and/or closely approximates both of the exiting L4 nerve roots as they course through the neural foramina (series 13, images 4, 13). Moderate right with mild-to-moderate left facet hypertrophy. Resultant mild narrowing of the right lateral recess. Mild bilateral L4 foraminal stenosis. L5-S1: Trace retrolisthesis. Degenerative intervertebral disc space narrowing with diffuse disc bulge and disc desiccation. Associated reactive endplate spurring, greater on the left. Superimposed shallow central to left subarticular disc protrusion closely approximates the descending left S1 nerve root (series 11, image 35). Mild to moderate bilateral facet hypertrophy. No significant spinal stenosis. Mild right with moderate left L5 foraminal stenosis. IMPRESSION: 1. Widespread osseous metastatic disease throughout the lumbar spine and visualized sacrum/pelvis. No associated pathologic fracture. No extra osseous extension or epidural tumor identified. 2. Prominent retroperitoneal and pelvic adenopathy, concerning for nodal metastatic disease. 3. Right-sided hydroureteronephrosis, grossly stable from prior CT from 09/28/2021. 4. Underlying moderate multilevel degenerative spondylosis with small disc protrusions at L2-3 through L5-S1 as detailed above. Resultant mild to moderate right lateral recess narrowing at L3-4 and L4-5. 5. Multiple scattered renal cysts, a few of which demonstrate  intrinsic T1 hyperintensity within the right kidney, indeterminate. Further evaluation with dedicated renal mass protocol CT and/or MRI suggested for complete evaluation. Electronically Signed   By: Jeannine Boga M.D.   On: 10/02/2021 01:25   CT Abdomen Pelvis W Contrast  Result Date: 09/28/2021 CLINICAL DATA:  Evaluate for bowel perforation. Abdominal pain, nonlocalized. Complains of weakness and rectal bleeding. EXAM: CT ABDOMEN AND PELVIS WITH CONTRAST TECHNIQUE: Multidetector CT imaging of the abdomen and pelvis was performed using the standard protocol following bolus administration of intravenous contrast. CONTRAST:  52m OMNIPAQUE IOHEXOL 350 MG/ML SOLN COMPARISON:  None. FINDINGS: Lower chest: No acute abnormality. Hepatobiliary: No focal liver abnormality is seen. Tiny stones are noted layering within the dependent portion of the gallbladder measuring up to 3 mm. No signs of gallbladder wall inflammation or bile duct dilatation. Pancreas: Unremarkable. No pancreatic ductal dilatation or surrounding inflammatory changes. Spleen: There is a small linear hypodensity within the posterior spleen measuring 1.3 cm. This is of uncertain clinical significance. Signs of adjacent rib fractures are identified which appears subacute to chronic. Adrenals/Urinary Tract: The adrenal glands are normal. Small left kidney cysts are identified. Left kidney is otherwise unremarkable. The right kidney appears diffusely edematous and there is severe right hydronephrosis and hydroureter. Markedly enlarged prostate gland is identified with mass effect upon the base of bladder with suspected tumor infiltration along the posterior wall of bladder, image 91/6. Stomach/Bowel: Stomach appears normal. The appendix is visualized and appears normal. No bowel wall thickening, inflammation, or distension. Vascular/Lymphatic: Aortic atherosclerosis without aneurysm. Extensive bilateral retroperitoneal and bilateral pelvic adenopathy  identified compatible with metastatic disease. Index retrocaval lymph node measures 1.7 cm, image 35/2. Index left retroperitoneal lymph node measures 2.2 cm, image 38/2. Left common iliac lymph node is enlarged measuring 2 cm, image 55/2. Right pelvic sidewall  lymph node measures 2.2 cm, image 71/2. Left pelvic sidewall lymph node measures 1.8 cm, image 72/2. Reproductive: The prostate gland is enlarged. The margins of the superior portions of the prostate gland are irregular with nodular soft tissue infiltration into the surrounding periprosthetic fat. Findings are worrisome for prostate cancer. Other: No free fluid or fluid collections identified within the abdomen or pelvis. Musculoskeletal: Widespread sclerotic metastases are identified throughout the visualized axial and appendicular skeleton. Degenerative disc disease noted within the lumbar spine. IMPRESSION: 1. Enlarged prostate gland with nodular soft tissue infiltration into the surrounding periprosthetic fat. Signs of bladder wall involvement are identified. Findings are worrisome for prostate cancer with tumor extension into the surrounding soft tissues, diffuse nodal metastasis, and widespread osseous metastatic disease. 2. The right kidney is markedly edematous with signs of severe hydronephrosis and hydroureter. The distal right ureter is likely obstructed by either pelvic adenopathy or tumor invasion at the right UVJ. 3. Gallstones. 4. There is a linear hypodensity within the inferior spleen. Although no secondary signs of acute splenic injury there is adjacent subacute to chronic left rib fractures. Findings may reflect sequelae of old splenic injury. Correlate for any clinical history of trauma to the left upper quadrant abdominal wall. 5. Aortic Atherosclerosis (ICD10-I70.0). Electronically Signed   By: Kerby Moors M.D.   On: 09/28/2021 17:54   CT L-SPINE NO CHARGE  Result Date: 09/28/2021 CLINICAL DATA:  Weakness, rectal bleeding, nausea,  lumbar pain EXAM: CT LUMBAR SPINE WITHOUT CONTRAST TECHNIQUE: Multidetector CT imaging of the lumbar spine was performed without intravenous contrast administration. Multiplanar CT image reconstructions were also generated. COMPARISON:  None. FINDINGS: Segmentation: 5 lumbar type vertebrae. Alignment: Levocurvature of the lumbar spine. Straightening of the normal lumbar lordosis. Trace retrolisthesis L5 on S1. Vertebrae: Diffuse sclerotic lesions throughout the imaged spine and pelvis, concerning for diffuse osseous metastatic disease. No acute fracture. Vertebral body heights are preserved. Paraspinal and other soft tissues: Please see same-day CT abdomen pelvis. Disc levels: T12-L1: No significant disc bulge. No spinal canal stenosis or neural foraminal narrowing. L1-L2: No significant disc bulge. No spinal canal stenosis or neural foraminal narrowing. L2-L3: Mild disc bulge. Moderate facet arthropathy. No spinal canal stenosis or neural foraminal narrowing. L3-L4: Large disc bulge. Severe right and mild left facet arthropathy. Moderate spinal canal stenosis. Severe right neural foraminal narrowing. L4-L5: Moderate disc bulge. Moderate to severe right and mild left facet arthropathy. No spinal canal stenosis. Moderate to severe right neural foraminal narrowing. L5-S1: Trace retrolisthesis and mild disc bulge. Moderate facet arthropathy. No spinal canal stenosis. Mild right and moderate left neural foraminal narrowing. IMPRESSION: 1. Diffuse sclerotic lesions throughout the imaged spine and pelvis, concerning for diffuse osseous metastatic disease. 2. No acute vertebral body fracture. 3. L3-L4 moderate spinal canal stenosis and severe right neural foraminal narrowing. 4. L4-L5 moderate to severe right neural foraminal narrowing. 5. L5-S1 moderate left and mild right neural foraminal narrowing. Electronically Signed   By: Merilyn Baba M.D.   On: 09/28/2021 17:52   CT BIOPSY  Result Date: 10/01/2021 INDICATION:  71 year old gentleman with enlarged retroperitoneal lymph nodes presents to IR for CT-guided biopsy. EXAM: CT-guided biopsy of enlarged left periaortic lymph node MEDICATIONS: None. ANESTHESIA/SEDATION: Moderate (conscious) sedation was employed during this procedure. A total of Versed 2 mg and Fentanyl 100 mcg was administered intravenously. Moderate Sedation Time: 18 minutes. The patient's level of consciousness and vital signs were monitored continuously by radiology nursing throughout the procedure under my direct supervision. COMPLICATIONS: None immediate.  PROCEDURE: Informed written consent was obtained from the patient after a thorough discussion of the procedural risks, benefits and alternatives. All questions were addressed. Maximal Sterile Barrier Technique was utilized including caps, mask, sterile gowns, sterile gloves, sterile drape, hand hygiene and skin antiseptic. A timeout was performed prior to the initiation of the procedure. Patient position prone on the CT table. Left flank skin prepped and draped in usual sterile fashion. Following local lidocaine administration, 17 gauge introducer needle was advanced into the enlarged left retroperitoneal lymph node utilizing CT guidance. Four cores were obtained from the enlarged left retroperitoneal lymph node and sent to pathology in sterile saline. Needle removed and hemostasis achieved with manual compression. IMPRESSION: CT-guided biopsy enlarged left retroperitoneal lymph node. Electronically Signed   By: Miachel Roux M.D.   On: 10/01/2021 14:51   IR NEPHROSTOMY PLACEMENT RIGHT  Result Date: 10/01/2021 INDICATION: 71 year old gentleman with the right hydronephrosis presents to IR for percutaneous nephrostomy drain placement. EXAM: Right percutaneous nephrostomy drain COMPARISON:  None. MEDICATIONS: Rocephin 2 g IV; The antibiotic was administered in an appropriate time frame prior to skin puncture. ANESTHESIA/SEDATION: Fentanyl 2 mcg IV; Versed 50  mg IV Moderate Sedation Time:  12 minutes The patient was continuously monitored during the procedure by the interventional radiology nurse under my direct supervision. CONTRAST:  5 mL of Omnipaque 300-administered into the collecting system(s) FLUOROSCOPY TIME:  Fluoroscopy Time: 1 minutes 6 seconds (8 mGy). COMPLICATIONS: None immediate. PROCEDURE: Informed written consent was obtained from the patient after a thorough discussion of the procedural risks, benefits and alternatives. All questions were addressed. Maximal Sterile Barrier Technique was utilized including caps, mask, sterile gowns, sterile gloves, sterile drape, hand hygiene and skin antiseptic. A timeout was performed prior to the initiation of the procedure. Patient position prone on the procedure table. Right flank prepped and draped usual fashion. Utilizing continuous ultrasound guidance, a 21 gauge needle was utilized to access a mid pole calyx urine return was noted from the needle hub. The 21 gauge needle was exchanged for a transitional dilator set over 0.018 inch guidewire. Transitional dilator set exchanged for 10 French multipurpose pigtail drain over 0.035 inch guidewire. Sample of urine was aspirated and sent for Gram stain and culture. Contrast administered through the drain confirmed appropriate positioning of the nephrostomy drain. The drain was secured to skin with suture and connected to bag. IMPRESSION: Successful placement of 10 French right nephrostomy drain. PLAN: When patient condition allows, patient can be brought back for ureteral stent placement. Electronically Signed   By: Miachel Roux M.D.   On: 10/01/2021 14:57    Microbiology: Recent Results (from the past 240 hour(s))  Resp Panel by RT-PCR (Flu A&B, Covid) Nasopharyngeal Swab     Status: None   Collection Time: 09/28/21  9:48 PM   Specimen: Nasopharyngeal Swab; Nasopharyngeal(NP) swabs in vial transport medium  Result Value Ref Range Status   SARS Coronavirus 2 by  RT PCR NEGATIVE NEGATIVE Final    Comment: (NOTE) SARS-CoV-2 target nucleic acids are NOT DETECTED.  The SARS-CoV-2 RNA is generally detectable in upper respiratory specimens during the acute phase of infection. The lowest concentration of SARS-CoV-2 viral copies this assay can detect is 138 copies/mL. A negative result does not preclude SARS-Cov-2 infection and should not be used as the sole basis for treatment or other patient management decisions. A negative result may occur with  improper specimen collection/handling, submission of specimen other than nasopharyngeal swab, presence of viral mutation(s) within the areas targeted  by this assay, and inadequate number of viral copies(<138 copies/mL). A negative result must be combined with clinical observations, patient history, and epidemiological information. The expected result is Negative.  Fact Sheet for Patients:  EntrepreneurPulse.com.au  Fact Sheet for Healthcare Providers:  IncredibleEmployment.be  This test is no t yet approved or cleared by the [place] FDA and  has been authorized for detection and/or diagnosis of SARS-CoV-2 by FDA under an Emergency Use Authorization (EUA). This EUA will remain  in effect (meaning this test can be used) for the duration of the COVID-19 declaration under Section 564(b)(1) of the Act, 21 U.S.C.section 360bbb-3(b)(1), unless the authorization is terminated  or revoked sooner.       Influenza A by PCR NEGATIVE NEGATIVE Final   Influenza B by PCR NEGATIVE NEGATIVE Final    Comment: (NOTE) The Xpert Xpress SARS-CoV-2/FLU/RSV plus assay is intended as an aid in the diagnosis of influenza from Nasopharyngeal swab specimens and should not be used as a sole basis for treatment. Nasal washings and aspirates are unacceptable for Xpert Xpress SARS-CoV-2/FLU/RSV testing.  Fact Sheet for Patients: EntrepreneurPulse.com.au  Fact Sheet  for Healthcare Providers: IncredibleEmployment.be  This test is not yet approved or cleared by the [place] FDA and has been authorized for detection and/or diagnosis of SARS-CoV-2 by FDA under an Emergency Use Authorization (EUA). This EUA will remain in effect (meaning this test can be used) for the duration of the COVID-19 declaration under Section 564(b)(1) of the Act, 21 U.S.C. section 360bbb-3(b)(1), unless the authorization is terminated or revoked.  Performed at [facility], [place] [address], [place], [place] [address]   Aerobic/Anaerobic Culture w Gram Stain (surgical/deep wound)     Status: None (Preliminary result)   Collection Time: 10/01/21  2:30 PM   Specimen: Kidney  Result Value Ref Range Status   Specimen Description   Final    KIDNEY RIGHT Performed at [place] [address] [place], [place] [address]    Special Requests   Final    NONE Performed at [facility], [place] [address] [place], [place] [address]    Gram Stain   Final    RARE WBC PRESENT, PREDOMINANTLY MONONUCLEAR NO ORGANISMS SEEN Performed at [facility], [place] [address], [place], [place] [address]    Culture PENDING  Incomplete   Report Status PENDING  Incomplete     Labs: Basic Metabolic Panel: Recent Labs  Lab 09/28/21 1607 09/29/21 0602 09/30/21 0553 10/01/21 0539  NA 136 135 136 139  K 4.1 4.2 3.9 3.7  CL 104 100 102 103  CO2 22 24 24 25   GLUCOSE 113* 133* 106* 125*  BUN 18 16 19 18   CREATININE 1.24 1.15 1.22 1.14  CALCIUM 8.2* 8.2* 7.7* 8.0*   Liver Function Tests: Recent Labs  Lab 09/28/21 1607 09/29/21 0602 09/30/21 0553 10/01/21 0539  AST 29 22 22 22   ALT 20 17 15 16   ALKPHOS 358* 283* 244* 259*  BILITOT 1.8* 1.7* 1.7* 1.4*  PROT 7.2 6.2* 6.1* 6.2*  ALBUMIN 2.9* 2.5* 2.4* 2.4*   Recent Labs  Lab 09/28/21 1607  LIPASE 26   No results for input(s): AMMONIA in the last  168 hours. CBC: Recent Labs  Lab 09/28/21 1607 09/29/21 0602 09/30/21 0553 10/01/21 0539 10/02/21 0922  WBC 5.3 4.6 5.3 4.5 3.5*  NEUTROABS 3.4  --  3.3 2.7 2.2  HGB 7.7* 6.3* 7.5* 8.2* 7.2*  HCT 23.9* 19.9* 23.1*  25.4* 22.7*  MCV 91.9 92.1 90.6 91.4 92.3  PLT 154 140* 136* 156 141*   Cardiac Enzymes: No results for input(s): CKTOTAL, CKMB, CKMBINDEX, TROPONINI in the last 168 hours. BNP: BNP (last 3 results) Recent Labs    09/28/21 1613  BNP 332.3*    ProBNP (last 3 results) No results for input(s): PROBNP in the last 8760 hours.  CBG: No results for input(s): GLUCAP in the last 168 hours.     Signed:  Nita Sells MD   [facility] 10/02/2021, 9:44 AM

## 2021-10-03 ENCOUNTER — Telehealth: Payer: Self-pay | Admitting: Radiation Oncology

## 2021-10-03 DIAGNOSIS — C61 Malignant neoplasm of prostate: Secondary | ICD-10-CM | POA: Diagnosis present

## 2021-10-03 DIAGNOSIS — C7951 Secondary malignant neoplasm of bone: Secondary | ICD-10-CM | POA: Diagnosis present

## 2021-10-03 LAB — SURGICAL PATHOLOGY

## 2021-10-03 NOTE — Telephone Encounter (Signed)
I called and spoke with the patient and let him know our plan was to treat L3, but during simulation there was not a distinct site in his t spine or adjacent ribs that appear to be the source of his left chest wall or shoulder pain. If his symptoms become more localized we could revisit this as well. I did confirm with pathology that this is prostate cancer and share that with him as well. Path is going to sign out his report this am.

## 2021-10-04 ENCOUNTER — Other Ambulatory Visit: Payer: Self-pay

## 2021-10-04 ENCOUNTER — Ambulatory Visit
Admission: RE | Admit: 2021-10-04 | Discharge: 2021-10-04 | Disposition: A | Discharge status: Home / Self Care | Payer: Medicare Other | Source: Ambulatory Visit | Attending: Radiation Oncology | Admitting: Radiation Oncology

## 2021-10-04 DIAGNOSIS — C7951 Secondary malignant neoplasm of bone: Secondary | ICD-10-CM | POA: Diagnosis not present

## 2021-10-05 ENCOUNTER — Ambulatory Visit
Admission: RE | Admit: 2021-10-05 | Discharge: 2021-10-05 | Disposition: A | Discharge status: Home / Self Care | Payer: Medicare Other | Source: Ambulatory Visit | Attending: Radiation Oncology | Admitting: Radiation Oncology

## 2021-10-05 DIAGNOSIS — C7951 Secondary malignant neoplasm of bone: Secondary | ICD-10-CM | POA: Diagnosis not present

## 2021-10-05 NOTE — Progress Notes (Addendum)
Patient brought around to nursing prior to radiation treatment with complaints of severe pain/discomfort related to constipation. Patient states he cannot remember when he last had a BM. Reports he was discharged from the hospital this past Tuesday 10/02/21, and his constipation has gotten progressively worse since then; reports difficulty ambulating and sleeping. Patient assessed by Dr. Lisbeth Renshaw and received orders to perform soaps enema. With NT assistance, patient arranged into side-lying position, and enema successfully administered. Patient eventually able to pass several golf-ball sized pieces of stool. Patient reported he still felt like he had more stool to pass, but his discomfort had resolved and he wanted to receive his radiation treatment. Offered to escort patient over to L1 via wheelchair, but patient declined and was insistent he wanted to walk. Patient assisted over to L1 with stand-by assist and left with radiation therapists without incident. Advised patient to pick up Miralax and prune juice on his way home today, and take both up to twice a day until remainder of obstructed stool had passed. Patient verbalized understanding and agreement. Updated later by same RTs that patient completed his treatment and ambulated out of the clinic without issue.

## 2021-10-07 LAB — AEROBIC/ANAEROBIC CULTURE W GRAM STAIN (SURGICAL/DEEP WOUND): Culture: NO GROWTH

## 2021-10-08 ENCOUNTER — Other Ambulatory Visit: Payer: Self-pay

## 2021-10-08 ENCOUNTER — Ambulatory Visit
Admission: RE | Admit: 2021-10-08 | Discharge: 2021-10-08 | Disposition: A | Discharge status: Home / Self Care | Payer: Medicare Other | Source: Ambulatory Visit | Attending: Radiation Oncology | Admitting: Radiation Oncology

## 2021-10-08 DIAGNOSIS — C7951 Secondary malignant neoplasm of bone: Secondary | ICD-10-CM | POA: Diagnosis not present

## 2021-10-09 ENCOUNTER — Ambulatory Visit
Admission: RE | Admit: 2021-10-09 | Discharge: 2021-10-09 | Disposition: A | Discharge status: Home / Self Care | Payer: Medicare Other | Source: Ambulatory Visit | Attending: Radiation Oncology | Admitting: Radiation Oncology

## 2021-10-09 DIAGNOSIS — C7951 Secondary malignant neoplasm of bone: Secondary | ICD-10-CM | POA: Diagnosis not present

## 2021-10-10 ENCOUNTER — Ambulatory Visit
Admission: RE | Admit: 2021-10-10 | Discharge: 2021-10-10 | Disposition: A | Discharge status: Home / Self Care | Payer: Medicare Other | Source: Ambulatory Visit | Attending: Radiation Oncology | Admitting: Radiation Oncology

## 2021-10-10 ENCOUNTER — Other Ambulatory Visit: Payer: Self-pay

## 2021-10-10 DIAGNOSIS — C7951 Secondary malignant neoplasm of bone: Secondary | ICD-10-CM | POA: Diagnosis not present

## 2021-10-11 ENCOUNTER — Ambulatory Visit
Admission: RE | Admit: 2021-10-11 | Discharge: 2021-10-11 | Disposition: A | Discharge status: Home / Self Care | Payer: Medicare Other | Source: Ambulatory Visit | Attending: Radiation Oncology | Admitting: Radiation Oncology

## 2021-10-11 DIAGNOSIS — C7951 Secondary malignant neoplasm of bone: Secondary | ICD-10-CM | POA: Diagnosis not present

## 2021-10-12 ENCOUNTER — Telehealth: Payer: Self-pay | Admitting: Oncology

## 2021-10-12 ENCOUNTER — Ambulatory Visit
Admission: RE | Admit: 2021-10-12 | Discharge: 2021-10-12 | Disposition: A | Discharge status: Home / Self Care | Payer: Medicare Other | Source: Ambulatory Visit | Attending: Radiation Oncology | Admitting: Radiation Oncology

## 2021-10-12 ENCOUNTER — Other Ambulatory Visit: Payer: Self-pay

## 2021-10-12 DIAGNOSIS — C7951 Secondary malignant neoplasm of bone: Secondary | ICD-10-CM | POA: Diagnosis not present

## 2021-10-12 NOTE — Telephone Encounter (Signed)
Scheduled appointment per 1/20 scheduling message. Patient is aware of upcoming appointment.

## 2021-10-15 ENCOUNTER — Ambulatory Visit
Admission: RE | Admit: 2021-10-15 | Discharge: 2021-10-15 | Disposition: A | Discharge status: Home / Self Care | Payer: Medicare Other | Source: Ambulatory Visit | Attending: Radiation Oncology | Admitting: Radiation Oncology

## 2021-10-15 ENCOUNTER — Other Ambulatory Visit: Payer: Self-pay

## 2021-10-15 DIAGNOSIS — C7951 Secondary malignant neoplasm of bone: Secondary | ICD-10-CM | POA: Diagnosis not present

## 2021-10-16 ENCOUNTER — Encounter: Payer: Self-pay | Admitting: Nutrition

## 2021-10-16 ENCOUNTER — Ambulatory Visit
Admission: RE | Admit: 2021-10-16 | Discharge: 2021-10-16 | Disposition: A | Discharge status: Home / Self Care | Payer: Medicare Other | Source: Ambulatory Visit | Attending: Radiation Oncology | Admitting: Radiation Oncology

## 2021-10-16 ENCOUNTER — Inpatient Hospital Stay: Payer: Medicare Other | Attending: Radiation Oncology | Admitting: Nutrition

## 2021-10-16 DIAGNOSIS — N1330 Unspecified hydronephrosis: Secondary | ICD-10-CM | POA: Insufficient documentation

## 2021-10-16 DIAGNOSIS — Z7982 Long term (current) use of aspirin: Secondary | ICD-10-CM | POA: Insufficient documentation

## 2021-10-16 DIAGNOSIS — D630 Anemia in neoplastic disease: Secondary | ICD-10-CM | POA: Insufficient documentation

## 2021-10-16 DIAGNOSIS — C61 Malignant neoplasm of prostate: Secondary | ICD-10-CM | POA: Insufficient documentation

## 2021-10-16 DIAGNOSIS — Z923 Personal history of irradiation: Secondary | ICD-10-CM | POA: Insufficient documentation

## 2021-10-16 DIAGNOSIS — C7951 Secondary malignant neoplasm of bone: Secondary | ICD-10-CM | POA: Insufficient documentation

## 2021-10-16 DIAGNOSIS — Z79899 Other long term (current) drug therapy: Secondary | ICD-10-CM | POA: Insufficient documentation

## 2021-10-16 DIAGNOSIS — E876 Hypokalemia: Secondary | ICD-10-CM | POA: Insufficient documentation

## 2021-10-16 NOTE — Progress Notes (Signed)
Patient was scheduled for nutrition appointment secondary to weight loss. He did not attend his appointment. Finishes radiation treatments tomorrow. Will not reschedule but feel free to consult if nutrition issues continue.

## 2021-10-16 NOTE — Progress Notes (Signed)
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 He developed fatigue as well as ongoing constipation from narcotics.  Plan: The patient will receive a call in about one month from the radiation oncology department. He will continue follow up with Dr. [name] as well.   ________________________________________________    Carola Rhine, [facility]

## 2021-10-17 ENCOUNTER — Ambulatory Visit
Admission: RE | Admit: 2021-10-17 | Discharge: 2021-10-17 | Disposition: A | Discharge status: Home / Self Care | Payer: Medicare Other | Source: Ambulatory Visit | Attending: Radiation Oncology | Admitting: Radiation Oncology

## 2021-10-17 ENCOUNTER — Other Ambulatory Visit: Payer: Self-pay

## 2021-10-17 ENCOUNTER — Encounter: Payer: Self-pay | Admitting: Radiation Oncology

## 2021-10-17 DIAGNOSIS — C7951 Secondary malignant neoplasm of bone: Secondary | ICD-10-CM | POA: Diagnosis not present

## 2021-10-18 ENCOUNTER — Other Ambulatory Visit: Payer: Self-pay | Admitting: Radiation Oncology

## 2021-10-22 ENCOUNTER — Telehealth: Payer: Self-pay

## 2021-10-22 ENCOUNTER — Other Ambulatory Visit (HOSPITAL_COMMUNITY): Payer: Self-pay

## 2021-10-22 ENCOUNTER — Inpatient Hospital Stay (HOSPITAL_BASED_OUTPATIENT_CLINIC_OR_DEPARTMENT_OTHER): Payer: Medicare Other | Admitting: Oncology

## 2021-10-22 ENCOUNTER — Other Ambulatory Visit: Payer: Self-pay

## 2021-10-22 ENCOUNTER — Encounter: Payer: Self-pay | Admitting: Oncology

## 2021-10-22 VITALS — BP 102/65 | HR 60 | Temp 98.0°F | Resp 17 | Ht 74.0 in | Wt 201.0 lb

## 2021-10-22 DIAGNOSIS — Z923 Personal history of irradiation: Secondary | ICD-10-CM | POA: Diagnosis not present

## 2021-10-22 DIAGNOSIS — C7951 Secondary malignant neoplasm of bone: Secondary | ICD-10-CM | POA: Diagnosis present

## 2021-10-22 DIAGNOSIS — D630 Anemia in neoplastic disease: Secondary | ICD-10-CM | POA: Diagnosis not present

## 2021-10-22 DIAGNOSIS — C61 Malignant neoplasm of prostate: Secondary | ICD-10-CM | POA: Diagnosis present

## 2021-10-22 DIAGNOSIS — D649 Anemia, unspecified: Secondary | ICD-10-CM | POA: Diagnosis not present

## 2021-10-22 DIAGNOSIS — Z79899 Other long term (current) drug therapy: Secondary | ICD-10-CM | POA: Diagnosis not present

## 2021-10-22 DIAGNOSIS — E876 Hypokalemia: Secondary | ICD-10-CM | POA: Insufficient documentation

## 2021-10-22 DIAGNOSIS — N1330 Unspecified hydronephrosis: Secondary | ICD-10-CM | POA: Diagnosis not present

## 2021-10-22 DIAGNOSIS — Z7982 Long term (current) use of aspirin: Secondary | ICD-10-CM | POA: Diagnosis not present

## 2021-10-22 MED ORDER — ABIRATERONE ACETATE 250 MG PO TABS
1000.0000 mg | ORAL_TABLET | Freq: Every day | ORAL | 0 refills | Status: DC
  Filled 2021-10-22: qty 120, 30d supply, fill #0

## 2021-10-22 MED ORDER — ABIRATERONE ACETATE 250 MG PO TABS: 1000.0000 mg | ORAL_TABLET | Freq: Every day | ORAL | 0 refills | Status: DC

## 2021-10-22 MED ORDER — PREDNISONE 5 MG PO TABS: 5.0000 mg | ORAL_TABLET | Freq: Every day | ORAL | 3 refills | Status: DC

## 2021-10-22 NOTE — Telephone Encounter (Signed)
Oral Oncology Patient Advocate Encounter   Received notification from [employer] that prior authorization for Erik Page is required.   PA submitted on CoverMyMeds Key [id number] Status is pending   Oral Oncology Clinic will continue to follow.  Erik Page Patient [place] Phone [phone] Fax [phone] 10/22/2021 1:32 PM

## 2021-10-22 NOTE — Telephone Encounter (Signed)
Oral Oncology Patient Advocate Encounter  Prior Authorization for Erik Page has been approved.    PA# [id number] Effective dates: 09/22/21 through 10/21/24  Patient must use [facility] will continue to follow.   [place] Patient [place] Phone [phone] Fax [phone] 10/22/2021 1:34 PM

## 2021-10-22 NOTE — Progress Notes (Signed)
Hematology and Oncology Follow Up Visit  Erik Page [id number] 09/07/1951 71 y.o. 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   Prior Therapy:   He is status post percutaneous nephrostomy tube placement and retroperitoneal lymph node biopsy completed on September 30, 2021.  He is status post Firmagon 240 mg on October 01, 2021.  He is status post radiation therapy to the lumbar spine after receiving 30 Gray in 10 fractions in January 2023.  Current therapy: Under evaluation to continue therapy.  Interim History: [name] returns today for a follow-up visit.  He was discharged on October 02, 2021 completed radiation therapy on October 17, 2021.  Since that time, he reports feeling better with resolution of his bone pain.  He is no longer taking any narcotic analgesia and takes ibuprofen periodically.  He is able to ambulate without any major difficulties.  He denies any bone pain or unsteadiness.  He does report some calf tenderness.  He still has a right nephrostomy tube but able to urinate without any issues.     Medications: I have reviewed the patient's current medications.  Current Outpatient Medications  Medication Sig Dispense Refill   aspirin EC 81 MG EC tablet Take 1 tablet (81 mg total) by mouth daily.     atorvastatin (LIPITOR) 80 MG tablet Take 1 tablet (80 mg total) by mouth daily. TAKE ONE TABLET BY MOUTH DAILY  **MUST CALL MD FOR APPOINTMENT (Patient taking differently: Take 80 mg by mouth daily.) 90 tablet 3   feeding supplement (ENSURE ENLIVE / ENSURE PLUS) LIQD Take 237 mLs by mouth 2 (two) times daily between meals. 237 mL 12   ibuprofen (ADVIL) 600 MG tablet Take 1 tablet (600 mg total) by mouth 3 (three) times daily. 30 tablet 0   metoprolol tartrate (LOPRESSOR) 25 MG tablet Take 1 tablet  (25 mg total) by mouth 2 (two) times daily. (Patient taking differently: Take 25 mg by mouth daily.) 180 tablet 3   nitroGLYCERIN (NITROSTAT) 0.4 MG SL tablet DISSOLVE 1 TAB UNDER TONGUE FOR CHEST PAIN - IF PAIN REMAINS AFTER 5 MIN, CALL 911 AND REPEAT DOSE. MAX 3 TABS IN 15 MINUTES (Patient taking differently: 0.4 mg every 5 (five) minutes as needed for chest pain.) 25 tablet 3   ondansetron (ZOFRAN) 4 MG tablet Take 1 tablet (4 mg total) by mouth every 6 (six) hours as needed for nausea. 20 tablet 0   oxyCODONE (OXY IR/ROXICODONE) 5 MG immediate release tablet Take 1 tablet (5 mg total) by mouth every 4 (four) hours as needed for moderate pain. 30 tablet 0   oxyCODONE-acetaminophen (PERCOCET/ROXICET) 5-325 MG tablet Take 1-2 tablets by mouth every 4 (four) hours as needed for moderate pain. 30 tablet 0   senna-docusate (SENOKOT-S) 8.6-50 MG tablet Take 1 tablet by mouth at bedtime as needed for mild constipation. 30 tablet 0   No current facility-administered medications for this visit.     Allergies: No Known Allergies   Physical Exam: Blood pressure 102/65, pulse 60, temperature 98 F (36.7 C), temperature source Temporal, resp. rate 17, height 6\' 2"  (1.88 m), weight 201 lb (91.2 kg), SpO2 100 %. ECOG: 1 General appearance: alert and cooperative appeared without distress. Head: Normocephalic, without obvious abnormality Oropharynx: No oral thrush or ulcers. Eyes: No scleral icterus.  Pupils are equal and round reactive to light. Lymph nodes:  Cervical, supraclavicular, and axillary nodes normal. Heart:regular rate and rhythm, S1, S2 normal, no murmur, click, rub or gallop Lung:chest clear, no wheezing, rales, normal symmetric air entry Abdomin: soft, non-tender, without masses or organomegaly. Neurological: No motor, sensory deficits.  Intact deep tendon reflexes. Skin: No rashes or lesions.  No ecchymosis or petechiae. Musculoskeletal: No joint deformity or effusion. Psychiatric:  Mood and affect are appropriate.    Lab Results: Lab Results  Component Value Date   WBC 3.5 (L) 10/02/2021   HGB 7.2 (L) 10/02/2021   HCT 22.7 (L) 10/02/2021   MCV 92.3 10/02/2021   PLT 141 (L) 10/02/2021     Chemistry      Component Value Date/Time   NA 141 10/02/2021 0922   K 3.4 (L) 10/02/2021 0922   CL 106 10/02/2021 0922   CO2 26 10/02/2021 0922   BUN 14 10/02/2021 0922   CREATININE 1.02 10/02/2021 0922      Component Value Date/Time   CALCIUM 7.7 (L) 10/02/2021 0922   ALKPHOS 259 (H) 10/01/2021 0539   AST 22 10/01/2021 0539   ALT 16 10/01/2021 0539   BILITOT 1.4 (H) 10/01/2021 0539         Impression and Plan:  71 year old with:  1.  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  He presented with enlarged prostate, lymphadenopathy and bone disease with a PSA that is at least 1460.   Treatment options moving forward were discussed and reiterated at this time.  He is currently on androgen deprivation therapy but requires therapy escalation.  He is options including abiraterone, enzalutamide, Taxotere chemotherapy as well as combination of the above were discussed.  After discussion today, we opted to proceed with abiraterone 1000 mg and prednisone 5 mg.  Complications include nausea, fatigue, hypertension, adrenal sufficiency, hypokalemia and edema.  He is agreeable to proceed.  We will initiate this treatment in the near future.   2.  Anemia: Related to malignancy and possible bleeding.  We will update his hemoglobin and transfuse as needed.   3.  Bone pain: His pain is improved after treatment of hormone therapy as well as radiation.  He is only taken ibuprofen.  4.  Androgen deprivation therapy: He received Mills Koller and will transition to Eligard next week.  Complications including weight gain, hot flashes were reiterated.   5.  Hydronephrosis: Right-sided nephrostomy tube is in place and he will follow-up with Dr.  Louis Meckel regarding this issue.   6.  Prognosis and goals of care and disposition: This was discussed today in detail.  He understands he has an incurable malignancy but treatment is effective but palliative at best.  This prognosis will be determined by his response to initial treatment.  His performance status is adequate and aggressive measures are warranted.  7.  Bone directed therapy: Recommended calcium and vitamin D for the time being.  Delton See can be considered after obtaining dental clearance.  8.  Follow-up: Will be in the next 6 to 8 weeks to follow his progress.   40  minutes were dedicated to this visit. The time was spent on reviewing laboratory data, imaging studies, discussing treatment options, reviewing prognosis and answering questions regarding future plan.    Zola Button, MD 1/30/202310:29 AM

## 2021-10-22 NOTE — Telephone Encounter (Signed)
Oral Oncology Pharmacist Encounter  Received new prescription for abiraterone (Zytiga) for the treatment of advanced, castration-sensitive prostate cancer in conjunction with prednisone, planned duration until disease progression or unacceptable toxicity.  Labs from 10/02/21 (CBC, CMP) assessed, no interventions neeeded.  Current medication list in Epic reviewed, DDIs with Zytiga identified: - metoprolol: concentration may be increased by zytiga (monitor therapy)  Evaluated chart and no patient barriers to medication adherence noted.   Patient agreement for treatment documented in MD note on 10/22/2021.  Prescription has been e-scribed to the [facility] for benefits analysis and approval.  Oral Oncology Clinic will continue to follow for insurance authorization, copayment issues, initial counseling and start date.  Drema Halon, PharmD Hematology/Oncology Clinical Pharmacist [facility] [phone] 10/22/2021 10:52 AM

## 2021-10-26 NOTE — Telephone Encounter (Signed)
Oral Chemotherapy Pharmacist Encounter  I spoke with patient for overview of: Zytiga for the treatment of advanced, castration-sensitive prostate cancer in conjunction with prednisone, planned duration until disease progression or unacceptable toxicity.   Counseled patient on administration, dosing, side effects, monitoring, drug-food interactions, safe handling, storage, and disposal.  Patient will take Zytiga 250mg  tablets, 4 tablets (1000mg ) by mouth once daily on an empty stomach, 1 hour before or 2 hours after a meal.  Patient states he will take his Zytiga in the morning and will wait at least 1 hour before eating.  Patient will take prednisone 5mg  tablet, 1 tablet by mouth one daily with breakfast.  Zytiga start date: 10/27/2021  Adverse effects include but are not limited to: peripheral edema, GI upset, hypertension, hot flashes, fatigue, and arthralgias.    Patient has started taking the prednisone prescription and will continue to take it with the zytiga.   Reviewed with patient importance of keeping a medication schedule and plan for any missed doses. No barriers to medication adherence identified.  Medication reconciliation performed and medication/allergy list updated.  Insurance authorization for Fabio Asa has been obtained. Patient received medication through [place].   Patient informed the pharmacy will reach out 5-7 days prior to needing next fill of Zytiga to coordinate continued medication acquisition to prevent break in therapy.  All questions answered.  Mr. Follette voiced understanding and appreciation.   Medication education handout placed in mail for patient. Patient knows to call the office with questions or concerns. Oral Chemotherapy Clinic phone number provided to patient.   Drema Halon, PharmD Hematology/Oncology Clinical Pharmacist [facility] [phone] 10/26/2021 1:25 PM

## 2021-10-30 NOTE — Telephone Encounter (Signed)
Oral Chemotherapy Pharmacist Encounter  I spoke with patient for overview of: Zytiga for the treatment of advanced, castration-sensitive prostate cancer in conjunction with prednisone, planned duration until disease progression or unacceptable toxicity.   Counseled patient on administration, dosing, side effects, monitoring, drug-food interactions, safe handling, storage, and disposal.  Patient will take Zytiga 250mg  tablets, 4 tablets (1000mg ) by mouth once daily on an empty stomach, 1 hour before or 2 hours after a meal.  Patient states he will take his Zytiga in the morning and will wait at least 1 hour before eating.  Patient will take prednisone 5mg  tablet, 1 tablet by mouth one daily with breakfast.  Zytiga start date: 10/28/2021  Adverse effects include but are not limited to: peripheral edema, GI upset, hypertension, hot flashes, fatigue, and arthralgias.    Prednisone prescription has been sent to [facility] on 10/22/21. Patient has already picked up the prednisone and started taking it on Sunday.   Reviewed with patient importance of keeping a medication schedule and plan for any missed doses. No barriers to medication adherence identified.  Medication reconciliation performed and medication/allergy list updated.  Insurance authorization for Fabio Asa has been obtained. Patient fills medication through [place]  Patient informed the pharmacy will reach out 5-7 days prior to needing next fill of Zytiga to coordinate continued medication acquisition to prevent break in therapy.  All questions answered.  Mr. Fairley voiced understanding and appreciation.   Medication education handout placed in mail for patient. Patient knows to call the office with questions or concerns. Oral Chemotherapy Clinic phone number provided to patient.   Drema Halon, PharmD Hematology/Oncology Clinical Pharmacist [facility] [phone] 10/30/2021 10:34 AM

## 2021-11-01 ENCOUNTER — Inpatient Hospital Stay: Payer: Medicare Other

## 2021-11-01 ENCOUNTER — Inpatient Hospital Stay: Payer: Medicare Other | Attending: Oncology

## 2021-11-01 ENCOUNTER — Other Ambulatory Visit: Payer: Self-pay

## 2021-11-01 VITALS — BP 106/61 | HR 89 | Temp 98.6°F | Resp 17

## 2021-11-01 DIAGNOSIS — C61 Malignant neoplasm of prostate: Secondary | ICD-10-CM

## 2021-11-01 DIAGNOSIS — C7951 Secondary malignant neoplasm of bone: Secondary | ICD-10-CM | POA: Diagnosis present

## 2021-11-01 LAB — CBC WITH DIFFERENTIAL (CANCER CENTER ONLY)
Abs Immature Granulocytes: 0.02 10*3/uL (ref 0.00–0.07)
Basophils Absolute: 0 10*3/uL (ref 0.0–0.1)
Basophils Relative: 1 %
Eosinophils Absolute: 0.1 10*3/uL (ref 0.0–0.5)
Eosinophils Relative: 3 %
HCT: 25.4 % — ABNORMAL LOW (ref 39.0–52.0)
Hemoglobin: 8.2 g/dL — ABNORMAL LOW (ref 13.0–17.0)
Immature Granulocytes: 1 %
Lymphocytes Relative: 23 %
Lymphs Abs: 0.7 10*3/uL (ref 0.7–4.0)
MCH: 30.8 pg (ref 26.0–34.0)
MCHC: 32.3 g/dL (ref 30.0–36.0)
MCV: 95.5 fL (ref 80.0–100.0)
Monocytes Absolute: 0.2 10*3/uL (ref 0.1–1.0)
Monocytes Relative: 7 %
Neutro Abs: 1.9 10*3/uL (ref 1.7–7.7)
Neutrophils Relative %: 65 %
Platelet Count: 155 10*3/uL (ref 150–400)
RBC: 2.66 MIL/uL — ABNORMAL LOW (ref 4.22–5.81)
RDW: 19.5 % — ABNORMAL HIGH (ref 11.5–15.5)
WBC Count: 2.9 10*3/uL — ABNORMAL LOW (ref 4.0–10.5)
nRBC: 0 % (ref 0.0–0.2)

## 2021-11-01 LAB — CMP (CANCER CENTER ONLY)
ALT: 10 U/L (ref 0–44)
AST: 9 U/L — ABNORMAL LOW (ref 15–41)
Albumin: 3.6 g/dL (ref 3.5–5.0)
Alkaline Phosphatase: 678 U/L — ABNORMAL HIGH (ref 38–126)
Anion gap: 4 — ABNORMAL LOW (ref 5–15)
BUN: 15 mg/dL (ref 8–23)
CO2: 29 mmol/L (ref 22–32)
Calcium: 8.2 mg/dL — ABNORMAL LOW (ref 8.9–10.3)
Chloride: 107 mmol/L (ref 98–111)
Creatinine: 0.84 mg/dL (ref 0.61–1.24)
GFR, Estimated: >60 mL/min (ref >60)
Glucose, Bld: 123 mg/dL — ABNORMAL HIGH (ref 70–99)
Potassium: 4.1 mmol/L (ref 3.5–5.1)
Sodium: 140 mmol/L (ref 135–145)
Total Bilirubin: 1.2 mg/dL (ref 0.3–1.2)
Total Protein: 6.2 g/dL — ABNORMAL LOW (ref 6.5–8.1)

## 2021-11-01 MED ORDER — LEUPROLIDE ACETATE (4 MONTH) 30 MG ~~LOC~~ KIT
30.0000 mg | PACK | Freq: Once | SUBCUTANEOUS | Status: AC
  Administered 2021-11-01: 30 mg via SUBCUTANEOUS
  Filled 2021-11-01: qty 30

## 2021-11-02 LAB — PROSTATE-SPECIFIC AG, SERUM (LABCORP): Prostate Specific Ag, Serum: 81.3 ng/mL — ABNORMAL HIGH (ref 0.0–4.0)

## 2021-11-06 ENCOUNTER — Other Ambulatory Visit: Payer: Self-pay | Admitting: Oncology

## 2021-11-06 DIAGNOSIS — C61 Malignant neoplasm of prostate: Secondary | ICD-10-CM

## 2021-11-08 ENCOUNTER — Other Ambulatory Visit (HOSPITAL_COMMUNITY): Payer: Self-pay | Admitting: Physician Assistant

## 2021-11-08 ENCOUNTER — Telehealth: Payer: Self-pay

## 2021-11-08 NOTE — Telephone Encounter (Signed)
Pt called requesting 11/01/21 lab results. PSA reviewed. Patient requesting call back with more detailed information about lab results. Advised that Dr. [name] would be back in the office on 11/12/21.  Message routed to Dr. [name]

## 2021-11-12 ENCOUNTER — Ambulatory Visit (HOSPITAL_COMMUNITY): Payer: Medicare Other

## 2021-11-12 ENCOUNTER — Other Ambulatory Visit: Payer: Self-pay

## 2021-11-12 ENCOUNTER — Other Ambulatory Visit (HOSPITAL_COMMUNITY): Payer: Self-pay | Admitting: Interventional Radiology

## 2021-11-12 ENCOUNTER — Other Ambulatory Visit (HOSPITAL_COMMUNITY): Payer: Self-pay | Admitting: Radiology

## 2021-11-12 ENCOUNTER — Ambulatory Visit
Admission: RE | Admit: 2021-11-12 | Discharge: 2021-11-12 | Disposition: A | Discharge status: Home / Self Care | Payer: Medicare Other | Source: Ambulatory Visit | Attending: Radiation Oncology | Admitting: Radiation Oncology

## 2021-11-12 ENCOUNTER — Encounter (HOSPITAL_COMMUNITY): Payer: Self-pay

## 2021-11-12 ENCOUNTER — Ambulatory Visit (HOSPITAL_COMMUNITY)
Admission: RE | Admit: 2021-11-12 | Discharge: 2021-11-12 | Disposition: A | Discharge status: Home / Self Care | Payer: Medicare Other | Source: Ambulatory Visit | Attending: Radiology | Admitting: Radiology

## 2021-11-12 ENCOUNTER — Other Ambulatory Visit (HOSPITAL_COMMUNITY): Payer: Medicare Other

## 2021-11-12 VITALS — BP 138/74 | HR 75 | Temp 98.4°F | Resp 16

## 2021-11-12 DIAGNOSIS — C61 Malignant neoplasm of prostate: Secondary | ICD-10-CM | POA: Insufficient documentation

## 2021-11-12 DIAGNOSIS — N1330 Unspecified hydronephrosis: Secondary | ICD-10-CM

## 2021-11-12 DIAGNOSIS — R300 Dysuria: Secondary | ICD-10-CM | POA: Diagnosis present

## 2021-11-12 HISTORY — PX: IR CONVERT RIGHT NEPHROSTOMY TO NEPHROURETERAL CATH: IMG6068

## 2021-11-12 IMAGING — XA IR CONVERT NEPHROMSTOMY TO NEPHROURETERAL CATH RIGHT
1 series · 6 of 6 positions shown · non-contrast
Comparison: IR fluoroscopy, [DATE].
COMPARISON: IR fluoroscopy, [DATE].

Addendum:
INDICATION: History of prostate cancer with RIGHT hydronephrosis. Requested
catheter conversion

EXAM:
RIGHT SIDED NEPHROSTOMY TO NEPHROURETERAL CATHETER CONVERSION
TECHNIQUE: Informed written consent was obtained from the the patient and/or
patient's representative after a discussion of the risks, benefits
and alternatives to treatment. Questions regarding the procedure
were encouraged and answered. A timeout was performed prior to the
initiation of the procedure.

[Series 1: ir nephrostomy exchange right · 6 of 6 slices shown]
[im 1/6]
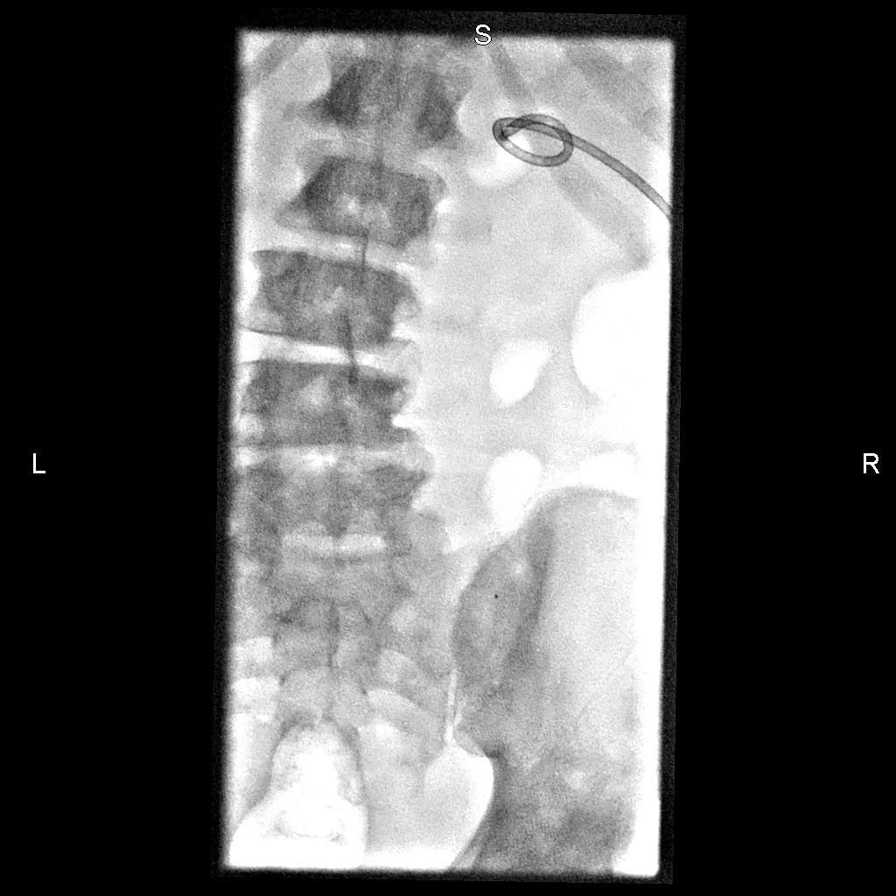
[im 2/6]
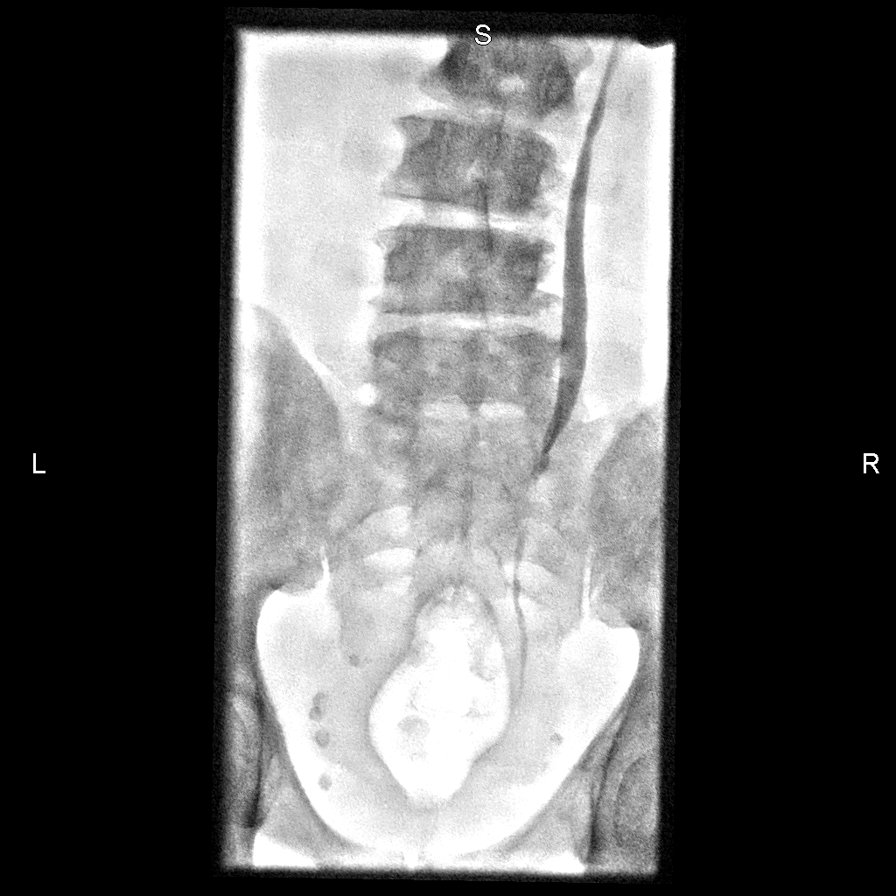
[im 3/6]
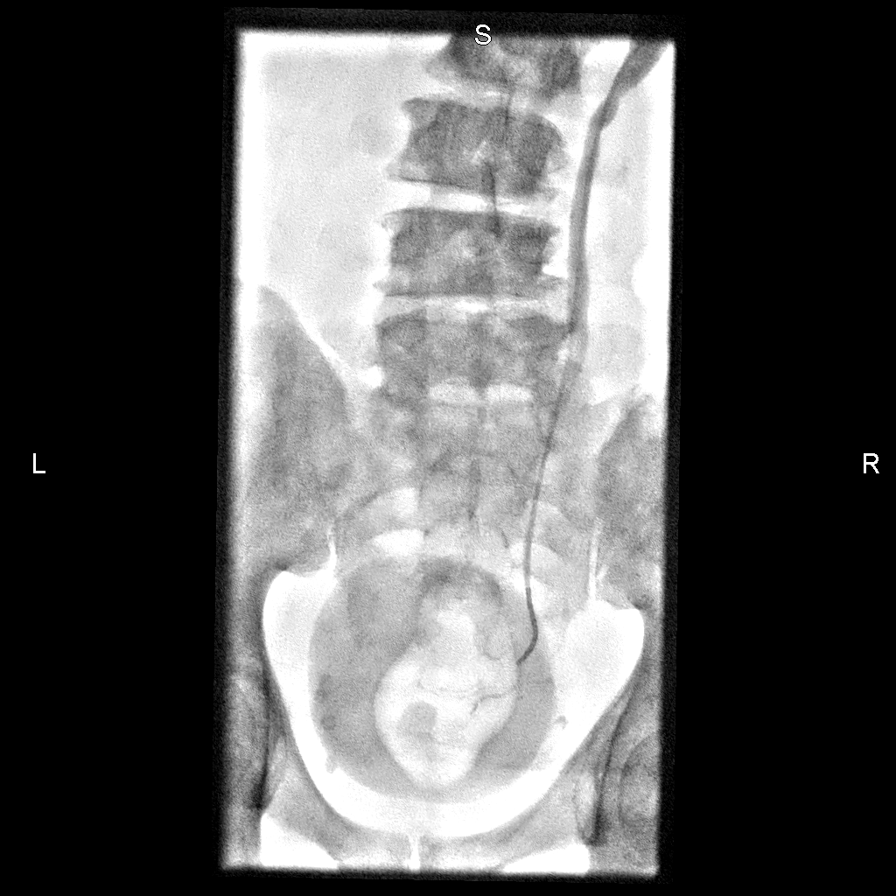
[im 4/6]
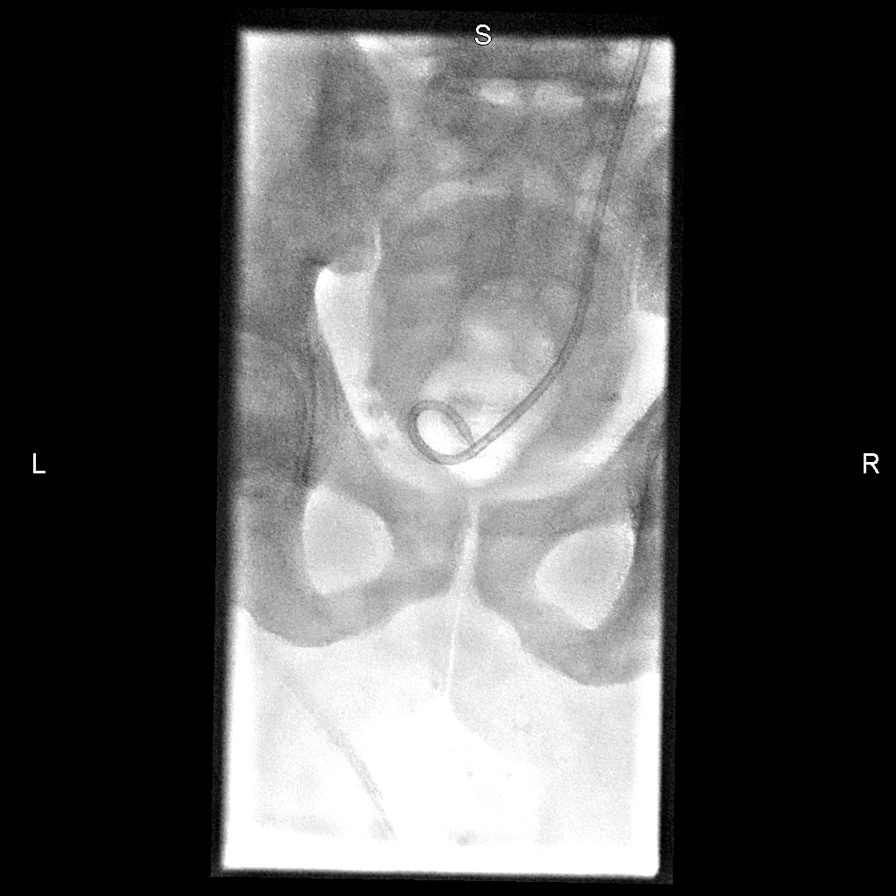
[im 5/6]
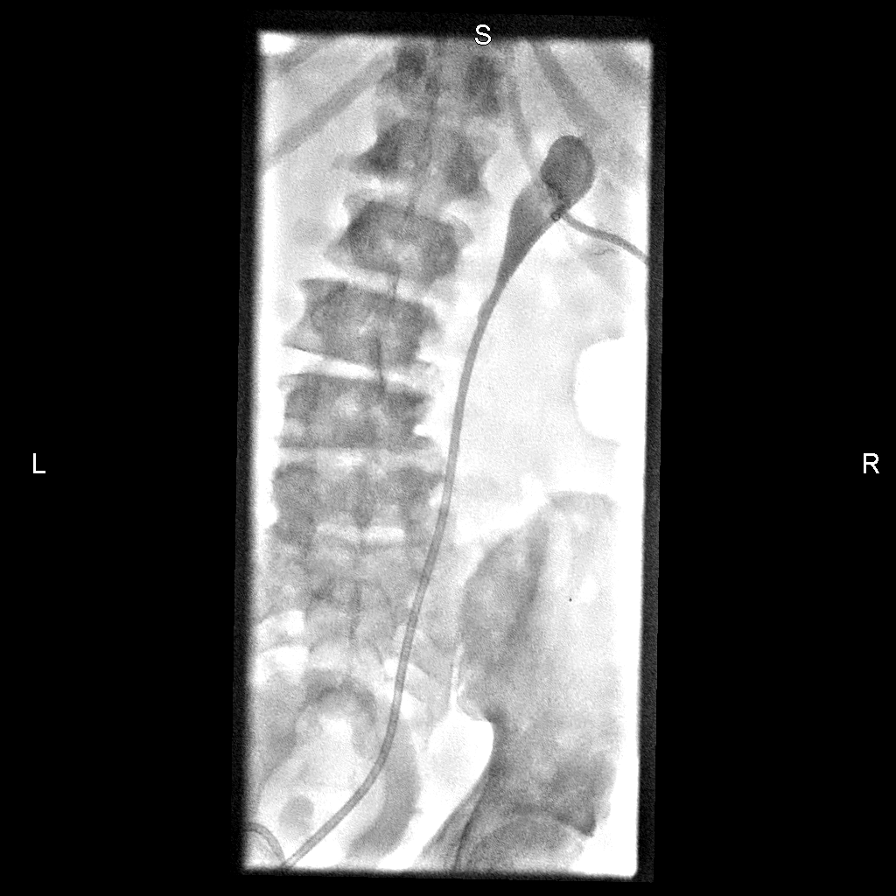
[im 6/6]
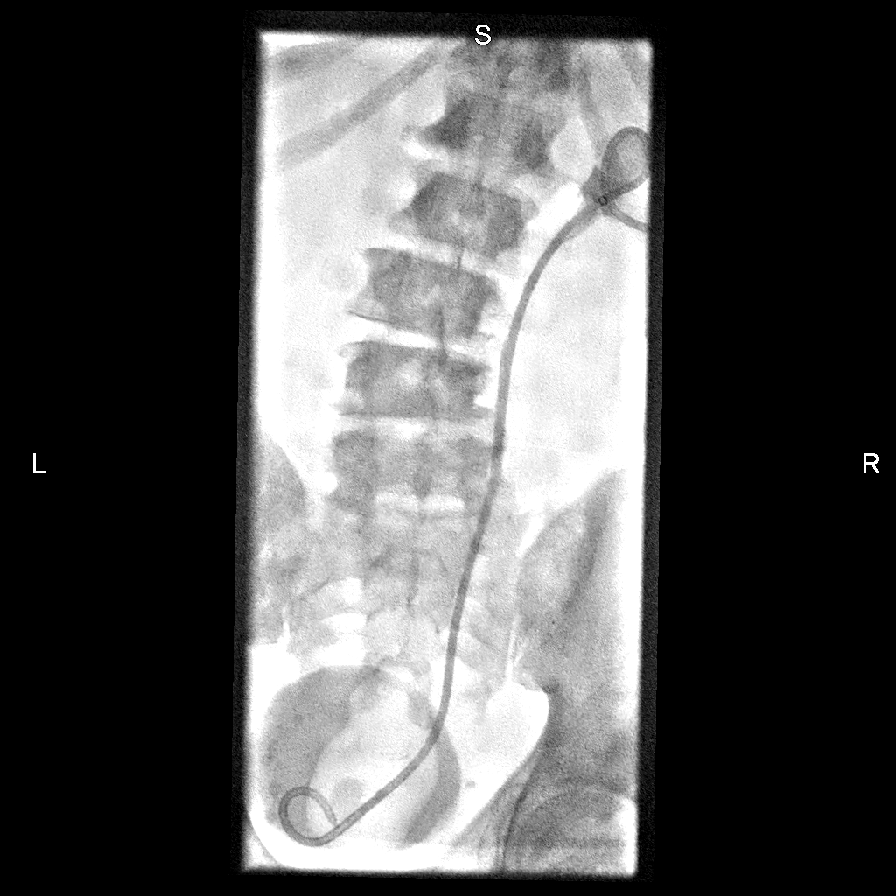

[6 of 6 positions shown; findings below may reference images not displayed]

CT AP, [DATE].

CONTRAST:  20 mL [57] administered into the collecting system

FLUOROSCOPY TIME:  5 minutes 30 seconds (58 mGy )

COMPLICATIONS:
None immediate.
External portion of the existing RIGHT nephrostomy catheter as well
as the surrounding skin was draped in the usual sterile fashion. A
sterile drape was applied covering the operative field. Maximum
barrier sterile technique with sterile gowns and gloves were used
for the procedure. A timeout was performed prior to the initiation
of the procedure.

A pre procedural spot fluoroscopic image was obtained after contrast
was injected via the existing nephrostomy catheter demonstrating
appropriate positioning within the renal pelvis. The existing
catheter was cut and cannulated with a 0.035 inch glidewire which
was advanced over Kumpe catheter into the urinary bladder. The
Glidewire was exchanged for an Amplatz wire. Under intermittent
fluoroscopic guidance, the existing nephrostomy catheter was
exchanged for a new 10 French, 28 cm nephroureteral catheter.
Contrast injection confirmed appropriate positioning within the
renal pelvis and urinary bladder, then a post exchange fluoroscopic
image was obtained. The catheter was secured by 0-Silk suture. The
patient tolerated the procedure well without immediate
postprocedural complication.
FINDINGS: The existing RIGHT nephrostomy catheter is appropriately positioned
and functioning.

After successful fluoroscopic guided conversion to nephroureteral
catheter, the new catheter is coiled with tip located within the
urinary bladder.
IMPRESSION: Successful fluoroscopic guided conversion of a RIGHT sided 10 Fr
nephrostomy catheter to 10 Fr, 25 cm percutaneous nephroureteral
catheter, as above.

ADDENDUM:
Addendum to include sedation time detail.

Moderate (conscious) sedation was employed during this procedure. A
total of Versed 4 mg and Fentanyl 100 mcg was administered
intravenously.

Moderate Sedation Time: 18 minutes. The patient's level of
consciousness and vital signs were monitored continuously by
radiology nursing throughout the procedure under my direct
supervision.

*** End of Addendum ***
CT AP, [DATE].

CONTRAST:  20 mL [57] administered into the collecting system

FLUOROSCOPY TIME:  5 minutes 30 seconds (58 mGy )

COMPLICATIONS:
None immediate.
External portion of the existing RIGHT nephrostomy catheter as well
as the surrounding skin was draped in the usual sterile fashion. A
sterile drape was applied covering the operative field. Maximum
barrier sterile technique with sterile gowns and gloves were used
for the procedure. A timeout was performed prior to the initiation
of the procedure.

A pre procedural spot fluoroscopic image was obtained after contrast
was injected via the existing nephrostomy catheter demonstrating
appropriate positioning within the renal pelvis. The existing
catheter was cut and cannulated with a 0.035 inch glidewire which
was advanced over Kumpe catheter into the urinary bladder. The
Glidewire was exchanged for an Amplatz wire. Under intermittent
fluoroscopic guidance, the existing nephrostomy catheter was
exchanged for a new 10 French, 28 cm nephroureteral catheter.
Contrast injection confirmed appropriate positioning within the
renal pelvis and urinary bladder, then a post exchange fluoroscopic
image was obtained. The catheter was secured by 0-Silk suture. The
patient tolerated the procedure well without immediate
postprocedural complication.
FINDINGS: The existing RIGHT nephrostomy catheter is appropriately positioned
and functioning.

After successful fluoroscopic guided conversion to nephroureteral
catheter, the new catheter is coiled with tip located within the
urinary bladder.
IMPRESSION: Successful fluoroscopic guided conversion of a RIGHT sided 10 Fr
nephrostomy catheter to 10 Fr, 25 cm percutaneous nephroureteral
catheter, as above.

## 2021-11-12 MED ORDER — SODIUM CHLORIDE 0.9% FLUSH: 10.0000 mL | Freq: Every day | INTRAVENOUS | Status: DC

## 2021-11-12 MED ORDER — MIDAZOLAM HCL 2 MG/2ML IJ SOLN
INTRAMUSCULAR | Status: AC
  Filled 2021-11-12: qty 4

## 2021-11-12 MED ORDER — ACETAMINOPHEN 500 MG PO TABS
1000.0000 mg | ORAL_TABLET | ORAL | Status: AC
  Administered 2021-11-12: 1000 mg via ORAL

## 2021-11-12 MED ORDER — SODIUM CHLORIDE 0.9 % IV SOLN: 2.0000 g | Freq: Once | INTRAVENOUS | Status: DC

## 2021-11-12 MED ORDER — LIDOCAINE HCL 1 % IJ SOLN
INTRAMUSCULAR | Status: AC
  Filled 2021-11-12: qty 20

## 2021-11-12 MED ORDER — MIDAZOLAM HCL 2 MG/2ML IJ SOLN
INTRAMUSCULAR | Status: AC | PRN
  Administered 2021-11-12 (×4): 1 mg via INTRAVENOUS

## 2021-11-12 MED ORDER — SODIUM CHLORIDE 0.9 % IV SOLN: INTRAVENOUS | Status: DC

## 2021-11-12 MED ORDER — FENTANYL CITRATE (PF) 100 MCG/2ML IJ SOLN
INTRAMUSCULAR | Status: AC
  Filled 2021-11-12: qty 2

## 2021-11-12 MED ORDER — DIPHENHYDRAMINE HCL 50 MG/ML IJ SOLN
INTRAMUSCULAR | Status: AC
  Filled 2021-11-12: qty 1

## 2021-11-12 MED ORDER — LIDOCAINE HCL (PF) 1 % IJ SOLN
INTRAMUSCULAR | Status: AC | PRN
  Administered 2021-11-12: 10 mL

## 2021-11-12 MED ORDER — FENTANYL CITRATE (PF) 100 MCG/2ML IJ SOLN
INTRAMUSCULAR | Status: AC | PRN
Start: 2021-11-12 — End: 2021-11-12
  Administered 2021-11-12 (×2): 50 ug via INTRAVENOUS

## 2021-11-12 MED ORDER — IOHEXOL 300 MG/ML  SOLN
50.0000 mL | Freq: Once | INTRAMUSCULAR | Status: AC | PRN
  Administered 2021-11-12: 20 mL

## 2021-11-12 MED ORDER — ACETAMINOPHEN 325 MG PO TABS: 650.0000 mg | ORAL_TABLET | ORAL | Status: DC | PRN

## 2021-11-12 MED ORDER — ACETAMINOPHEN 325 MG PO TABS: 650.0000 mg | ORAL_TABLET | ORAL | 0 refills | Status: AC | PRN

## 2021-11-12 MED ORDER — CEFAZOLIN SODIUM-DEXTROSE 2-4 GM/100ML-% IV SOLN
INTRAVENOUS | Status: AC
  Administered 2021-11-12: 2000 mg
  Filled 2021-11-12: qty 100

## 2021-11-12 MED ORDER — ACETAMINOPHEN 500 MG PO TABS
ORAL_TABLET | ORAL | Status: AC
  Filled 2021-11-12: qty 2

## 2021-11-12 NOTE — Procedures (Signed)
Vascular and Interventional Radiology Procedure Note  Patient: Erik Page DOB: March 27, 1951 Medical Record Number: [id number] Note Date/Time: 11/12/21 4:39 PM   Performing Physician: Michaelle Birks, MD Assistant(s): None  Diagnosis: Routine exchange.  Procedure:  RIGHT  NEPHROURETERAL CATHETER PLACEMENT RIGHT  ANTEROGRADE NEPHROSTOGRAM  Anesthesia: Conscious Sedation Complications: None Estimated Blood Loss: Minimal Specimens:  None  Findings:  Successful exchange for a RIGHT-sided, 28cm, 10 F percutaneous internal/external nephroureteral (PCNU) catheter.   Plan:  Pt wishes to have tube capped.  Flush with 10 mL NS qD to keep catheter patent. Follow up for routine nephrostomy tube exchange in 2 month(s).   See detailed procedure note with images in PACS. The patient tolerated the procedure well without incident or complication and was returned to Recovery in stable condition.    Michaelle Birks, MD Vascular and Interventional Radiology Specialists [facility] Radiology   Pager. [place]

## 2021-11-12 NOTE — H&P (Signed)
Chief Complaint: Patient was seen in consultation today for routine right nephrostomy tube exchange with possible ureteral stent placement at the request of Dr. Louis Meckel  Referring Physician(s): Dr. Louis Meckel  Supervising Physician: Michaelle Birks  Patient Status: [facility] - Out-pt  History of Present Illness: Erik Page is a 71 y.o. male w/ PMH of HTN and MI. Pt had PCN placed 09/29/21 for right hydronephrosis. Pt presents today for right PCN exchange with possible ureteral stent placement with moderate sedation.   Past Medical History:  Diagnosis Date   HTN (hypertension)    Myocardial infarction [facility])     Past Surgical History:  Procedure Laterality Date   CARDIAC CATHETERIZATION N/A 08/15/2015   Procedure: Left Heart Cath and Coronary Angiography;  Surgeon: Leonie Man, MD;  Location: [place] CV LAB;  Service: Cardiovascular;  Laterality: N/A;   CARDIAC CATHETERIZATION N/A 08/15/2015   Procedure: Left Heart Cath and Coronary Angiography;  Surgeon: Leonie Man, MD;  Location: [place] CV LAB;  Service: Cardiovascular;  Laterality: N/A;   CARDIAC CATHETERIZATION N/A 08/15/2015   Procedure: Coronary Stent Intervention;  Surgeon: Leonie Man, MD;  Location: [place] [facility];  Service: Cardiovascular;  Laterality: N/A;   IR NEPHROSTOMY PLACEMENT RIGHT  10/01/2021   TONSILLECTOMY      Allergies: Patient has no known allergies.  Medications: Prior to Admission medications   Medication Sig Start Date End Date Taking? Authorizing Provider  abiraterone acetate (ZYTIGA) 250 MG tablet TAKE 4 TABLETS (1000 MG) DAILY. TAKE ON AN EMPTY STOMACH 1 HOUR BEFORE OR 2 HOURS AFTER A MEAL. 11/06/21  Yes [name], MD  aspirin EC 81 MG EC tablet Take 1 tablet (81 mg total) by mouth daily. 08/16/15  Yes Lyda Jester M, PA-C  atorvastatin (LIPITOR) 80 MG tablet Take 1 tablet (80 mg total) by mouth daily. TAKE ONE TABLET BY MOUTH DAILY  **MUST CALL MD FOR  APPOINTMENT Patient taking differently: Take 80 mg by mouth daily. 09/19/21  Yes BranchAlphonse Guild, MD  feeding supplement (ENSURE ENLIVE / ENSURE PLUS) LIQD Take 237 mLs by mouth 2 (two) times daily between meals. 10/02/21  Yes Nita Sells, MD  metoprolol tartrate (LOPRESSOR) 25 MG tablet Take 1 tablet (25 mg total) by mouth 2 (two) times daily. Patient taking differently: Take 25 mg by mouth daily. 09/19/21  Yes Branch, Alphonse Guild, MD  predniSONE (DELTASONE) 5 MG tablet Take 1 tablet (5 mg total) by mouth daily with breakfast. 10/22/21  Yes [name], MD  ibuprofen (ADVIL) 600 MG tablet Take 1 tablet (600 mg total) by mouth 3 (three) times daily. 10/02/21   Nita Sells, MD  nitroGLYCERIN (NITROSTAT) 0.4 MG SL tablet DISSOLVE 1 TAB UNDER TONGUE FOR CHEST PAIN - IF PAIN REMAINS AFTER 5 MIN, CALL 911 AND REPEAT DOSE. MAX 3 TABS IN 15 MINUTES Patient taking differently: 0.4 mg every 5 (five) minutes as needed for chest pain. 09/19/21   Arnoldo Lenis, MD  ondansetron (ZOFRAN) 4 MG tablet Take 1 tablet (4 mg total) by mouth every 6 (six) hours as needed for nausea. 10/02/21   Nita Sells, MD  oxyCODONE (OXY IR/ROXICODONE) 5 MG immediate release tablet Take 1 tablet (5 mg total) by mouth every 4 (four) hours as needed for moderate pain. 10/02/21   Nita Sells, MD  oxyCODONE-acetaminophen (PERCOCET/ROXICET) 5-325 MG tablet Take 1-2 tablets by mouth every 4 (four) hours as needed for moderate pain. 10/02/21   Nita Sells, MD  senna-docusate (SENOKOT-S) 8.6-50 MG tablet Take 1 tablet by mouth at bedtime as needed for mild constipation. 10/02/21   Nita Sells, MD     Family History  Problem Relation Age of Onset   Hypertension Other     Social History   Socioeconomic History   Marital status: Married    Spouse name: Not on file   Number of children: Not on file   Years of education: Not on file   Highest education level: Not on file   Occupational History   Not on file  Tobacco Use   Smoking status: Never   Smokeless tobacco: Never  Vaping Use   Vaping Use: Never used  Substance and Sexual Activity   Alcohol use: No    Alcohol/week: 0.0 standard drinks   Drug use: No   Sexual activity: Not on file  Other Topics Concern   Not on file  Social History Narrative   Works as a Engineer, materials; 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  All other systems are negative.  Review of Systems  Constitutional:  Negative for chills and fever.  HENT:  Negative for nosebleeds.   Eyes:  Negative for visual disturbance.  Respiratory:  Negative for cough and shortness of breath.   Cardiovascular:  Negative for chest pain and leg swelling.  Gastrointestinal:  Negative for abdominal pain, nausea and vomiting.  Neurological:  Negative for light-headedness and headaches.   Vital Signs: BP 111/71    Pulse 78    Temp 98.4 F (36.9 C) (Oral)    Resp 18    SpO2 100%   Physical Exam Constitutional:      Appearance: Normal appearance. He is not ill-appearing.  HENT:     Head: Normocephalic and atraumatic.     Mouth/Throat:     Mouth: Mucous membranes are dry.     Pharynx: Oropharynx is clear.  Eyes:     Extraocular Movements: Extraocular movements intact.     Pupils: Pupils are equal, round, and reactive to light.  Cardiovascular:     Rate and Rhythm: Normal rate and regular rhythm.     Pulses: Normal pulses.     Heart sounds: Normal heart sounds. No murmur heard.   No friction rub. No gallop.  Pulmonary:     Effort: Pulmonary effort is normal. No respiratory distress.     Breath sounds: Normal breath sounds. No  stridor. No wheezing, rhonchi or rales.  Abdominal:     General: Bowel sounds are normal. There is no distension.     Tenderness: There is no abdominal tenderness. There is no guarding.  Musculoskeletal:     Right lower leg: No edema.     Left lower leg: No edema.  Skin:    General: Skin is warm and dry.  Neurological:     Mental Status: He is alert and oriented to person, place, and time.  Psychiatric:        Mood and Affect: Mood normal.        Behavior: Behavior normal.        Thought Content: Thought content normal.  Judgment: Judgment normal.    Imaging: No results found.  Labs:  CBC: Recent Labs    09/30/21 0553 10/01/21 0539 10/02/21 0922 11/01/21 1057  WBC 5.3 4.5 3.5* 2.9*  HGB 7.5* 8.2* 7.2* 8.2*  HCT 23.1* 25.4* 22.7* 25.4*  PLT 136* 156 141* 155    COAGS: Recent Labs    10/01/21 0539  INR 1.3*    BMP: Recent Labs    09/30/21 0553 10/01/21 0539 10/02/21 0922 11/01/21 1057  NA 136 139 141 140  K 3.9 3.7 3.4* 4.1  CL 102 103 106 107  CO2 24 25 26 29   GLUCOSE 106* 125* 106* 123*  BUN 19 18 14 15   CALCIUM 7.7* 8.0* 7.7* 8.2*  CREATININE 1.22 1.14 1.02 0.84  GFRNONAA >60 >60 >60 >60    LIVER FUNCTION TESTS: Recent Labs    09/29/21 0602 09/30/21 0553 10/01/21 0539 11/01/21 1057  BILITOT 1.7* 1.7* 1.4* 1.2  AST 22 22 22  9*  ALT 17 15 16 10   ALKPHOS 283* 244* 259* 678*  PROT 6.2* 6.1* 6.2* 6.2*  ALBUMIN 2.5* 2.4* 2.4* 3.6    TUMOR MARKERS: No results for input(s): AFPTM, CEA, CA199, CHROMGRNA in the last 8760 hours.  Assessment and Plan: History of HTN and MI. Pt had PCN placed 09/29/21 for right hydronephrosis. Pt presents today for right PCN exchange with possible ureteral stent placement with moderate sedation.   Pt noted walking in hall with RN. He ambulates back to room w/o assistance. He is A&O, calm and pleasant. He is in no distress.  Pt states he is NPO per order.  Dr. Maryelizabeth Kaufmann at bedside discussing procedure with pt.    Risks and benefits of PCN exchange was discussed with the patient including, but not limited to, infection, bleeding, significant bleeding causing loss or decrease in renal function or damage to adjacent structures.   All of the patient's questions were answered, patient is agreeable to proceed.  Consent signed and in chart.  Thank you for this interesting consult.  I greatly enjoyed meeting Erik Page and look forward to participating in their care.  A copy of this report was sent to the requesting provider on this date.  Electronically Signed: Tyson Alias, NP 11/12/2021, 2:18 PM   I spent a total of 20 minutes in face to face in clinical consultation, greater than 50% of which was counseling/coordinating care for right PCN exchange with possible ureteral stent with moderate sedation.

## 2021-11-12 NOTE — Progress Notes (Signed)
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 He developed fatigue as well as ongoing constipation from narcotics.   Impression/Plan: 1. 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 I was unable to reach the patient but left a voicemail and on the message, I  discussed that we would be happy to continue to follow him as needed, but he will also continue to follow up with Dr. [name] in medical oncology.      Carola Rhine, PAC

## 2021-11-13 ENCOUNTER — Telehealth (HOSPITAL_COMMUNITY): Payer: Self-pay | Admitting: Interventional Radiology

## 2021-11-13 DIAGNOSIS — R300 Dysuria: Secondary | ICD-10-CM

## 2021-11-13 MED ORDER — SULFAMETHOXAZOLE-TRIMETHOPRIM 800-160 MG PO TABS: 1.0000 | ORAL_TABLET | Freq: Two times a day (BID) | ORAL | 0 refills | Status: AC

## 2021-11-13 MED ORDER — SULFAMETHOXAZOLE-TRIMETHOPRIM 800-160 MG PO TABS: 1.0000 | ORAL_TABLET | Freq: Two times a day (BID) | ORAL | 0 refills | Status: DC

## 2021-11-13 MED ORDER — PHENAZOPYRIDINE HCL 200 MG PO TABS: 200.0000 mg | ORAL_TABLET | Freq: Three times a day (TID) | ORAL | 0 refills | Status: DC | PRN

## 2021-11-13 MED ORDER — PHENAZOPYRIDINE HCL 200 MG PO TABS: 200.0000 mg | ORAL_TABLET | Freq: Three times a day (TID) | ORAL | 0 refills | Status: AC | PRN

## 2021-11-13 NOTE — Progress Notes (Signed)
Vascular and Interventional Radiology  Call Phone Note  Patient: Erik Page DOB: 03/28/1951 Medical Record Number: [id number] Note Date/Time: 11/13/21 4:37 PM   Admitting Diagnosis: 1. Dysuria      I identified myself to the patient and conveyed my credentials to [name] emergencies, Pt was advised to call 911 or go to the nearest emergency room.   Assessment   Plan: 71 y.o. year old male reached out to [facility] On Call Physician with concern for dysuria and R flank discomfort s/p R PCN to PCNU conversion on 11/12/21.   Suspected ureteral and bladder irritation with manipulation and ureteral stent placement  - Pt w capped PCNU. Requested Pt to uncap drain until discomfort clears. - Pyridium 200 mg x 2days - Bactrim DS 800-160 mg x 10 days - OTC analgesics alternating Tylenol and Ibuprofen for symptomatic discomfort.  Follow up No follow-ups on file.  As part of this Telephone encounter, no in-person exam was conducted.  The patient was physically located in [place] or a state in which I am permitted to provide care. The encounter was reasonable and appropriate under the circumstances given the patient's presentation at the time.  The patient and/or parent/guardian has been advised of the potential risks and limitations of this mode of treatment (including, but not limited to, the absence of in-person examination) and has agreed to be treated using telemedicine. The patient's/patient's family's questions regarding their request have been answered and/or has also been advised to contact their providers office for worsening conditions, and seek emergency medical treatment and/or call 911 if the patient deems either necessary.   Michaelle Birks, MD Vascular and Interventional Radiology Specialists [facility] Radiology   Pager. [place]

## 2021-11-16 ENCOUNTER — Ambulatory Visit: Payer: Medicare Other | Admitting: Cardiology

## 2021-11-29 ENCOUNTER — Inpatient Hospital Stay: Payer: Medicare Other | Attending: Oncology

## 2021-11-29 ENCOUNTER — Inpatient Hospital Stay: Payer: Medicare Other | Admitting: Oncology

## 2021-11-29 ENCOUNTER — Telehealth: Payer: Self-pay | Admitting: *Deleted

## 2021-11-29 DIAGNOSIS — D630 Anemia in neoplastic disease: Secondary | ICD-10-CM | POA: Insufficient documentation

## 2021-11-29 DIAGNOSIS — C61 Malignant neoplasm of prostate: Secondary | ICD-10-CM | POA: Insufficient documentation

## 2021-11-29 DIAGNOSIS — Z7952 Long term (current) use of systemic steroids: Secondary | ICD-10-CM | POA: Insufficient documentation

## 2021-11-29 DIAGNOSIS — C7951 Secondary malignant neoplasm of bone: Secondary | ICD-10-CM | POA: Insufficient documentation

## 2021-11-29 DIAGNOSIS — N1330 Unspecified hydronephrosis: Secondary | ICD-10-CM | POA: Insufficient documentation

## 2021-11-29 NOTE — Telephone Encounter (Signed)
PC to patient regarding missed appointments today, he states he was unaware.  Informed patient scheduling department will contact him to reschedule.  Scheduling message sent. ?

## 2021-11-29 NOTE — Telephone Encounter (Signed)
PC to patient, informed him Dr. [name] wants to see him Monday, 12/03/21 at 9:00, he should arrive at 8:45 for labwork.  He verbalizes understanding.  Scheduling message sent. ?

## 2021-11-30 ENCOUNTER — Telehealth: Payer: Self-pay | Admitting: Oncology

## 2021-11-30 NOTE — Telephone Encounter (Signed)
Rescheduled 03/09 missed appointment to 03/13, called patient regarding appointment date and time. Left a voicemail. ?

## 2021-12-02 ENCOUNTER — Other Ambulatory Visit: Payer: Self-pay | Admitting: Oncology

## 2021-12-02 DIAGNOSIS — C61 Malignant neoplasm of prostate: Secondary | ICD-10-CM

## 2021-12-03 ENCOUNTER — Encounter: Payer: Self-pay | Admitting: Oncology

## 2021-12-03 ENCOUNTER — Inpatient Hospital Stay: Payer: Medicare Other

## 2021-12-03 ENCOUNTER — Inpatient Hospital Stay (HOSPITAL_BASED_OUTPATIENT_CLINIC_OR_DEPARTMENT_OTHER): Payer: Medicare Other | Admitting: Oncology

## 2021-12-03 ENCOUNTER — Other Ambulatory Visit: Payer: Self-pay

## 2021-12-03 VITALS — BP 112/71 | HR 80 | Temp 97.3°F | Resp 17 | Wt 210.0 lb

## 2021-12-03 DIAGNOSIS — N1330 Unspecified hydronephrosis: Secondary | ICD-10-CM | POA: Diagnosis not present

## 2021-12-03 DIAGNOSIS — C61 Malignant neoplasm of prostate: Secondary | ICD-10-CM

## 2021-12-03 DIAGNOSIS — C7951 Secondary malignant neoplasm of bone: Secondary | ICD-10-CM | POA: Diagnosis not present

## 2021-12-03 DIAGNOSIS — Z7952 Long term (current) use of systemic steroids: Secondary | ICD-10-CM | POA: Diagnosis not present

## 2021-12-03 DIAGNOSIS — D649 Anemia, unspecified: Secondary | ICD-10-CM

## 2021-12-03 DIAGNOSIS — D630 Anemia in neoplastic disease: Secondary | ICD-10-CM | POA: Diagnosis not present

## 2021-12-03 LAB — CMP (CANCER CENTER ONLY)
ALT: 15 U/L (ref 0–44)
AST: 12 U/L — ABNORMAL LOW (ref 15–41)
Albumin: 3.8 g/dL (ref 3.5–5.0)
Alkaline Phosphatase: 559 U/L — ABNORMAL HIGH (ref 38–126)
Anion gap: 4 — ABNORMAL LOW (ref 5–15)
BUN: 11 mg/dL (ref 8–23)
CO2: 29 mmol/L (ref 22–32)
Calcium: 8.4 mg/dL — ABNORMAL LOW (ref 8.9–10.3)
Chloride: 110 mmol/L (ref 98–111)
Creatinine: 0.75 mg/dL (ref 0.61–1.24)
GFR, Estimated: >60 mL/min (ref >60)
Glucose, Bld: 98 mg/dL (ref 70–99)
Potassium: 3.7 mmol/L (ref 3.5–5.1)
Sodium: 143 mmol/L (ref 135–145)
Total Bilirubin: 1 mg/dL (ref 0.3–1.2)
Total Protein: 6 g/dL — ABNORMAL LOW (ref 6.5–8.1)

## 2021-12-03 LAB — CBC WITH DIFFERENTIAL (CANCER CENTER ONLY)
Abs Immature Granulocytes: 0.02 10*3/uL (ref 0.00–0.07)
Basophils Absolute: 0 10*3/uL (ref 0.0–0.1)
Basophils Relative: 0 %
Eosinophils Absolute: 0.1 10*3/uL (ref 0.0–0.5)
Eosinophils Relative: 2 %
HCT: 32.5 % — ABNORMAL LOW (ref 39.0–52.0)
Hemoglobin: 10.5 g/dL — ABNORMAL LOW (ref 13.0–17.0)
Immature Granulocytes: 1 %
Lymphocytes Relative: 22 %
Lymphs Abs: 0.7 10*3/uL (ref 0.7–4.0)
MCH: 32 pg (ref 26.0–34.0)
MCHC: 32.3 g/dL (ref 30.0–36.0)
MCV: 99.1 fL (ref 80.0–100.0)
Monocytes Absolute: 0.2 10*3/uL (ref 0.1–1.0)
Monocytes Relative: 8 %
Neutro Abs: 2.1 10*3/uL (ref 1.7–7.7)
Neutrophils Relative %: 67 %
Platelet Count: 195 10*3/uL (ref 150–400)
RBC: 3.28 MIL/uL — ABNORMAL LOW (ref 4.22–5.81)
RDW: 15.9 % — ABNORMAL HIGH (ref 11.5–15.5)
WBC Count: 3.1 10*3/uL — ABNORMAL LOW (ref 4.0–10.5)
nRBC: 0 % (ref 0.0–0.2)

## 2021-12-03 LAB — IRON AND IRON BINDING CAPACITY (CC-WL,HP ONLY)
Iron: 94 ug/dL (ref 45–182)
Saturation Ratios: 40 % — ABNORMAL HIGH (ref 17.9–39.5)
TIBC: 235 ug/dL — ABNORMAL LOW (ref 250–450)
UIBC: 141 ug/dL (ref 117–376)

## 2021-12-03 LAB — FERRITIN: Ferritin: 545 ng/mL — ABNORMAL HIGH (ref 24–336)

## 2021-12-03 LAB — SAMPLE TO BLOOD BANK

## 2021-12-03 MED ORDER — OYSTER SHELL CALCIUM/D3 500-5 MG-MCG PO TABS: 1.0000 | ORAL_TABLET | Freq: Two times a day (BID) | ORAL | 3 refills | Status: AC

## 2021-12-03 NOTE — Progress Notes (Signed)
Hematology and Oncology Follow Up Visit ? ?Erik Page ?[id number] ?03/07/1951 71 y.o. ?12/03/2021 9:24 AM ?Erik Page, [name], [name], DO  ? ?Principle Diagnosis: 71 year old man with advanced prostate cancer with lymphadenopathy diagnosed in January 2023.  He has castration-sensitive after presenting with PSA of 1460. ? ? ?Prior Therapy:  ? ?He is status post percutaneous nephrostomy tube placement and retroperitoneal lymph node biopsy completed on September 30, 2021. ? ?He is status post Firmagon 240 mg on October 01, 2021. ? ?He is status post radiation therapy to the lumbar spine after receiving 30 Gray in 10 fractions in January 2023. ? ?Current therapy:  ? ?Eligard 30 mg every 4 months started on November 01, 2021. ? ?Zytiga 1000 mg daily with prednisone 5 mg daily started in February 2023. ? ?Interim History: Erik Page returns for repeat evaluation.  Since last visit, he reports significant improvement in his symptoms and in his overall status by 80%.  He has tolerated Zytiga and prednisone without any major complications.  He denies any nausea, vomiting or excessive fatigue.  His pain is manageable at this time and uses ibuprofen periodically but no longer requiring any additional narcotic pain medication.  His performance status is improving slowly. ? ? ? ? ?Medications: Updated on review. ?Current Outpatient Medications  ?Medication Sig Dispense Refill  ? abiraterone acetate (ZYTIGA) 250 MG tablet TAKE 4 TABLETS (1000 MG) DAILY. TAKE ON AN EMPTY STOMACH 1 HOUR BEFORE OR 2 HOURS AFTER A MEAL. 120 tablet 0  ? aspirin EC 81 MG EC tablet Take 1 tablet (81 mg total) by mouth daily.    ? atorvastatin (LIPITOR) 80 MG tablet Take 1 tablet (80 mg total) by mouth daily. TAKE ONE TABLET BY MOUTH DAILY  **MUST CALL MD FOR APPOINTMENT (Patient taking differently: Take 80 mg by mouth daily.) 90 tablet 3  ? feeding supplement (ENSURE ENLIVE / ENSURE PLUS) LIQD Take 237 mLs by mouth 2 (two) times daily between meals. 237  mL 12  ? ibuprofen (ADVIL) 600 MG tablet Take 1 tablet (600 mg total) by mouth 3 (three) times daily. 30 tablet 0  ? metoprolol tartrate (LOPRESSOR) 25 MG tablet Take 1 tablet (25 mg total) by mouth 2 (two) times daily. (Patient taking differently: Take 25 mg by mouth daily.) 180 tablet 3  ? nitroGLYCERIN (NITROSTAT) 0.4 MG SL tablet DISSOLVE 1 TAB UNDER TONGUE FOR CHEST PAIN - IF PAIN REMAINS AFTER 5 MIN, CALL 911 AND REPEAT DOSE. MAX 3 TABS IN 15 MINUTES (Patient taking differently: 0.4 mg every 5 (five) minutes as needed for chest pain.) 25 tablet 3  ? ondansetron (ZOFRAN) 4 MG tablet Take 1 tablet (4 mg total) by mouth every 6 (six) hours as needed for nausea. 20 tablet 0  ? oxyCODONE (OXY IR/ROXICODONE) 5 MG immediate release tablet Take 1 tablet (5 mg total) by mouth every 4 (four) hours as needed for moderate pain. 30 tablet 0  ? oxyCODONE-acetaminophen (PERCOCET/ROXICET) 5-325 MG tablet Take 1-2 tablets by mouth every 4 (four) hours as needed for moderate pain. 30 tablet 0  ? predniSONE (DELTASONE) 5 MG tablet Take 1 tablet (5 mg total) by mouth daily with breakfast. 90 tablet 3  ? senna-docusate (SENOKOT-S) 8.6-50 MG tablet Take 1 tablet by mouth at bedtime as needed for mild constipation. 30 tablet 0  ? ?No current facility-administered medications for this visit.  ? ? ? ?Allergies: No Known Allergies ? ? ?Physical Exam: ?Blood pressure 112/71, pulse 80, temperature (!) 97.3 ?F (36.3 ?C),  temperature source Temporal, resp. rate 17, weight 210 lb (95.3 kg), SpO2 100 %. ?ECOG: 1 ? ? ? ?General appearance: Comfortable appearing without any discomfort ?Head: Normocephalic without any trauma ?Oropharynx: Mucous membranes are moist and pink without any thrush or ulcers. ?Eyes: Pupils are equal and round reactive to light. ?Lymph nodes: No cervical, supraclavicular, inguinal or axillary lymphadenopathy.   ?Heart:regular rate and rhythm.  S1 and S2 without leg edema. ?Lung: Clear without any rhonchi or wheezes.   No dullness to percussion. ?Abdomin: Soft, nontender, nondistended with good bowel sounds.  No hepatosplenomegaly. ?Musculoskeletal: No joint deformity or effusion.  Full range of motion noted. ?Neurological: No deficits noted on motor, sensory and deep tendon reflex exam. ?Skin: No petechial rash or dryness.  Appeared moist.  ? ? ? ?Lab Results: ?Lab Results  ?Component Value Date  ? WBC 3.1 (L) 12/03/2021  ? HGB 10.5 (L) 12/03/2021  ? HCT 32.5 (L) 12/03/2021  ? MCV 99.1 12/03/2021  ? PLT 195 12/03/2021  ? ?  Chemistry   ?   ?Component Value Date/Time  ? NA 140 11/01/2021 1057  ? K 4.1 11/01/2021 1057  ? CL 107 11/01/2021 1057  ? CO2 29 11/01/2021 1057  ? BUN 15 11/01/2021 1057  ? CREATININE 0.84 11/01/2021 1057  ?    ?Component Value Date/Time  ? CALCIUM 8.2 (L) 11/01/2021 1057  ? ALKPHOS 678 (H) 11/01/2021 1057  ? AST 9 (L) 11/01/2021 1057  ? ALT 10 11/01/2021 1057  ? BILITOT 1.2 11/01/2021 1057  ?  ? ? ? Latest Reference Range & Units 11/01/21 10:57  ?Prostate Specific Ag, Serum 0.0 - 4.0 ng/mL 81.3 (H)  ?(H): Data is abnormally high ? ? ?Impression and Plan: ? ?71 year old with: ? ?1.  Advanced prostate cancer with lymphadenopathy diagnosed in January 2023.  He has castration-sensitive disease presenting with PSA of 1460. ?  ?He is currently on Zytiga which he has tolerated well without any major complications.  Risks and benefits of continuing this treatment long-term were discussed.  Complications that include hypertension, edema and adrenal sufficiency were reiterated.  Alternative treatment options including systemic chemotherapy will be deferred at this time. ?  ?2.  Anemia: His hemoglobin is improved and does not require any transfusion.  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 ?  ?3.  Bone pain: Continues to improve with a cancer treatment.  He is not using any narcotic pain medication. ? ?4.  Androgen deprivation therapy: He will continue Eligard indefinitely and next injection will be in 3 months. ?  ?5.   Hydronephrosis: He continues to follow with Dr. Louis Meckel regarding this issue.  Right nephrostomy tube in place. ?  ?6.  Prognosis and goals of care and disposition: Therapy remains palliative although aggressive measures are warranted given his reasonable performance status and response. ? ?7.  Bone directed therapy: We will start calcium and vitamin D supplements and he will obtain dental clearance before the next visit.  The rationale and risks and benefits of using Xgeva were discussed today.  Complications including osteonecrosis of the jaw and hypocalcemia were discussed.  He will consider that start in 6 to 8 weeks. ? ? ?8.  Follow-up: Will be in 6 to 8 weeks for repeat evaluation. ? ? ?40  minutes were spent on this encounter.  The time was dedicated to reviewing laboratory data, disease status update, treatment choices, addressing complications related to cancer, cancer therapy and future plan of care discussions. ? ? ? ?Zola Button, MD ?  3/13/20239:24 AM ? ?

## 2021-12-04 ENCOUNTER — Telehealth: Payer: Self-pay | Admitting: *Deleted

## 2021-12-04 LAB — PROSTATE-SPECIFIC AG, SERUM (LABCORP): Prostate Specific Ag, Serum: 10 ng/mL — ABNORMAL HIGH (ref 0.0–4.0)

## 2021-12-04 NOTE — Telephone Encounter (Signed)
-----   Message from [name], MD sent at 12/04/2021  9:05 AM EDT ----- ?Please let him know his PSA is down ?

## 2021-12-04 NOTE — Telephone Encounter (Signed)
PC to patient, no answer, left VM - informed patient his PSA is 10.0   Instructed patient to call back with any questions/concerns, [phone]. ?

## 2021-12-31 ENCOUNTER — Other Ambulatory Visit: Payer: Self-pay | Admitting: Oncology

## 2021-12-31 DIAGNOSIS — C61 Malignant neoplasm of prostate: Secondary | ICD-10-CM

## 2022-01-01 ENCOUNTER — Telehealth: Payer: Self-pay | Admitting: *Deleted

## 2022-01-01 NOTE — Telephone Encounter (Signed)
Erik Page wanted to let Dr [name] know that he has received dental clearance. Is getting frustrated with the renal tube in his back. Wants to know if Dr [name] has spoken with Dr Louis Meckel about getting a stent placed instead.  ?

## 2022-01-01 NOTE — Telephone Encounter (Signed)
Pr is going to call Dr Louis Meckel again on Monday.  ?

## 2022-01-07 ENCOUNTER — Ambulatory Visit (HOSPITAL_COMMUNITY)
Admission: RE | Admit: 2022-01-07 | Discharge: 2022-01-07 | Disposition: A | Discharge status: Home / Self Care | Payer: Medicare Other | Source: Ambulatory Visit | Attending: Interventional Radiology | Admitting: Interventional Radiology

## 2022-01-07 DIAGNOSIS — Z436 Encounter for attention to other artificial openings of urinary tract: Secondary | ICD-10-CM | POA: Insufficient documentation

## 2022-01-07 DIAGNOSIS — N1330 Unspecified hydronephrosis: Secondary | ICD-10-CM | POA: Diagnosis not present

## 2022-01-07 HISTORY — PX: IR EXT NEPHROURETERAL CATH EXCHANGE: IMG5418

## 2022-01-07 IMAGING — XA IR EXT NEPHROURETERAL CATH EXCHANGE
1 series · 4 of 4 positions shown · non-contrast
Comparison: none

CLINICAL DATA: Hydronephrosis with indwelling nephroureteral
catheter, presents for scheduled routine exchange

EXAM:
ANTEGRADE NEPHROURETERAL CATHETER EXCHANGE UNDER FLUOROSCOPY
FLUOROSCOPY TIME:  Radiation Exposure Index (as provided by the
fluoroscopic device): 18 mGy air Kerma
TECHNIQUE: The procedure, risks (including but not limited to bleeding,
infection, organ damage ), benefits, and alternatives were explained
to the patient. Questions regarding the procedure were encouraged
and answered. The patient understands and consents to the procedure.

[Series 1: processed: ir ext nephroureteral cath ex · 4 of 64 frames shown]
[frame 4/64]
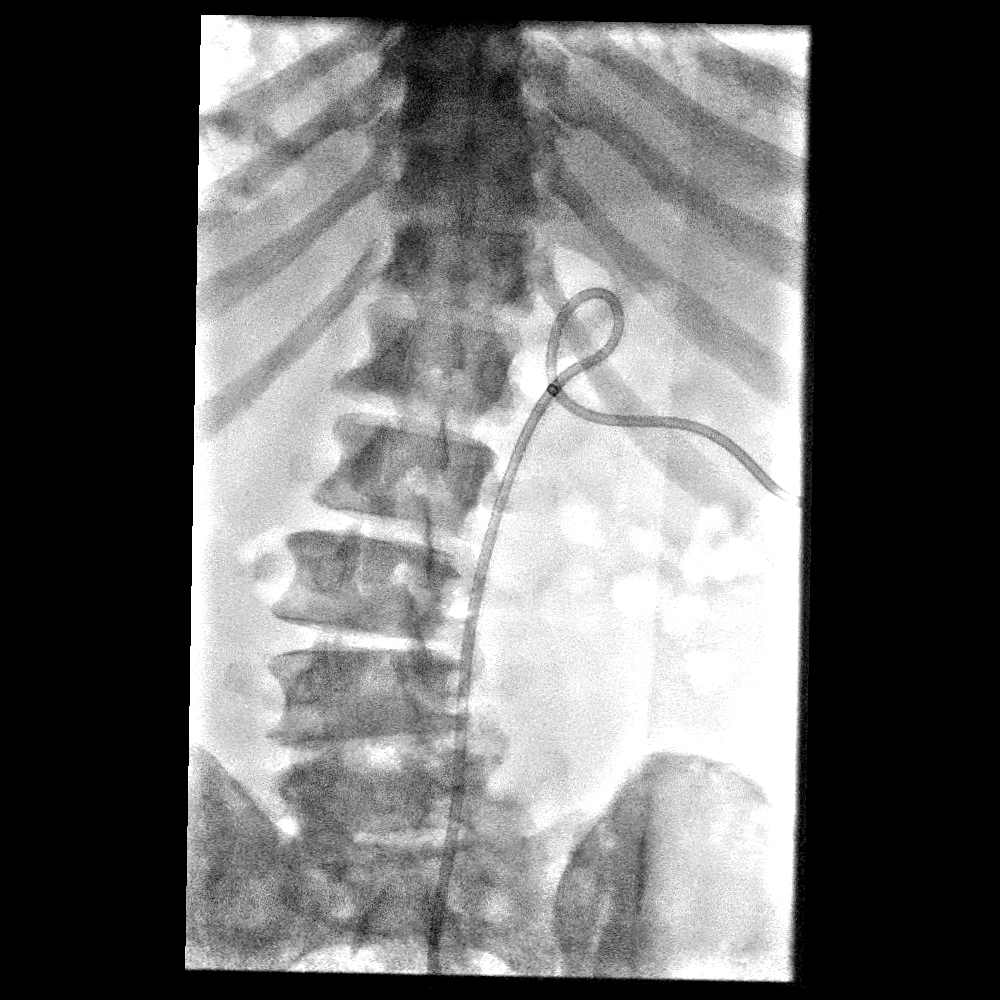
[frame 10/64]
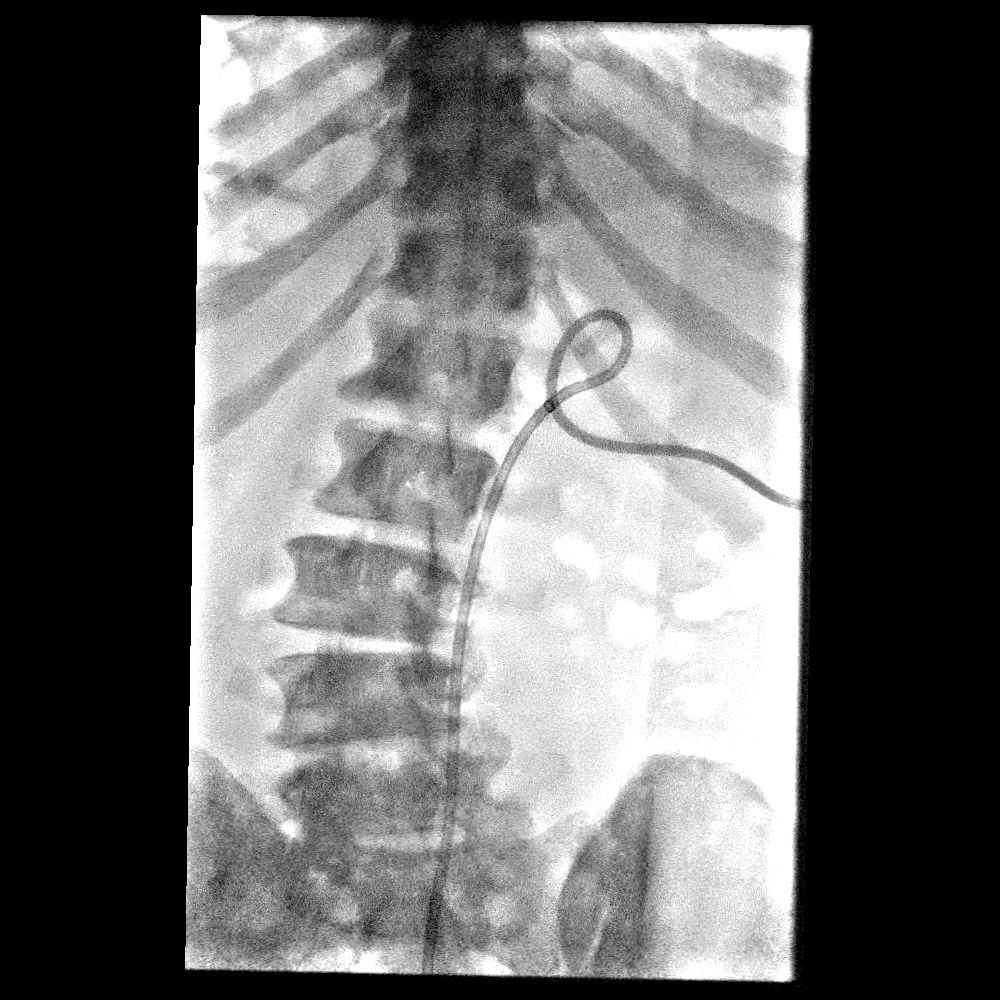
[frame 33/64]
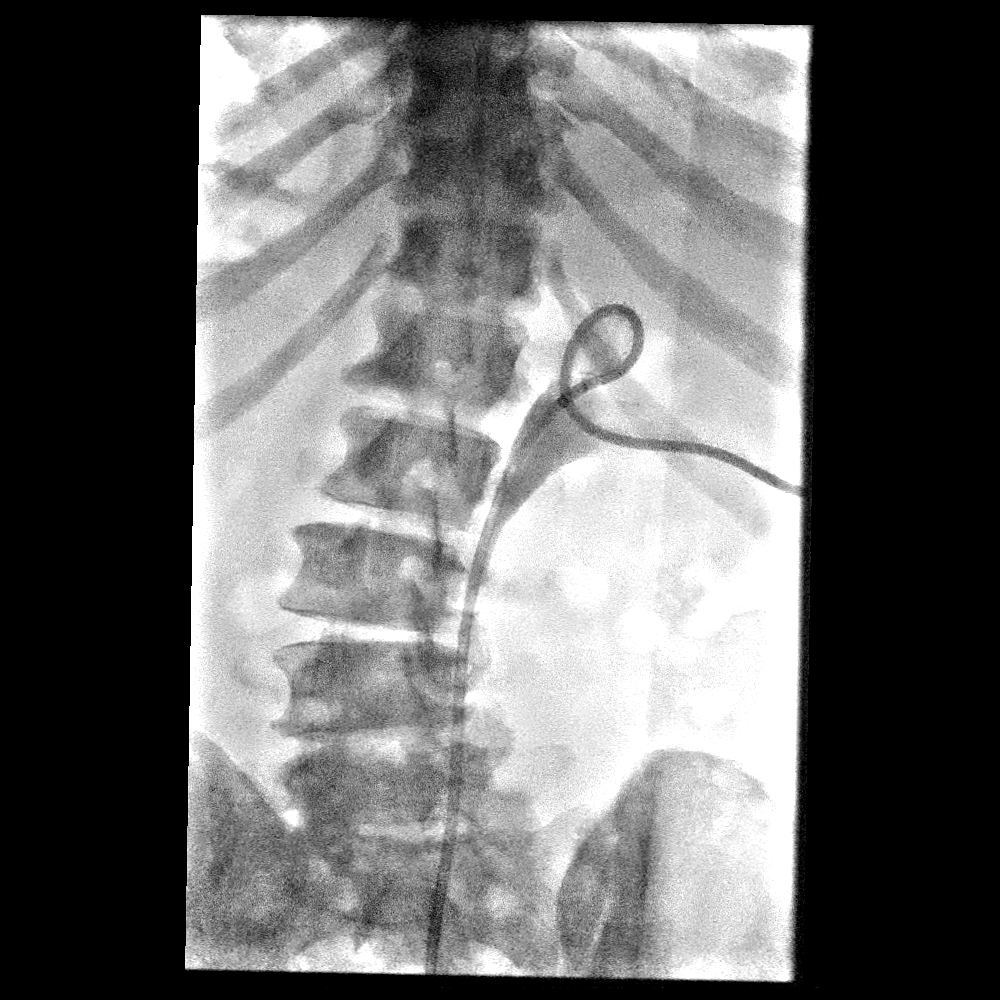
[frame 55/64]
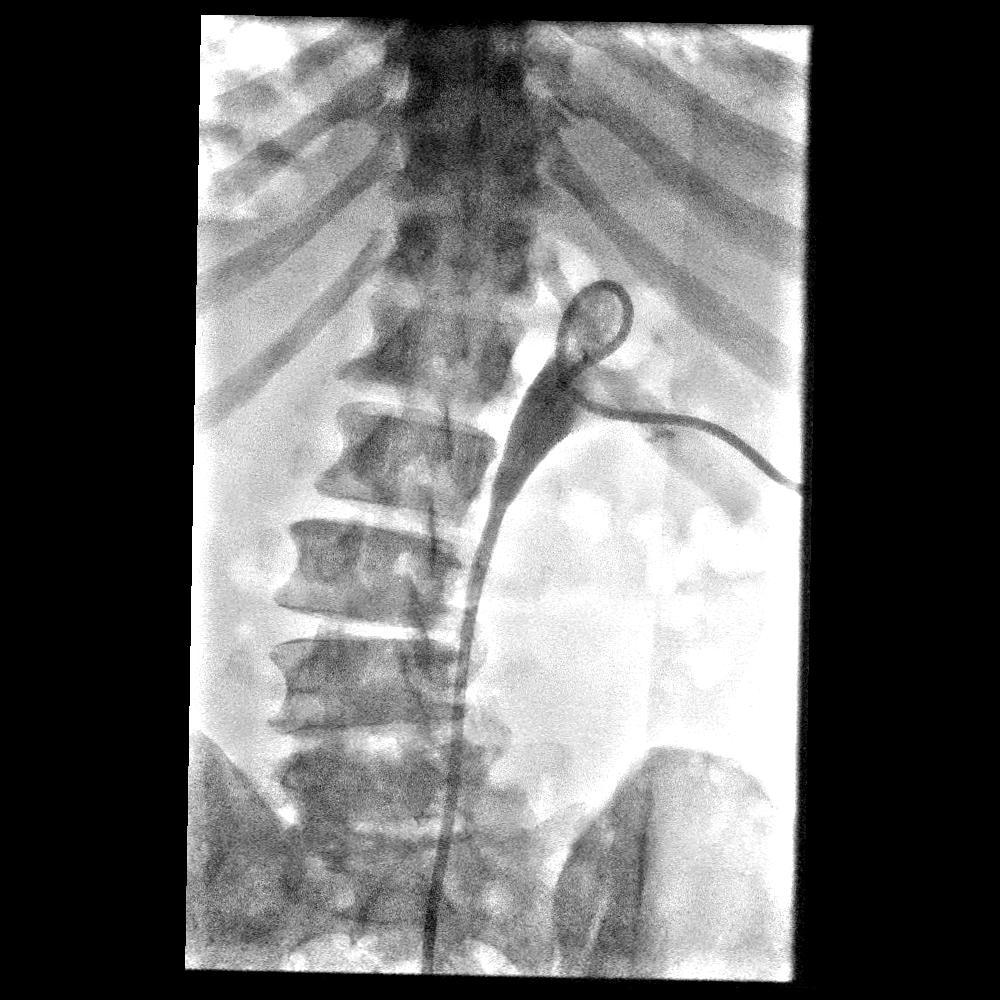

[4 of 4 positions shown; findings below may reference images not displayed]

The nephroureteral catheter WAS prepped with chlorhexidine, draped
in usual sterile fashion.

A small amount of contrast was injected through the nephroureteral
catheter to opacify the renal collecting system. The catheter was
cut and exchanged over a 0.035" stiff hydrophilic angiographic wire
for a new 10-French 28 cm nephroureteral pigtail catheter, formed in
the bladder and centrally within the collecting system under
fluoroscopy. Contrast injection confirms appropriate positioning.

The patient tolerated the procedure well.

COMPLICATIONS:
COMPLICATIONS
None.
IMPRESSION: 1. Technically successful exchange of antegrade nephroureteral
catheter under fluoroscopy. Consider 24 cm device at the next
exchange, if internalization is not performed.

## 2022-01-07 MED ORDER — IOHEXOL 300 MG/ML  SOLN
50.0000 mL | Freq: Once | INTRAMUSCULAR | Status: AC | PRN
  Administered 2022-01-07: 10 mL

## 2022-01-07 MED ORDER — LIDOCAINE HCL 1 % IJ SOLN
INTRAMUSCULAR | Status: AC
Start: 2022-01-07 — End: 2022-01-07
  Administered 2022-01-07: 10 mL
  Filled 2022-01-07: qty 20

## 2022-01-21 ENCOUNTER — Inpatient Hospital Stay: Payer: Medicare Other

## 2022-01-21 ENCOUNTER — Other Ambulatory Visit: Payer: Self-pay

## 2022-01-21 ENCOUNTER — Inpatient Hospital Stay (HOSPITAL_BASED_OUTPATIENT_CLINIC_OR_DEPARTMENT_OTHER): Payer: Medicare Other | Admitting: Oncology

## 2022-01-21 ENCOUNTER — Inpatient Hospital Stay: Payer: Medicare Other | Attending: Oncology

## 2022-01-21 VITALS — BP 123/72 | HR 74 | Temp 97.7°F | Resp 20 | Wt 220.7 lb

## 2022-01-21 DIAGNOSIS — Z7952 Long term (current) use of systemic steroids: Secondary | ICD-10-CM | POA: Diagnosis not present

## 2022-01-21 DIAGNOSIS — Z923 Personal history of irradiation: Secondary | ICD-10-CM | POA: Diagnosis not present

## 2022-01-21 DIAGNOSIS — N183 Chronic kidney disease, stage 3 unspecified: Secondary | ICD-10-CM | POA: Diagnosis not present

## 2022-01-21 DIAGNOSIS — C7951 Secondary malignant neoplasm of bone: Secondary | ICD-10-CM | POA: Insufficient documentation

## 2022-01-21 DIAGNOSIS — C61 Malignant neoplasm of prostate: Secondary | ICD-10-CM | POA: Diagnosis present

## 2022-01-21 DIAGNOSIS — Z862 Personal history of diseases of the blood and blood-forming organs and certain disorders involving the immune mechanism: Secondary | ICD-10-CM | POA: Insufficient documentation

## 2022-01-21 LAB — CMP (CANCER CENTER ONLY)
ALT: 10 U/L (ref 0–44)
AST: 12 U/L — ABNORMAL LOW (ref 15–41)
Albumin: 3.8 g/dL (ref 3.5–5.0)
Alkaline Phosphatase: 360 U/L — ABNORMAL HIGH (ref 38–126)
Anion gap: 5 (ref 5–15)
BUN: 15 mg/dL (ref 8–23)
CO2: 32 mmol/L (ref 22–32)
Calcium: 8.7 mg/dL — ABNORMAL LOW (ref 8.9–10.3)
Chloride: 107 mmol/L (ref 98–111)
Creatinine: 0.85 mg/dL (ref 0.61–1.24)
GFR, Estimated: >60 mL/min (ref >60)
Glucose, Bld: 117 mg/dL — ABNORMAL HIGH (ref 70–99)
Potassium: 3.7 mmol/L (ref 3.5–5.1)
Sodium: 144 mmol/L (ref 135–145)
Total Bilirubin: 1.3 mg/dL — ABNORMAL HIGH (ref 0.3–1.2)
Total Protein: 6.3 g/dL — ABNORMAL LOW (ref 6.5–8.1)

## 2022-01-21 LAB — CBC WITH DIFFERENTIAL (CANCER CENTER ONLY)
Abs Immature Granulocytes: 0.01 10*3/uL (ref 0.00–0.07)
Basophils Absolute: 0 10*3/uL (ref 0.0–0.1)
Basophils Relative: 1 %
Eosinophils Absolute: 0.1 10*3/uL (ref 0.0–0.5)
Eosinophils Relative: 2 %
HCT: 35.7 % — ABNORMAL LOW (ref 39.0–52.0)
Hemoglobin: 12 g/dL — ABNORMAL LOW (ref 13.0–17.0)
Immature Granulocytes: 0 %
Lymphocytes Relative: 23 %
Lymphs Abs: 0.8 10*3/uL (ref 0.7–4.0)
MCH: 31.5 pg (ref 26.0–34.0)
MCHC: 33.6 g/dL (ref 30.0–36.0)
MCV: 93.7 fL (ref 80.0–100.0)
Monocytes Absolute: 0.2 10*3/uL (ref 0.1–1.0)
Monocytes Relative: 7 %
Neutro Abs: 2.2 10*3/uL (ref 1.7–7.7)
Neutrophils Relative %: 67 %
Platelet Count: 168 10*3/uL (ref 150–400)
RBC: 3.81 MIL/uL — ABNORMAL LOW (ref 4.22–5.81)
RDW: 12.6 % (ref 11.5–15.5)
WBC Count: 3.3 10*3/uL — ABNORMAL LOW (ref 4.0–10.5)
nRBC: 0 % (ref 0.0–0.2)

## 2022-01-21 NOTE — Progress Notes (Signed)
Hematology and Oncology Follow Up Visit ? ?Erik Page ?[id number] ?[birth year] 71 y.o. ?01/21/2022 10:59 AM ?Erik Page, [name], Erik Gu[place], DO  ? 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  He presented with PSA of 1460 at the time of diagnosis. ? ? ?Prior Therapy:  ? ?He is status post percutaneous nephrostomy tube placement and retroperitoneal lymph node biopsy completed on September 30, 2021. ? ?He is status post Firmagon 240 mg on October 01, 2021. ? ?He is status post radiation therapy to the lumbar spine after receiving 30 Gray in 10 fractions in January 2023. ? ?Current therapy:  ? ?Eligard 30 mg every 4 months started on November 01, 2021.  This will be repeated in June 2023. ? ?Zytiga 1000 mg daily with prednisone 5 mg daily started in February 2023. ? ?Interim History: Erik Page presents today for follow-up visit.  Since last visit, he reports feeling well without any major complaints.  He denies any nausea, vomiting or abdominal pain.  He denies any complications related to Zytiga.  He denies any worsening bone pain or pathological fractures.  Continues to be active and attends to activities of daily living. ? ? ? ? ?Medications: Reviewed without changes. ?Current Outpatient Medications  ?Medication Sig Dispense Refill  ? abiraterone acetate (ZYTIGA) 250 MG tablet TAKE 4 TABLETS (1000 MG) DAILY. TAKE ON AN EMPTY STOMACH 1 HOUR BEFORE OR 2 HOURS AFTER A MEAL. 120 tablet 0  ? aspirin EC 81 MG EC tablet Take 1 tablet (81 mg total) by mouth daily.    ? atorvastatin (LIPITOR) 80 MG tablet Take 1 tablet (80 mg total) by mouth daily. TAKE ONE TABLET BY MOUTH DAILY  **MUST CALL MD FOR APPOINTMENT (Patient taking differently: Take 80 mg by mouth daily.) 90 tablet 3  ? calcium-vitamin D (OSCAL WITH D) 500-5 MG-MCG tablet Take 1 tablet by mouth 2 (two) times daily. 90 tablet 3  ? feeding supplement (ENSURE ENLIVE / ENSURE PLUS)  LIQD Take 237 mLs by mouth 2 (two) times daily between meals. 237 mL 12  ? ibuprofen (ADVIL) 600 MG tablet Take 1 tablet (600 mg total) by mouth 3 (three) times daily. 30 tablet 0  ? metoprolol tartrate (LOPRESSOR) 25 MG tablet Take 1 tablet (25 mg total) by mouth 2 (two) times daily. (Patient taking differently: Take 25 mg by mouth daily.) 180 tablet 3  ? nitroGLYCERIN (NITROSTAT) 0.4 MG SL tablet DISSOLVE 1 TAB UNDER TONGUE FOR CHEST PAIN - IF PAIN REMAINS AFTER 5 MIN, CALL 911 AND REPEAT DOSE. MAX 3 TABS IN 15 MINUTES (Patient taking differently: 0.4 mg every 5 (five) minutes as needed for chest pain.) 25 tablet 3  ? ondansetron (ZOFRAN) 4 MG tablet Take 1 tablet (4 mg total) by mouth every 6 (six) hours as needed for nausea. 20 tablet 0  ? oxyCODONE (OXY IR/ROXICODONE) 5 MG immediate release tablet Take 1 tablet (5 mg total) by mouth every 4 (four) hours as needed for moderate pain. 30 tablet 0  ? oxyCODONE-acetaminophen (PERCOCET/ROXICET) 5-325 MG tablet Take 1-2 tablets by mouth every 4 (four) hours as needed for moderate pain. 30 tablet 0  ? predniSONE (DELTASONE) 5 MG tablet Take 1 tablet (5 mg total) by mouth daily with breakfast. 90 tablet 3  ? senna-docusate (SENOKOT-S) 8.6-50 MG tablet Take 1 tablet by mouth at bedtime as needed for mild constipation. 30 tablet 0  ? ?No current facility-administered medications for this visit.  ? ? ? ?  Allergies: No Known Allergies ? ? ?Physical Exam: ?Blood pressure 123/72, pulse 74, temperature 97.7 ?F (36.5 ?C), resp. rate 20, weight 220 lb 11.2 oz (100.1 kg), SpO2 100 %. ? ?ECOG: 1 ? ? ?General appearance: Alert, awake without any distress. ?Head: Atraumatic without abnormalities ?Oropharynx: Without any thrush or ulcers. ?Eyes: No scleral icterus. ?Lymph nodes: No lymphadenopathy noted in the cervical, supraclavicular, or axillary nodes ?Heart:regular rate and rhythm, without any murmurs or gallops.   ?Lung: Clear to auscultation without any rhonchi, wheezes or  dullness to percussion. ?Abdomin: Soft, nontender without any shifting dullness or ascites. ?Musculoskeletal: No clubbing or cyanosis. ?Neurological: No motor or sensory deficits. ?Skin: No rashes or lesions. ? ? ? ? ?Lab Results: ?Lab Results  ?Component Value Date  ? WBC 3.1 (L) 12/03/2021  ? HGB 10.5 (L) 12/03/2021  ? HCT 32.5 (L) 12/03/2021  ? MCV 99.1 12/03/2021  ? PLT 195 12/03/2021  ? ?  Chemistry   ?   ?Component Value Date/Time  ? NA 143 12/03/2021 0850  ? K 3.7 12/03/2021 0850  ? CL 110 12/03/2021 0850  ? CO2 29 12/03/2021 0850  ? BUN 11 12/03/2021 0850  ? CREATININE 0.75 12/03/2021 0850  ?    ?Component Value Date/Time  ? CALCIUM 8.4 (L) 12/03/2021 0850  ? ALKPHOS 559 (H) 12/03/2021 0850  ? AST 12 (L) 12/03/2021 0850  ? ALT 15 12/03/2021 0850  ? BILITOT 1.0 12/03/2021 0850  ?  ? ? Latest Reference Range & Units 09/28/21 21:15 11/01/21 10:57 12/03/21 08:50  ?Prostate Specific Ag, Serum 0.0 - 4.0 ng/mL  81.3 (H) 10.0 (H)  ?Prostatic Specific Antigen 0.00 - 4.00 ng/mL >1460.00    ?(H): Data is abnormally high ? ? ?Impression and Plan: ? ?71 year old with: ? ?1.  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   ?  ?His disease status was updated at this time and treatment choices were reviewed.  His PSA continues to decline appropriately at this time.  Complication associated with Zytiga were reviewed.  These would include hypertension, edema and adrenal insufficiency.  Alternative treatment options such as systemic chemotherapy could be used in the future if he develops advanced disease. ?  ?2.  Anemia: Resolved with hemoglobin currently up to 12.  No need for transfusion or growth factor support.  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 ?  ?3.  Bone pain: Resolved at this time.  This is related to prostate cancer and currently treated. ? ?4.  Androgen deprivation therapy: Next Eligard will be in June 2023. ?  ?5.  Hydronephrosis: His kidney function  had normalized.  This is related to malignancy. ?  ?6.  Prognosis and goals of care and disposition: His disease is incurable although aggressive measures are warranted. ? ?7.  Bone directed therapy: Risks and benefits of starting Xgeva were discussed at this time.  Complication occluding osteonecrosis of the jaw and hypocalcemia were reviewed.  His calcium level remains low and I recommended starting calcium and vitamin D supplements which she has not started at this time.  We will tentatively start Xgeva with the next visit. ? ? ?8.  Follow-up: He will return in 6 weeks for repeat follow-up. ? ? ?30  minutes were dedicated to this visit.  The time was spent on updating his disease status, treatment choices and future plan of care discussion. ? ? ?Zola Button, MD ?5/1/202310:59 AM ? ?

## 2022-01-21 NOTE — Progress Notes (Signed)
Per Dr. [name], we will hold off on xgeva injection today d/t pt not starting calcium supplement yet and calcium level is 8.7 mg/dL. Pt received copy of lab work and agreeable.  ?

## 2022-01-22 ENCOUNTER — Telehealth: Payer: Self-pay | Admitting: *Deleted

## 2022-01-22 ENCOUNTER — Telehealth: Payer: Self-pay | Admitting: Oncology

## 2022-01-22 LAB — PROSTATE-SPECIFIC AG, SERUM (LABCORP): Prostate Specific Ag, Serum: 3.6 ng/mL (ref 0.0–4.0)

## 2022-01-22 NOTE — Telephone Encounter (Signed)
LM to call Dr Hazeline Junker office. ?

## 2022-01-22 NOTE — Telephone Encounter (Signed)
Scheduled per 5/1 los, message has been left with pt ?

## 2022-01-22 NOTE — Telephone Encounter (Signed)
-----   Message from [name], MD sent at 01/22/2022  9:53 AM EDT ----- ?Please let him know his PSA is down ?

## 2022-01-23 ENCOUNTER — Ambulatory Visit: Payer: Medicare Other

## 2022-01-23 ENCOUNTER — Other Ambulatory Visit: Payer: Medicare Other

## 2022-01-23 ENCOUNTER — Ambulatory Visit: Payer: Medicare Other | Admitting: Oncology

## 2022-01-28 ENCOUNTER — Other Ambulatory Visit: Payer: Self-pay | Admitting: Oncology

## 2022-01-28 DIAGNOSIS — C61 Malignant neoplasm of prostate: Secondary | ICD-10-CM

## 2022-02-04 ENCOUNTER — Other Ambulatory Visit (HOSPITAL_COMMUNITY): Payer: Self-pay | Admitting: Urology

## 2022-02-04 DIAGNOSIS — N1339 Other hydronephrosis: Secondary | ICD-10-CM

## 2022-02-20 ENCOUNTER — Ambulatory Visit (HOSPITAL_COMMUNITY)
Admission: RE | Admit: 2022-02-20 | Discharge: 2022-02-20 | Disposition: A | Discharge status: Home / Self Care | Payer: Medicare Other | Source: Ambulatory Visit | Attending: Urology | Admitting: Urology

## 2022-02-20 DIAGNOSIS — N1339 Other hydronephrosis: Secondary | ICD-10-CM | POA: Insufficient documentation

## 2022-02-20 DIAGNOSIS — Z436 Encounter for attention to other artificial openings of urinary tract: Secondary | ICD-10-CM | POA: Insufficient documentation

## 2022-02-20 HISTORY — PX: IR NEPHROSTOMY EXCHANGE RIGHT: IMG6070

## 2022-02-20 IMAGING — XA IR EXCHANGE NEPHROSTOMY RIGHT
2 series · 13 of 22 positions shown · non-contrast
Comparison: none

CLINICAL DATA: History of prostate carcinoma with right
hydronephrosis, status post percutaneous drainage, indwelling
nephroureteral catheter. He has done well with capping of the
nephroureteral catheter. Catheter conversion for capping trial
requested.

EXAM:
RIGHT PERCUTANEOUS NEPHROSTOMY CATHETER EXCHANGE UNDER FLUOROSCOPY
FLUOROSCOPY:
Radiation Exposure Index (as provided by the fluoroscopic device): 7
mGy air Kerma
TECHNIQUE: the previously placed nephroureteral catheter and surrounding skin
were prepped with Betadine, draped in usual sterile fashion.

[aw electronic film · 12 of 21 slices shown (1 of 2)]
[im 1/21]
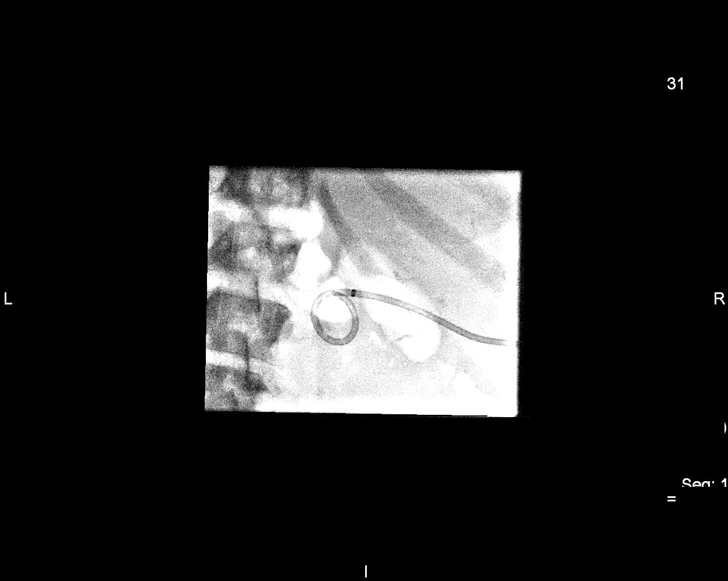
[im 3/21]
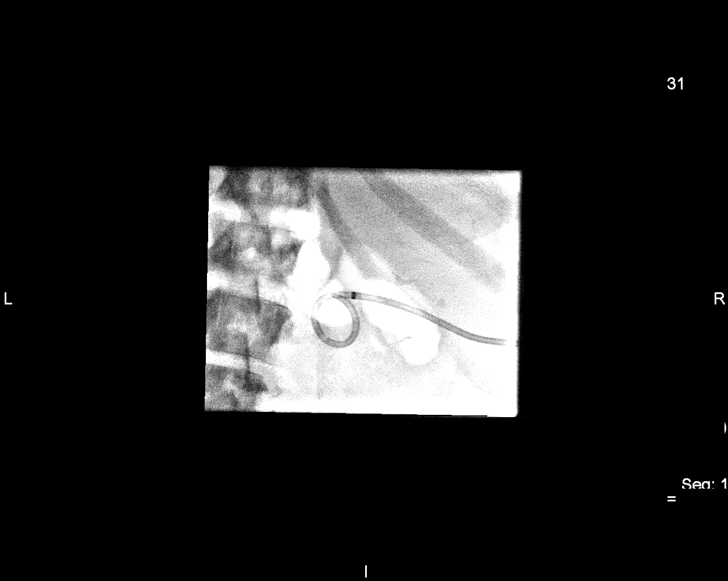
[im 5/21]
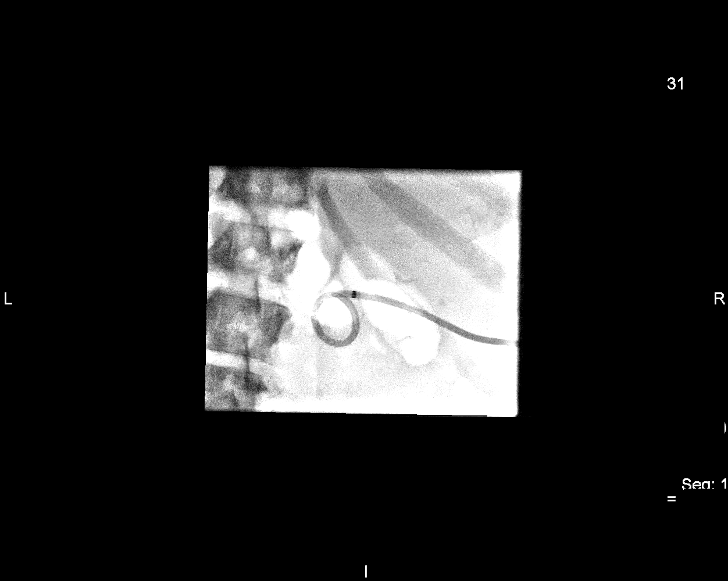
[im 6/21]
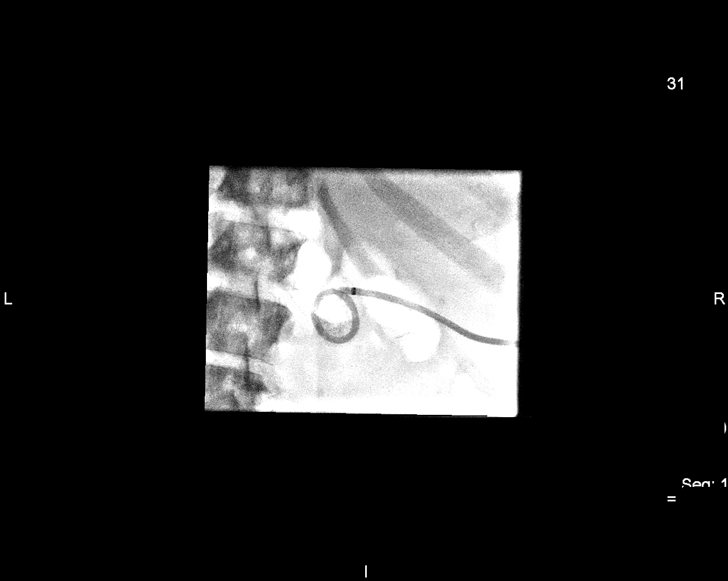
[im 8/21]
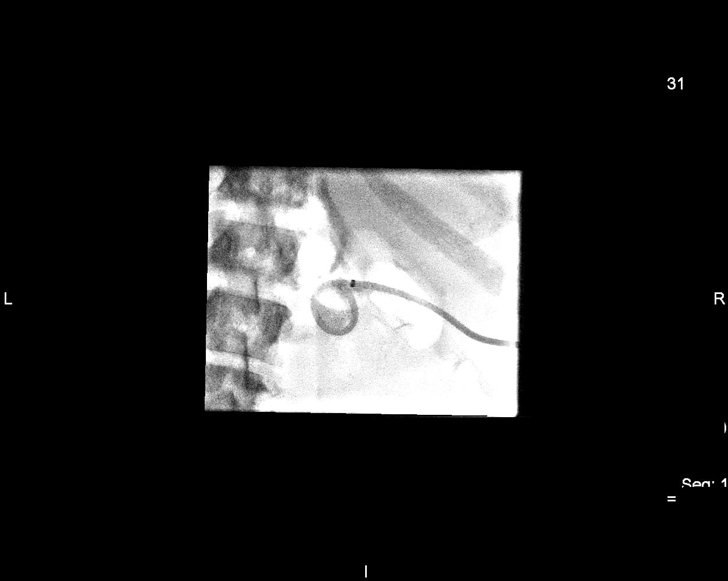
[im 10/21]
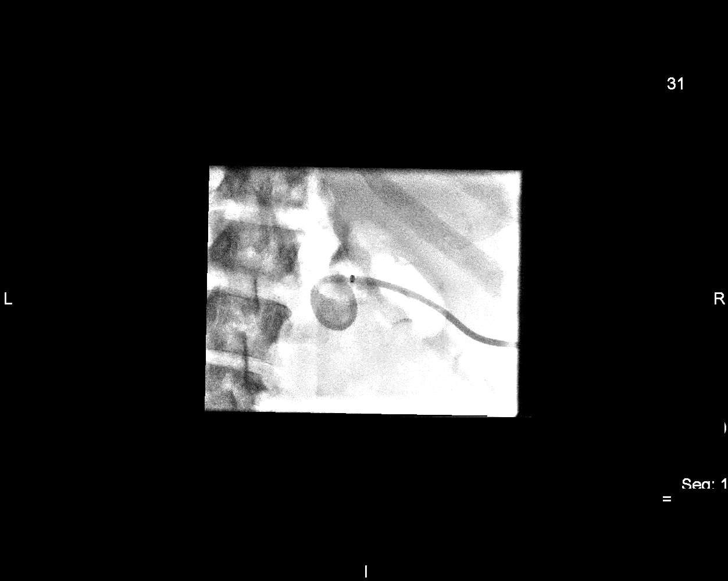
[im 12/21]
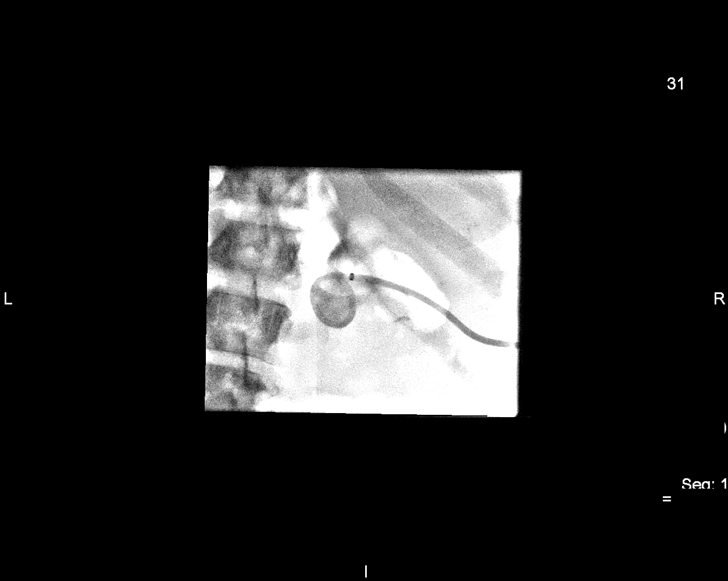
[im 13/21]
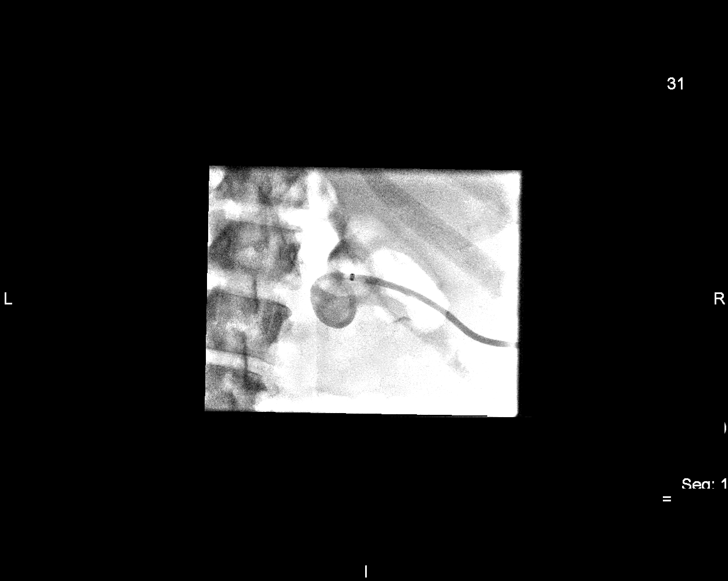
[im 15/21]
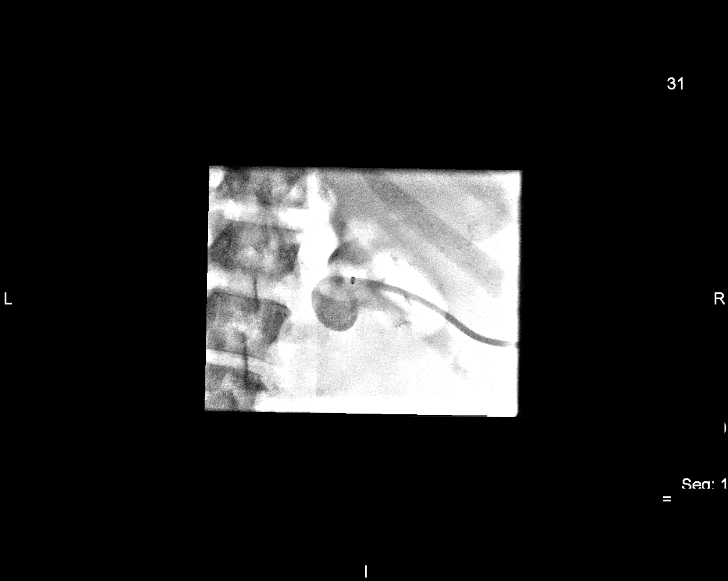
[im 17/21]
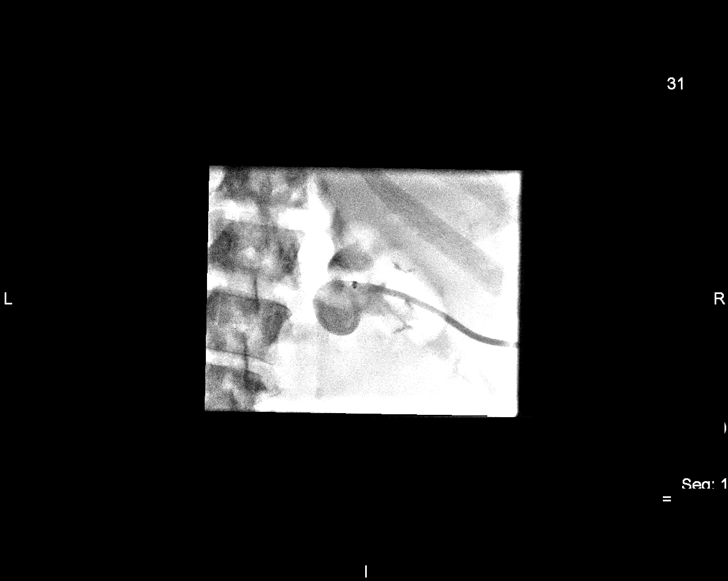
[im 18/21]
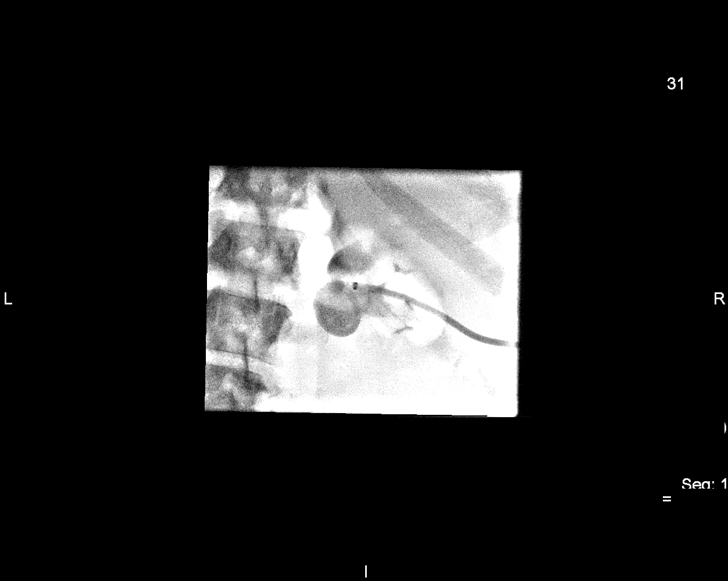
[im 20/21]
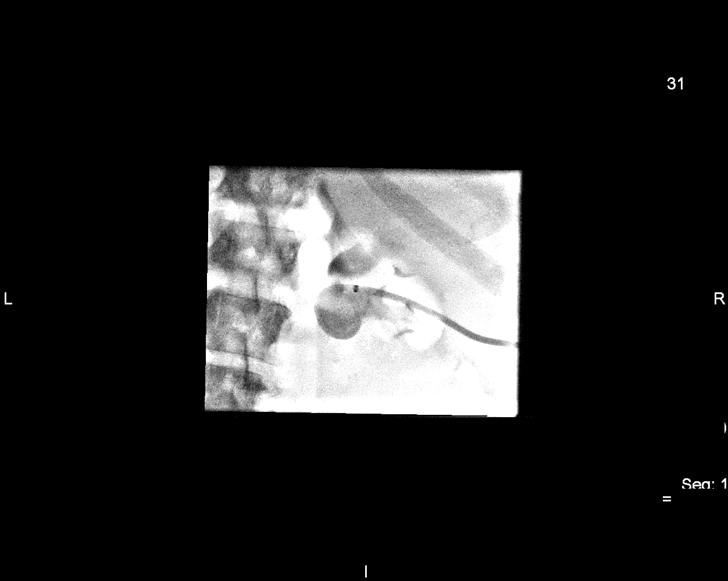

[aw electronic film · 1 of 1 slices shown (2 of 2)]
[im 1/1]
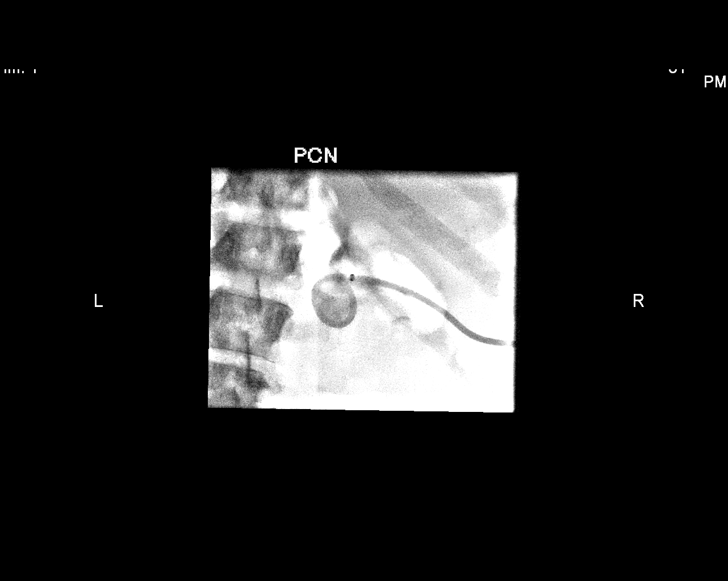

[13 of 22 positions shown; findings below may reference images not displayed]

A small amount of contrast was injected through the right catheter
to opacify the renal collecting system. The catheter was cut and
exchanged over a 0.035" angiographic wire for a new 10-French
pigtail catheter, formed centrally within the collecting system
under fluoroscopy. Contrast injection confirms appropriate
positioning.

Catheter was secured externally with a Statlock. The patient
tolerated the procedure well.

Complications: None.
IMPRESSION: 1. Technically successful exchange of nephroureteral for a 10 French
nephrostomy catheter under fluoroscopy.
The catheter was capped to allow internal drainage trial.
Patient will follow-up with urologist as previously scheduled.

## 2022-03-05 ENCOUNTER — Other Ambulatory Visit: Payer: Self-pay | Admitting: Oncology

## 2022-03-05 ENCOUNTER — Other Ambulatory Visit: Payer: Medicare Other

## 2022-03-05 ENCOUNTER — Ambulatory Visit: Payer: Medicare Other | Admitting: Oncology

## 2022-03-05 ENCOUNTER — Ambulatory Visit: Payer: Medicare Other

## 2022-03-05 DIAGNOSIS — C61 Malignant neoplasm of prostate: Secondary | ICD-10-CM

## 2022-03-15 ENCOUNTER — Inpatient Hospital Stay: Payer: Medicare Other | Attending: Oncology

## 2022-03-15 ENCOUNTER — Inpatient Hospital Stay: Payer: Medicare Other

## 2022-03-15 ENCOUNTER — Other Ambulatory Visit: Payer: Self-pay

## 2022-03-15 ENCOUNTER — Inpatient Hospital Stay (HOSPITAL_BASED_OUTPATIENT_CLINIC_OR_DEPARTMENT_OTHER): Payer: Medicare Other | Admitting: Oncology

## 2022-03-15 VITALS — BP 128/68 | HR 64 | Temp 98.4°F | Resp 18

## 2022-03-15 VITALS — BP 125/76 | HR 72 | Temp 97.8°F | Resp 18 | Ht 74.0 in | Wt 225.8 lb

## 2022-03-15 DIAGNOSIS — C61 Malignant neoplasm of prostate: Secondary | ICD-10-CM

## 2022-03-15 DIAGNOSIS — N183 Chronic kidney disease, stage 3 unspecified: Secondary | ICD-10-CM | POA: Diagnosis not present

## 2022-03-15 DIAGNOSIS — C7951 Secondary malignant neoplasm of bone: Secondary | ICD-10-CM | POA: Diagnosis not present

## 2022-03-15 DIAGNOSIS — Z923 Personal history of irradiation: Secondary | ICD-10-CM | POA: Insufficient documentation

## 2022-03-15 DIAGNOSIS — D630 Anemia in neoplastic disease: Secondary | ICD-10-CM | POA: Diagnosis not present

## 2022-03-15 DIAGNOSIS — Z7952 Long term (current) use of systemic steroids: Secondary | ICD-10-CM | POA: Insufficient documentation

## 2022-03-15 LAB — CMP (CANCER CENTER ONLY)
ALT: 12 U/L (ref 0–44)
AST: 14 U/L — ABNORMAL LOW (ref 15–41)
Albumin: 3.8 g/dL (ref 3.5–5.0)
Alkaline Phosphatase: 260 U/L — ABNORMAL HIGH (ref 38–126)
Anion gap: 4 — ABNORMAL LOW (ref 5–15)
BUN: 15 mg/dL (ref 8–23)
CO2: 33 mmol/L — ABNORMAL HIGH (ref 22–32)
Calcium: 9 mg/dL (ref 8.9–10.3)
Chloride: 107 mmol/L (ref 98–111)
Creatinine: 0.92 mg/dL (ref 0.61–1.24)
GFR, Estimated: >60 mL/min (ref >60)
Glucose, Bld: 100 mg/dL — ABNORMAL HIGH (ref 70–99)
Potassium: 4.1 mmol/L (ref 3.5–5.1)
Sodium: 144 mmol/L (ref 135–145)
Total Bilirubin: 1.7 mg/dL — ABNORMAL HIGH (ref 0.3–1.2)
Total Protein: 6.2 g/dL — ABNORMAL LOW (ref 6.5–8.1)

## 2022-03-15 LAB — CBC WITH DIFFERENTIAL (CANCER CENTER ONLY)
Abs Immature Granulocytes: 0.01 10*3/uL (ref 0.00–0.07)
Basophils Absolute: 0 10*3/uL (ref 0.0–0.1)
Basophils Relative: 1 %
Eosinophils Absolute: 0.1 10*3/uL (ref 0.0–0.5)
Eosinophils Relative: 3 %
HCT: 35 % — ABNORMAL LOW (ref 39.0–52.0)
Hemoglobin: 12 g/dL — ABNORMAL LOW (ref 13.0–17.0)
Immature Granulocytes: 0 %
Lymphocytes Relative: 38 %
Lymphs Abs: 1.1 10*3/uL (ref 0.7–4.0)
MCH: 30.8 pg (ref 26.0–34.0)
MCHC: 34.3 g/dL (ref 30.0–36.0)
MCV: 90 fL (ref 80.0–100.0)
Monocytes Absolute: 0.2 10*3/uL (ref 0.1–1.0)
Monocytes Relative: 8 %
Neutro Abs: 1.4 10*3/uL — ABNORMAL LOW (ref 1.7–7.7)
Neutrophils Relative %: 50 %
Platelet Count: 165 10*3/uL (ref 150–400)
RBC: 3.89 MIL/uL — ABNORMAL LOW (ref 4.22–5.81)
RDW: 13.5 % (ref 11.5–15.5)
WBC Count: 2.8 10*3/uL — ABNORMAL LOW (ref 4.0–10.5)
nRBC: 0 % (ref 0.0–0.2)

## 2022-03-15 MED ORDER — LEUPROLIDE ACETATE (4 MONTH) 30 MG ~~LOC~~ KIT
30.0000 mg | PACK | Freq: Once | SUBCUTANEOUS | Status: AC
  Administered 2022-03-15: 30 mg via SUBCUTANEOUS
  Filled 2022-03-15: qty 30

## 2022-03-15 MED ORDER — DENOSUMAB 120 MG/1.7ML ~~LOC~~ SOLN
120.0000 mg | Freq: Once | SUBCUTANEOUS | Status: AC
  Administered 2022-03-15: 120 mg via SUBCUTANEOUS
  Filled 2022-03-15: qty 1.7

## 2022-03-16 LAB — PROSTATE-SPECIFIC AG, SERUM (LABCORP): Prostate Specific Ag, Serum: 2.4 ng/mL (ref 0.0–4.0)

## 2022-03-18 ENCOUNTER — Telehealth: Payer: Self-pay | Admitting: *Deleted

## 2022-04-05 ENCOUNTER — Other Ambulatory Visit: Payer: Self-pay | Admitting: Oncology

## 2022-04-05 DIAGNOSIS — C61 Malignant neoplasm of prostate: Secondary | ICD-10-CM

## 2022-05-06 ENCOUNTER — Other Ambulatory Visit: Payer: Self-pay | Admitting: Oncology

## 2022-05-06 DIAGNOSIS — C61 Malignant neoplasm of prostate: Secondary | ICD-10-CM

## 2022-05-23 ENCOUNTER — Inpatient Hospital Stay: Payer: Medicare Other | Attending: Oncology

## 2022-05-23 ENCOUNTER — Inpatient Hospital Stay (HOSPITAL_BASED_OUTPATIENT_CLINIC_OR_DEPARTMENT_OTHER): Payer: Medicare Other | Admitting: Oncology

## 2022-05-23 ENCOUNTER — Other Ambulatory Visit: Payer: Self-pay

## 2022-05-23 ENCOUNTER — Inpatient Hospital Stay: Payer: Medicare Other

## 2022-05-23 VITALS — BP 133/81 | HR 79 | Temp 98.1°F | Resp 14 | Ht 74.0 in | Wt 232.6 lb

## 2022-05-23 DIAGNOSIS — C61 Malignant neoplasm of prostate: Secondary | ICD-10-CM | POA: Insufficient documentation

## 2022-05-23 DIAGNOSIS — Z923 Personal history of irradiation: Secondary | ICD-10-CM | POA: Insufficient documentation

## 2022-05-23 DIAGNOSIS — D649 Anemia, unspecified: Secondary | ICD-10-CM

## 2022-05-23 DIAGNOSIS — D630 Anemia in neoplastic disease: Secondary | ICD-10-CM | POA: Insufficient documentation

## 2022-05-23 DIAGNOSIS — C7951 Secondary malignant neoplasm of bone: Secondary | ICD-10-CM | POA: Insufficient documentation

## 2022-05-23 DIAGNOSIS — Z7952 Long term (current) use of systemic steroids: Secondary | ICD-10-CM | POA: Diagnosis not present

## 2022-05-23 DIAGNOSIS — N1330 Unspecified hydronephrosis: Secondary | ICD-10-CM | POA: Diagnosis not present

## 2022-05-23 DIAGNOSIS — N183 Chronic kidney disease, stage 3 unspecified: Secondary | ICD-10-CM | POA: Insufficient documentation

## 2022-05-23 LAB — CBC WITH DIFFERENTIAL (CANCER CENTER ONLY)
Abs Immature Granulocytes: 0.01 10*3/uL (ref 0.00–0.07)
Basophils Absolute: 0 10*3/uL (ref 0.0–0.1)
Basophils Relative: 1 %
Eosinophils Absolute: 0.1 10*3/uL (ref 0.0–0.5)
Eosinophils Relative: 2 %
HCT: 38.9 % — ABNORMAL LOW (ref 39.0–52.0)
Hemoglobin: 13.3 g/dL (ref 13.0–17.0)
Immature Granulocytes: 0 %
Lymphocytes Relative: 32 %
Lymphs Abs: 1.1 10*3/uL (ref 0.7–4.0)
MCH: 31.2 pg (ref 26.0–34.0)
MCHC: 34.2 g/dL (ref 30.0–36.0)
MCV: 91.3 fL (ref 80.0–100.0)
Monocytes Absolute: 0.2 10*3/uL (ref 0.1–1.0)
Monocytes Relative: 7 %
Neutro Abs: 2 10*3/uL (ref 1.7–7.7)
Neutrophils Relative %: 58 %
Platelet Count: 177 10*3/uL (ref 150–400)
RBC: 4.26 MIL/uL (ref 4.22–5.81)
RDW: 13.7 % (ref 11.5–15.5)
WBC Count: 3.5 10*3/uL — ABNORMAL LOW (ref 4.0–10.5)
nRBC: 0 % (ref 0.0–0.2)

## 2022-05-23 LAB — CMP (CANCER CENTER ONLY)
ALT: 15 U/L (ref 0–44)
AST: 15 U/L (ref 15–41)
Albumin: 4.1 g/dL (ref 3.5–5.0)
Alkaline Phosphatase: 127 U/L — ABNORMAL HIGH (ref 38–126)
Anion gap: 4 — ABNORMAL LOW (ref 5–15)
BUN: 16 mg/dL (ref 8–23)
CO2: 31 mmol/L (ref 22–32)
Calcium: 8.9 mg/dL (ref 8.9–10.3)
Chloride: 107 mmol/L (ref 98–111)
Creatinine: 0.87 mg/dL (ref 0.61–1.24)
GFR, Estimated: >60 mL/min (ref >60)
Glucose, Bld: 115 mg/dL — ABNORMAL HIGH (ref 70–99)
Potassium: 4.4 mmol/L (ref 3.5–5.1)
Sodium: 142 mmol/L (ref 135–145)
Total Bilirubin: 1.4 mg/dL — ABNORMAL HIGH (ref 0.3–1.2)
Total Protein: 6.5 g/dL (ref 6.5–8.1)

## 2022-05-23 MED ORDER — DENOSUMAB 120 MG/1.7ML ~~LOC~~ SOLN
120.0000 mg | Freq: Once | SUBCUTANEOUS | Status: AC
  Administered 2022-05-23: 120 mg via SUBCUTANEOUS
  Filled 2022-05-23: qty 1.7

## 2022-05-23 NOTE — Progress Notes (Signed)
Hematology and Oncology Follow Up Visit  Erik Page [id number] Aug 13, 1951 71 y.o. 05/23/2022 8:32 AM [name], [name], [name], DO   Principle Diagnosis: 71 year old man with castration-sensitive advanced prostate cancer with disease to the bone diagnosed in January 2023 with PSA 1460.     Prior Therapy:   He is status post percutaneous nephrostomy tube placement and retroperitoneal lymph node biopsy completed on September 30, 2021.  He is status post Firmagon 240 mg on October 01, 2021.  He is status post radiation therapy to the lumbar spine after receiving 30 Gray in 10 fractions in January 2023.  Current therapy:   Eligard 30 mg every 4 months started on November 01, 2021.  This will be repeated in October 2023.  Zytiga 1000 mg daily with prednisone 5 mg daily started in February 2023.  Interim History: [name] presents today for a follow-up.  Since the last visit, he reports no major changes in his health.  He continues to tolerate Zytiga without any new issues.  He denies any nausea, vomiting or abdominal pain.  He denies any edema or excessive fatigue.  Performance status quality of life remains unchanged.  He denies any bone pain or pathological fractures.     Medications: Reviewed without changes. Current Outpatient Medications  Medication Sig Dispense Refill   abiraterone acetate (ZYTIGA) 250 MG tablet TAKE 4 TABLETS (1000 MG) DAILY. TAKE ON AN EMPTY STOMACH 1 HOUR BEFORE OR 2 HOURS AFTER A MEAL. 120 tablet 0   aspirin EC 81 MG EC tablet Take 1 tablet (81 mg total) by mouth daily.     atorvastatin (LIPITOR) 80 MG tablet Take 1 tablet (80 mg total) by mouth daily. TAKE ONE TABLET BY MOUTH DAILY  **MUST CALL MD FOR APPOINTMENT (Patient taking differently: Take 80 mg by mouth daily.) 90 tablet 3   calcium-vitamin D (OSCAL WITH D) 500-5 MG-MCG tablet Take 1 tablet by mouth 2 (two) times daily. 90 tablet 3   feeding supplement (ENSURE ENLIVE / ENSURE PLUS) LIQD Take 237  mLs by mouth 2 (two) times daily between meals. 237 mL 12   ibuprofen (ADVIL) 600 MG tablet Take 1 tablet (600 mg total) by mouth 3 (three) times daily. 30 tablet 0   metoprolol tartrate (LOPRESSOR) 25 MG tablet Take 1 tablet (25 mg total) by mouth 2 (two) times daily. (Patient taking differently: Take 25 mg by mouth daily.) 180 tablet 3   nitroGLYCERIN (NITROSTAT) 0.4 MG SL tablet DISSOLVE 1 TAB UNDER TONGUE FOR CHEST PAIN - IF PAIN REMAINS AFTER 5 MIN, CALL 911 AND REPEAT DOSE. MAX 3 TABS IN 15 MINUTES (Patient taking differently: 0.4 mg every 5 (five) minutes as needed for chest pain.) 25 tablet 3   ondansetron (ZOFRAN) 4 MG tablet Take 1 tablet (4 mg total) by mouth every 6 (six) hours as needed for nausea. 20 tablet 0   oxyCODONE (OXY IR/ROXICODONE) 5 MG immediate release tablet Take 1 tablet (5 mg total) by mouth every 4 (four) hours as needed for moderate pain. 30 tablet 0   oxyCODONE-acetaminophen (PERCOCET/ROXICET) 5-325 MG tablet Take 1-2 tablets by mouth every 4 (four) hours as needed for moderate pain. 30 tablet 0   predniSONE (DELTASONE) 5 MG tablet Take 1 tablet (5 mg total) by mouth daily with breakfast. 90 tablet 3   senna-docusate (SENOKOT-S) 8.6-50 MG tablet Take 1 tablet by mouth at bedtime as needed for mild constipation. 30 tablet 0   No current facility-administered medications for this visit.  Allergies: No Known Allergies   Physical Exam:  Blood pressure 133/81, pulse 79, temperature 98.1 F (36.7 C), temperature source Oral, resp. rate 14, height '6\' 2"'$  (1.88 m), weight 232 lb 9.6 oz (105.5 kg), SpO2 100 %.   ECOG: 1   General appearance: Alert, awake without any distress. Head: Atraumatic without abnormalities Oropharynx: Without any thrush or ulcers. Eyes: No scleral icterus. Lymph nodes: No lymphadenopathy noted in the cervical, supraclavicular, or axillary nodes Heart:regular rate and rhythm, without any murmurs or gallops.   Lung: Clear to auscultation  without any rhonchi, wheezes or dullness to percussion. Abdomin: Soft, nontender without any shifting dullness or ascites. Musculoskeletal: No clubbing or cyanosis. Neurological: No motor or sensory deficits. Skin: No rashes or lesions.      Lab Results: Lab Results  Component Value Date   WBC 2.8 (L) 03/15/2022   HGB 12.0 (L) 03/15/2022   HCT 35.0 (L) 03/15/2022   MCV 90.0 03/15/2022   PLT 165 03/15/2022     Chemistry      Component Value Date/Time   NA 144 03/15/2022 0826   K 4.1 03/15/2022 0826   CL 107 03/15/2022 0826   CO2 33 (H) 03/15/2022 0826   BUN 15 03/15/2022 0826   CREATININE 0.92 03/15/2022 0826      Component Value Date/Time   CALCIUM 9.0 03/15/2022 0826   ALKPHOS 260 (H) 03/15/2022 0826   AST 14 (L) 03/15/2022 0826   ALT 12 03/15/2022 0826   BILITOT 1.7 (H) 03/15/2022 0826      Latest Reference Range & Units 11/01/21 10:57 12/03/21 08:50 01/21/22 10:49 03/15/22 08:26  Prostate Specific Ag, Serum 0.0 - 4.0 ng/mL 81.3 (H) 10.0 (H) 3.6 2.4  (H): Data is abnormally high  Impression and Plan:  71 year old with:  1.  Castration-sensitive advanced prostate cancer with lymphadenopathy diagnosed in January 2023.     The natural course of this disease was discussed at this time and treatment choices were reviewed.  He continues to have a robust response with a PSA decline down to 2.4.  Complication associated with Zytiga were discussed.  These include edema, fatigue and hypertension.  Alternative treatment options including systemic chemotherapy were reiterated.  He is agreeable to continue.  Alternative treatment option including systemic chemotherapy or PARP inhibitors would be considered if he has castration-resistant disease.   2.  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  His hemoglobin is back to normal range currently at 13.   3.  Bone pain: Related to prostate cancer and has resolved at this time.  4.  Androgen deprivation  therapy: This will be repeated in October 2023.   5.  Hydronephrosis: His kidney function continues to be normal without intervention.  His stent has been removed at this time.   6.  Prognosis and goals of care and disposition: His disease is incurable although aggressive measures are warranted.  7.  Bone directed therapy: He is currently on Xgeva which will be repeated today.  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   8.  Follow-up: He will return in 2 months for a follow-up.   30  minutes were dedicated to this visit.  The time spent on reviewing laboratory data, disease status update and outlining future plan of care discussion.   Zola Button, MD 8/31/20238:32 AM

## 2022-05-23 NOTE — Patient Instructions (Signed)
Denosumab Injection (Oncology) What is this medication? DENOSUMAB (den oh SUE mab) prevents weakened bones caused by cancer. It may also be used to treat noncancerous bone tumors that cannot be removed by surgery. It can also be used to treat high calcium levels in the blood caused by cancer. It works by blocking a protein that causes bones to break down quickly. This slows down the release of calcium from bones, which lowers calcium levels in your blood. It also makes your bones stronger and less likely to break (fracture). This medicine may be used for other purposes; ask your health care provider or pharmacist if you have questions. COMMON BRAND NAME(S): XGEVA What should I tell my care team before I take this medication? They need to know if you have any of these conditions: Dental disease Having surgery or tooth extraction Infection Kidney disease Low levels of calcium or vitamin D in the blood Malnutrition On hemodialysis Skin conditions or sensitivity Thyroid or parathyroid disease An unusual reaction to denosumab, other medications, foods, dyes, or preservatives Pregnant or trying to get pregnant Breast-feeding How should I use this medication? This medication is for injection under the skin. It is given by your care team in a hospital or clinic setting. A special MedGuide will be given to you before each treatment. Be sure to read this information carefully each time. Talk to your care team about the use of this medication in children. While it may be prescribed for children as young as 13 years for selected conditions, precautions do apply. Overdosage: If you think you have taken too much of this medicine contact a poison control center or emergency room at once. NOTE: This medicine is only for you. Do not share this medicine with others. What if I miss a dose? Keep appointments for follow-up doses. It is important not to miss your dose. Call your care team if you are unable to  keep an appointment. What may interact with this medication? Do not take this medication with any of the following: Other medications containing denosumab This medication may also interact with the following: Medications that lower your chance of fighting infection Steroid medications, such as prednisone or cortisone This list may not describe all possible interactions. Give your health care provider a list of all the medicines, herbs, non-prescription drugs, or dietary supplements you use. Also tell them if you smoke, drink alcohol, or use illegal drugs. Some items may interact with your medicine. What should I watch for while using this medication? Your condition will be monitored carefully while you are receiving this medication. You may need blood work while taking this medication. This medication may increase your risk of getting an infection. Call your care team for advice if you get a fever, chills, sore throat, or other symptoms of a cold or flu. Do not treat yourself. Try to avoid being around people who are sick. You should make sure you get enough calcium and vitamin D while you are taking this medication, unless your care team tells you not to. Discuss the foods you eat and the vitamins you take with your care team. Some people who take this medication have severe bone, joint, or muscle pain. This medication may also increase your risk for jaw problems or a broken thigh bone. Tell your care team right away if you have severe pain in your jaw, bones, joints, or muscles. Tell your care team if you have any pain that does not go away or that gets worse. Talk   to your care team if you may be pregnant. Serious birth defects can occur if you take this medication during pregnancy and for 5 months after the last dose. You will need a negative pregnancy test before starting this medication. Contraception is recommended while taking this medication and for 5 months after the last dose. Your care team  can help you find the option that works for you. What side effects may I notice from receiving this medication? Side effects that you should report to your care team as soon as possible: Allergic reactions--skin rash, itching, hives, swelling of the face, lips, tongue, or throat Bone, joint, or muscle pain Low calcium level--muscle pain or cramps, confusion, tingling, or numbness in the hands or feet Osteonecrosis of the jaw--pain, swelling, or redness in the mouth, numbness of the jaw, poor healing after dental work, unusual discharge from the mouth, visible bones in the mouth Side effects that usually do not require medical attention (report to your care team if they continue or are bothersome): Cough Diarrhea Fatigue Headache Nausea This list may not describe all possible side effects. Call your doctor for medical advice about side effects. You may report side effects to FDA at 1-800-FDA-1088. Where should I keep my medication? This medication is given in a hospital or clinic. It will not be stored at home. NOTE: This sheet is a summary. It may not cover all possible information. If you have questions about this medicine, talk to your doctor, pharmacist, or health care provider.  2023 Elsevier/Gold Standard (2022-01-28 00:00:00)  

## 2022-05-24 ENCOUNTER — Telehealth: Payer: Self-pay | Admitting: *Deleted

## 2022-05-24 LAB — PROSTATE-SPECIFIC AG, SERUM (LABCORP): Prostate Specific Ag, Serum: 2.1 ng/mL (ref 0.0–4.0)

## 2022-05-24 NOTE — Telephone Encounter (Signed)
PC to patient, informed him his PSA is 2.1, he verbalizes understanding.

## 2022-05-24 NOTE — Telephone Encounter (Signed)
-----   Message from [name], MD sent at 05/24/2022  3:39 PM EDT ----- Please let him know his PSA is down

## 2022-06-03 ENCOUNTER — Telehealth: Payer: Self-pay | Admitting: Oncology

## 2022-06-03 NOTE — Telephone Encounter (Signed)
Scheduled per 08/31 los, called and spoke with patient's spouse. Patient will be notified.

## 2022-06-04 ENCOUNTER — Other Ambulatory Visit: Payer: Self-pay | Admitting: Oncology

## 2022-06-04 DIAGNOSIS — C61 Malignant neoplasm of prostate: Secondary | ICD-10-CM

## 2022-07-01 ENCOUNTER — Other Ambulatory Visit: Payer: Self-pay | Admitting: Oncology

## 2022-07-01 DIAGNOSIS — C61 Malignant neoplasm of prostate: Secondary | ICD-10-CM

## 2022-07-03 ENCOUNTER — Telehealth: Payer: Self-pay | Admitting: Oncology

## 2022-07-03 NOTE — Telephone Encounter (Signed)
Rescheduled per 10/27 per provider pal, called patient regarding upcoming rescheduled appointments.

## 2022-07-19 ENCOUNTER — Other Ambulatory Visit: Payer: Medicare Other

## 2022-07-19 ENCOUNTER — Ambulatory Visit: Payer: Medicare Other | Admitting: Oncology

## 2022-07-19 ENCOUNTER — Ambulatory Visit: Payer: Medicare Other

## 2022-07-25 ENCOUNTER — Other Ambulatory Visit: Payer: Self-pay

## 2022-07-25 ENCOUNTER — Inpatient Hospital Stay: Payer: Medicare Other | Attending: Oncology

## 2022-07-25 ENCOUNTER — Inpatient Hospital Stay: Payer: Medicare Other

## 2022-07-25 ENCOUNTER — Inpatient Hospital Stay (HOSPITAL_BASED_OUTPATIENT_CLINIC_OR_DEPARTMENT_OTHER): Payer: Medicare Other | Admitting: Oncology

## 2022-07-25 VITALS — BP 121/78 | HR 87 | Temp 97.8°F | Resp 17 | Ht 74.0 in | Wt 238.9 lb

## 2022-07-25 DIAGNOSIS — Z923 Personal history of irradiation: Secondary | ICD-10-CM | POA: Diagnosis not present

## 2022-07-25 DIAGNOSIS — D630 Anemia in neoplastic disease: Secondary | ICD-10-CM | POA: Diagnosis not present

## 2022-07-25 DIAGNOSIS — N1330 Unspecified hydronephrosis: Secondary | ICD-10-CM | POA: Insufficient documentation

## 2022-07-25 DIAGNOSIS — C61 Malignant neoplasm of prostate: Secondary | ICD-10-CM | POA: Insufficient documentation

## 2022-07-25 DIAGNOSIS — C7951 Secondary malignant neoplasm of bone: Secondary | ICD-10-CM | POA: Insufficient documentation

## 2022-07-25 DIAGNOSIS — Z7952 Long term (current) use of systemic steroids: Secondary | ICD-10-CM | POA: Diagnosis not present

## 2022-07-25 DIAGNOSIS — N183 Chronic kidney disease, stage 3 unspecified: Secondary | ICD-10-CM | POA: Diagnosis not present

## 2022-07-25 LAB — CBC WITH DIFFERENTIAL (CANCER CENTER ONLY)
Abs Immature Granulocytes: 0.02 10*3/uL (ref 0.00–0.07)
Basophils Absolute: 0 10*3/uL (ref 0.0–0.1)
Basophils Relative: 1 %
Eosinophils Absolute: 0 10*3/uL (ref 0.0–0.5)
Eosinophils Relative: 1 %
HCT: 39.1 % (ref 39.0–52.0)
Hemoglobin: 13.6 g/dL (ref 13.0–17.0)
Immature Granulocytes: 1 %
Lymphocytes Relative: 21 %
Lymphs Abs: 0.8 10*3/uL (ref 0.7–4.0)
MCH: 32.5 pg (ref 26.0–34.0)
MCHC: 34.8 g/dL (ref 30.0–36.0)
MCV: 93.3 fL (ref 80.0–100.0)
Monocytes Absolute: 0.2 10*3/uL (ref 0.1–1.0)
Monocytes Relative: 5 %
Neutro Abs: 2.7 10*3/uL (ref 1.7–7.7)
Neutrophils Relative %: 71 %
Platelet Count: 188 10*3/uL (ref 150–400)
RBC: 4.19 MIL/uL — ABNORMAL LOW (ref 4.22–5.81)
RDW: 12.5 % (ref 11.5–15.5)
WBC Count: 3.8 10*3/uL — ABNORMAL LOW (ref 4.0–10.5)
nRBC: 0 % (ref 0.0–0.2)

## 2022-07-25 LAB — CMP (CANCER CENTER ONLY)
ALT: 11 U/L (ref 0–44)
AST: 13 U/L — ABNORMAL LOW (ref 15–41)
Albumin: 4 g/dL (ref 3.5–5.0)
Alkaline Phosphatase: 103 U/L (ref 38–126)
Anion gap: 5 (ref 5–15)
BUN: 14 mg/dL (ref 8–23)
CO2: 31 mmol/L (ref 22–32)
Calcium: 8.5 mg/dL — ABNORMAL LOW (ref 8.9–10.3)
Chloride: 106 mmol/L (ref 98–111)
Creatinine: 0.93 mg/dL (ref 0.61–1.24)
GFR, Estimated: >60 mL/min (ref >60)
Glucose, Bld: 140 mg/dL — ABNORMAL HIGH (ref 70–99)
Potassium: 4.1 mmol/L (ref 3.5–5.1)
Sodium: 142 mmol/L (ref 135–145)
Total Bilirubin: 1.7 mg/dL — ABNORMAL HIGH (ref 0.3–1.2)
Total Protein: 6.7 g/dL (ref 6.5–8.1)

## 2022-07-25 MED ORDER — LEUPROLIDE ACETATE (4 MONTH) 30 MG ~~LOC~~ KIT
30.0000 mg | PACK | Freq: Once | SUBCUTANEOUS | Status: AC
  Administered 2022-07-25: 30 mg via SUBCUTANEOUS
  Filled 2022-07-25: qty 30

## 2022-07-25 MED ORDER — OXYCODONE HCL 5 MG PO TABS: 5.0000 mg | ORAL_TABLET | ORAL | 0 refills | Status: DC | PRN

## 2022-07-25 NOTE — Progress Notes (Signed)
Hematology and Oncology Follow Up Visit  Mcadoo Muzquiz [id number] 11-16-1950 71 y.o. 07/25/2022 2:29 PM [name], [name], [name], DO   Principle Diagnosis: 71 year old man with advanced prostate cancer with disease to the bone diagnosed in January 2023.  He has castration-sensitive after presenting with PSA 1460.     Prior Therapy:   He is status post percutaneous nephrostomy tube placement and retroperitoneal lymph node biopsy completed on September 30, 2021.  He is status post Firmagon 240 mg on October 01, 2021.  He is status post radiation therapy to the lumbar spine after receiving 30 Gray in 10 fractions in January 2023.  Current therapy:   Eligard 30 mg every 4 months started on November 01, 2021.  This will be repeated on July 25, 2022.  Zytiga 1000 mg daily with prednisone 5 mg daily started in February 2023.  Interim History: Mr. Brannigan returns today for a follow-up.  Since her last visit, he developed right-sided hip pain in the last 2-1/2 weeks.  His pain is predominantly radiating to the anterior thigh without any neurological deficits.  He used ibuprofen without any significant improvement in his pain.  He continues to ambulate although with some difficulties.     Medications: Updated on review. Current Outpatient Medications  Medication Sig Dispense Refill   abiraterone acetate (ZYTIGA) 250 MG tablet TAKE 4 TABLETS (1000 MG) DAILY. TAKE ON AN EMPTY STOMACH 1 HOUR BEFORE OR 2 HOURS AFTER A MEAL. 120 tablet 0   aspirin EC 81 MG EC tablet Take 1 tablet (81 mg total) by mouth daily.     atorvastatin (LIPITOR) 80 MG tablet Take 1 tablet (80 mg total) by mouth daily. TAKE ONE TABLET BY MOUTH DAILY  **MUST CALL MD FOR APPOINTMENT (Patient taking differently: Take 80 mg by mouth daily.) 90 tablet 3   calcium-vitamin D (OSCAL WITH D) 500-5 MG-MCG tablet Take 1 tablet by mouth 2 (two) times daily. 90 tablet 3   feeding supplement (ENSURE ENLIVE / ENSURE PLUS) LIQD Take 237  mLs by mouth 2 (two) times daily between meals. 237 mL 12   ibuprofen (ADVIL) 600 MG tablet Take 1 tablet (600 mg total) by mouth 3 (three) times daily. 30 tablet 0   metoprolol tartrate (LOPRESSOR) 25 MG tablet Take 1 tablet (25 mg total) by mouth 2 (two) times daily. (Patient taking differently: Take 25 mg by mouth daily.) 180 tablet 3   nitroGLYCERIN (NITROSTAT) 0.4 MG SL tablet DISSOLVE 1 TAB UNDER TONGUE FOR CHEST PAIN - IF PAIN REMAINS AFTER 5 MIN, CALL 911 AND REPEAT DOSE. MAX 3 TABS IN 15 MINUTES (Patient taking differently: 0.4 mg every 5 (five) minutes as needed for chest pain.) 25 tablet 3   ondansetron (ZOFRAN) 4 MG tablet Take 1 tablet (4 mg total) by mouth every 6 (six) hours as needed for nausea. 20 tablet 0   oxyCODONE (OXY IR/ROXICODONE) 5 MG immediate release tablet Take 1 tablet (5 mg total) by mouth every 4 (four) hours as needed for moderate pain. 30 tablet 0   oxyCODONE-acetaminophen (PERCOCET/ROXICET) 5-325 MG tablet Take 1-2 tablets by mouth every 4 (four) hours as needed for moderate pain. 30 tablet 0   predniSONE (DELTASONE) 5 MG tablet Take 1 tablet (5 mg total) by mouth daily with breakfast. 90 tablet 3   senna-docusate (SENOKOT-S) 8.6-50 MG tablet Take 1 tablet by mouth at bedtime as needed for mild constipation. 30 tablet 0   No current facility-administered medications for this visit.  Allergies: No Known Allergies   Physical Exam:     ECOG: 1    General appearance: Comfortable appearing without any discomfort Head: Normocephalic without any trauma Oropharynx: Mucous membranes are moist and pink without any thrush or ulcers. Eyes: Pupils are equal and round reactive to light. Lymph nodes: No cervical, supraclavicular, inguinal or axillary lymphadenopathy.   Heart:regular rate and rhythm.  S1 and S2 without leg edema. Lung: Clear without any rhonchi or wheezes.  No dullness to percussion. Abdomin: Soft, nontender, nondistended with good bowel sounds.   No hepatosplenomegaly. Musculoskeletal: No joint deformity or effusion.  Full range of motion noted. Neurological: No deficits noted on motor, sensory and deep tendon reflex exam. Skin: No petechial rash or dryness.  Appeared moist.        Lab Results: Lab Results  Component Value Date   WBC 3.5 (L) 05/23/2022   HGB 13.3 05/23/2022   HCT 38.9 (L) 05/23/2022   MCV 91.3 05/23/2022   PLT 177 05/23/2022     Chemistry      Component Value Date/Time   NA 142 05/23/2022 0838   K 4.4 05/23/2022 0838   CL 107 05/23/2022 0838   CO2 31 05/23/2022 0838   BUN 16 05/23/2022 0838   CREATININE 0.87 05/23/2022 0838      Component Value Date/Time   CALCIUM 8.9 05/23/2022 0838   ALKPHOS 127 (H) 05/23/2022 0838   AST 15 05/23/2022 0838   ALT 15 05/23/2022 0838   BILITOT 1.4 (H) 05/23/2022 0838        Latest Reference Range & Units 12/03/21 08:50 01/21/22 10:49 03/15/22 08:26 05/23/22 08:38  Prostate Specific Ag, Serum 0.0 - 4.0 ng/mL 10.0 (H) 3.6 2.4 2.1  (H): Data is abnormally high    Impression and Plan:  71 year old with:  1.  Advanced prostate cancer with disease to the bone and lymphadenopathy diagnosed in January 2023.  He has castration-sensitive disease at this time.   He continues to tolerate Zytiga with excellent PSA response currently at 2.1.  Risks and benefits of continuing this treatment were discussed.  Alternative treatment options such as Taxotere chemotherapy among others will be deferred unless he has progression of disease.  He is agreeable to continue.   2.  Anemia: Related to prostate cancer and treatment.  His hemoglobin is normalized.   3.  Hip pain: Likely related to advanced malignancy.  We will obtain MRI of his right hip and he will use oxycodone for pain at this time.  Radiation therapy may be required if his pain is related to advanced prostate cancer.  4.  Androgen deprivation therapy: He is currently on Eligard which will be given today and  repeated in 4 months.  Complications that include weight gain and hot flashes were discussed.   5.  Hydronephrosis: His kidney function remains normal I will continue to monitor and subsequent imaging.   6.  Prognosis and goals of care and disposition: Therapy remains palliative though aggressive measures are warranted at this time.  7.  Bone directed therapy: He will receive Xgeva today and repeated in 2 months.  Complication clinic osteonecrosis of the jaw and hypocalcemia were reiterated.   8.  Follow-up: In 2 months for a follow-up.   30  minutes were spent on this visit.  The time was dedicated to reviewing laboratory data, disease status update and outlining future plan of care discussion.  Zola Button, MD 11/2/20232:29 PM

## 2022-07-25 NOTE — Progress Notes (Signed)
Per Dr. [name], Eligard only today, no xgeva due to calcium of 8.5.

## 2022-07-26 LAB — PROSTATE-SPECIFIC AG, SERUM (LABCORP): Prostate Specific Ag, Serum: 2.7 ng/mL (ref 0.0–4.0)

## 2022-07-29 ENCOUNTER — Other Ambulatory Visit: Payer: Self-pay | Admitting: Oncology

## 2022-07-29 DIAGNOSIS — C61 Malignant neoplasm of prostate: Secondary | ICD-10-CM

## 2022-08-01 ENCOUNTER — Telehealth: Payer: Self-pay | Admitting: *Deleted

## 2022-08-01 NOTE — Telephone Encounter (Signed)
PC to patient, no answer, left VM - informed him of Dr Hazeline Junker message below.  Instructed patient to call this office with any questions/concerns, [phone].

## 2022-08-01 NOTE — Telephone Encounter (Signed)
-----   Message from Patton Salles, RN sent at 08/01/2022  8:24 AM EST ----- I have been off...sorry ----- Message ----- From: [name], MD Sent: 07/26/2022   2:47 PM EST To: Patton Salles, RN  Please let him know his PSA is slightly up but still under control.

## 2022-08-03 ENCOUNTER — Ambulatory Visit (HOSPITAL_COMMUNITY)
Admission: RE | Admit: 2022-08-03 | Discharge: 2022-08-03 | Disposition: A | Discharge status: Home / Self Care | Payer: Medicare Other | Source: Ambulatory Visit | Attending: Oncology | Admitting: Oncology

## 2022-08-03 DIAGNOSIS — C61 Malignant neoplasm of prostate: Secondary | ICD-10-CM | POA: Diagnosis not present

## 2022-08-03 MED ORDER — GADOBUTROL 1 MMOL/ML IV SOLN
10.0000 mL | Freq: Once | INTRAVENOUS | Status: AC | PRN
  Administered 2022-08-03: 10 mL via INTRAVENOUS

## 2022-08-06 ENCOUNTER — Other Ambulatory Visit: Payer: Self-pay | Admitting: Oncology

## 2022-08-06 ENCOUNTER — Telehealth: Payer: Self-pay | Admitting: Radiation Oncology

## 2022-08-06 DIAGNOSIS — C61 Malignant neoplasm of prostate: Secondary | ICD-10-CM

## 2022-08-06 NOTE — Telephone Encounter (Signed)
Left message to call back to schedule appointment per referral.

## 2022-08-06 NOTE — Progress Notes (Signed)
The results of the MRI were personally reviewed and discussed with the patient.  No acute pathology noted at this time but he continues to have right hip discomfort that is interfering with his activities of daily living.  I have recommended referral to radiation oncology for palliative radiation.  He is agreeable and will make the appropriate referral.

## 2022-08-19 NOTE — Progress Notes (Signed)
Radiation Oncology         [phone] ________________________________  Outpatient Re-consultation - Conducted via telephone at patient request.  I spoke with the patient to conduct this consult visit via telephone. The patient was notified in advance and was offered an in person or telemedicine meeting to allow for face to face communication but instead preferred to proceed with a telephone consult.    ________________________________  Name: Erik Page        MRN: [id number]  Date of Service: 08/22/2022 DOB: March 30, 1951  [id number], [name], DO  [name], Mathis Dad, MD     REFERRING PHYSICIAN: [name], MD   DIAGNOSIS: The encounter diagnosis was Prostate cancer metastatic to multiple sites [facility]).   HISTORY OF PRESENT ILLNESS: Erik Page is a 71 y.o. male seen at the request of Dr. [name] with a history of Stage IV prostate cancer. He is known to our service and received a palliative course of radiotherapy to the L3 spine which he completed in January 2023. Since that time, he Remained on Eligard Zytiga and prednisone.  He developed right-sided hip pain in October 2023 and mention this to Dr. [name].  He underwent an MRI of the pelvis on 08/03/2022 which showed interval improvement in retroperitoneal and pelvic lymphadenopathy but widespread osseous disease similar to previous and well no pathologic fractures were noted there was asymmetry between the right iliac wing and iliacus muscle with the symptoms of right hip pain and these findings by MRI, the patient was contacted by phone to consider additional palliative radiotherapy.    PREVIOUS RADIATION THERAPY:     Radiation Treatment Dates:  10/04/2021 through 10/17/2021 Site Technique Total Dose (Gy) Dose per Fx (Gy) Completed Fx Beam Energies  Lumbar Spine: Spine_L3 Complex 30/30 3 10/10 15X     PAST MEDICAL HISTORY:  Past Medical History:  Diagnosis Date   HTN (hypertension)    Myocardial infarction [facility])        PAST  SURGICAL HISTORY: Past Surgical History:  Procedure Laterality Date   CARDIAC CATHETERIZATION N/A 08/15/2015   Procedure: Left Heart Cath and Coronary Angiography;  Surgeon: Leonie Man, MD;  Location: [place] CV LAB;  Service: Cardiovascular;  Laterality: N/A;   CARDIAC CATHETERIZATION N/A 08/15/2015   Procedure: Left Heart Cath and Coronary Angiography;  Surgeon: Leonie Man, MD;  Location: [place] CV LAB;  Service: Cardiovascular;  Laterality: N/A;   CARDIAC CATHETERIZATION N/A 08/15/2015   Procedure: Coronary Stent Intervention;  Surgeon: Leonie Man, MD;  Location: [place] CV LAB;  Service: Cardiovascular;  Laterality: N/A;   IR CONVERT RIGHT NEPHROSTOMY TO NEPHROURETERAL CATH  11/12/2021   IR EXT NEPHROURETERAL CATH EXCHANGE  01/07/2022   IR NEPHROSTOMY EXCHANGE RIGHT  02/20/2022   IR NEPHROSTOMY PLACEMENT RIGHT  10/01/2021   TONSILLECTOMY       FAMILY HISTORY:  Family History  Problem Relation Age of Onset   Hypertension Other      SOCIAL HISTORY:  reports that he has never smoked. He has never used smokeless tobacco. He reports that he does not drink alcohol and does not use drugs.   ALLERGIES: Patient has no known allergies.   MEDICATIONS:  Current Outpatient Medications  Medication Sig Dispense Refill   abiraterone acetate (ZYTIGA) 250 MG tablet TAKE 4 TABLETS (1000 MG) DAILY. TAKE ON AN EMPTY STOMACH 1 HOUR BEFORE OR 2 HOURS AFTER A MEAL. 120 tablet 0   aspirin EC 81 MG EC tablet Take 1 tablet (81  mg total) by mouth daily.     atorvastatin (LIPITOR) 80 MG tablet Take 1 tablet (80 mg total) by mouth daily. TAKE ONE TABLET BY MOUTH DAILY  **MUST CALL MD FOR APPOINTMENT (Patient taking differently: Take 80 mg by mouth daily.) 90 tablet 3   calcium-vitamin D (OSCAL WITH D) 500-5 MG-MCG tablet Take 1 tablet by mouth 2 (two) times daily. 90 tablet 3   feeding supplement (ENSURE ENLIVE / ENSURE PLUS) LIQD Take 237 mLs by mouth 2 (two) times daily between  meals. 237 mL 12   ibuprofen (ADVIL) 600 MG tablet Take 1 tablet (600 mg total) by mouth 3 (three) times daily. 30 tablet 0   metoprolol tartrate (LOPRESSOR) 25 MG tablet Take 1 tablet (25 mg total) by mouth 2 (two) times daily. (Patient taking differently: Take 25 mg by mouth daily.) 180 tablet 3   nitroGLYCERIN (NITROSTAT) 0.4 MG SL tablet DISSOLVE 1 TAB UNDER TONGUE FOR CHEST PAIN - IF PAIN REMAINS AFTER 5 MIN, CALL 911 AND REPEAT DOSE. MAX 3 TABS IN 15 MINUTES (Patient taking differently: 0.4 mg every 5 (five) minutes as needed for chest pain.) 25 tablet 3   ondansetron (ZOFRAN) 4 MG tablet Take 1 tablet (4 mg total) by mouth every 6 (six) hours as needed for nausea. 20 tablet 0   oxyCODONE (OXY IR/ROXICODONE) 5 MG immediate release tablet Take 1 tablet (5 mg total) by mouth every 4 (four) hours as needed for moderate pain. 30 tablet 0   oxyCODONE-acetaminophen (PERCOCET/ROXICET) 5-325 MG tablet Take 1-2 tablets by mouth every 4 (four) hours as needed for moderate pain. 30 tablet 0   predniSONE (DELTASONE) 5 MG tablet Take 1 tablet (5 mg total) by mouth daily with breakfast. 90 tablet 3   senna-docusate (SENOKOT-S) 8.6-50 MG tablet Take 1 tablet by mouth at bedtime as needed for mild constipation. 30 tablet 0   No current facility-administered medications for this visit.     REVIEW OF SYSTEMS: On review of systems, the patient reports that *** is doing well overall. *** denies any chest pain, shortness of breath, cough, fevers, chills, night sweats, unintended weight changes. *** denies any bowel or bladder disturbances, and denies abdominal pain, nausea or vomiting. *** denies any new musculoskeletal or joint aches or pains. A complete review of systems is obtained and is otherwise negative.     PHYSICAL EXAM:  Wt Readings from Last 3 Encounters:  07/25/22 238 lb 14.4 oz (108.4 kg)  05/23/22 232 lb 9.6 oz (105.5 kg)  03/15/22 225 lb 12.8 oz (102.4 kg)   Temp Readings from Last 3  Encounters:  07/25/22 97.8 F (36.6 C) (Temporal)  05/23/22 98.1 F (36.7 C) (Oral)  03/15/22 98.4 F (36.9 C) (Oral)   BP Readings from Last 3 Encounters:  07/25/22 121/78  05/23/22 133/81  03/15/22 128/68   Pulse Readings from Last 3 Encounters:  07/25/22 87  05/23/22 79  03/15/22 64    /10  In general this is a well appearing *** in no acute distress. ***'s alert and oriented x4 and appropriate throughout the examination. Cardiopulmonary assessment is negative for acute distress and *** exhibits normal effort.     ECOG = ***  0 - Asymptomatic (Fully active, able to carry on all predisease activities without restriction)  1 - Symptomatic but completely ambulatory (Restricted in physically strenuous activity but ambulatory and able to carry out work of a light or sedentary nature. For example, light housework, office work)  2 - Symptomatic, <  50% in bed during the day (Ambulatory and capable of all self care but unable to carry out any work activities. Up and about more than 50% of waking hours)  3 - Symptomatic, >50% in bed, but not bedbound (Capable of only limited self-care, confined to bed or chair 50% or more of waking hours)  4 - Bedbound (Completely disabled. Cannot carry on any self-care. Totally confined to bed or chair)  5 - Death   Eustace Pen MM, Creech RH, Tormey DC, et al. [phone]). "Toxicity and response criteria of the [facility]". [place] Oncol. 5 (6): 649-55    LABORATORY DATA:  Lab Results  Component Value Date   WBC 3.8 (L) 07/25/2022   HGB 13.6 07/25/2022   HCT 39.1 07/25/2022   MCV 93.3 07/25/2022   PLT 188 07/25/2022   Lab Results  Component Value Date   NA 142 07/25/2022   K 4.1 07/25/2022   CL 106 07/25/2022   CO2 31 07/25/2022   Lab Results  Component Value Date   ALT 11 07/25/2022   AST 13 (L) 07/25/2022   ALKPHOS 103 07/25/2022   BILITOT 1.7 (H) 07/25/2022      RADIOGRAPHY: MR Pelvis W Wo  Contrast  Result Date: 08/06/2022 CLINICAL DATA:  Prostate cancer, assess treatment response. Right hip pain. EXAM: MRI PELVIS WITHOUT AND WITH CONTRAST TECHNIQUE: Multiplanar multisequence MR imaging of the pelvis was performed both before and after administration of intravenous contrast. CONTRAST:  93m GADAVIST GADOBUTROL 1 MMOL/ML IV SOLN COMPARISON:  MRI lumbar spine 10/01/2021. Abdominopelvic CT 09/28/2021. FINDINGS: Urinary Tract: The visualized distal ureters appear normal in caliber. There is mild nonspecific bladder wall thickening. Bowel: No bowel wall thickening, distention or surrounding inflammation identified within the pelvis. Vascular/Lymphatic: Previously demonstrated retroperitoneal and pelvic lymphadenopathy has significantly improved in the interval. A small residual right pelvic sidewall node measures 8 mm short axis on image 20/7 (previously 22 mm). No progressive adenopathy identified. No significant vascular findings. Reproductive: Previously demonstrated prostatomegaly has improved, and no definite residual infiltration in the adjacent soft tissues identified. Other: No pelvic ascites or abnormal peritoneal enhancement identified. Musculoskeletal: Widespread osseous metastatic disease is again noted, similar to previous studies. There is involvement of the lower lumbar spine, bony pelvis and both proximal femurs. No pathologic fractures are identified. There is mild biforaminal narrowing at L5-S1 which appears unchanged. No significant hip joint effusion. There is mild asymmetric T2 hyperintensity between the right iliac wing and iliacus muscle without focal fluid collection, abnormal enhancement or focal mass lesion. The adjacent sacroiliac joint appears unremarkable. The pelvic musculature otherwise appears unremarkable. IMPRESSION: 1. Interval improvement in retroperitoneal and pelvic lymphadenopathy compared with previous studies consistent with response to therapy. 2. Widespread  osseous metastatic disease, similar to previous studies. No pathologic fractures identified. 3. Mild asymmetric T2 hyperintensity between the right iliac wing and iliacus muscle without focal fluid collection, abnormal enhancement or focal mass lesion. 4. Mild nonspecific bladder wall thickening. No residual ureteral dilatation evident. Electronically Signed   By: WRichardean SaleM.D.   On: 08/06/2022 09:13       IMPRESSION/PLAN: 1. 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 MLisbeth Renshawdiscusses the pathology findings and reviews the nature of ***   We discussed the risks, benefits, short, and long term effects of radiotherapy, as well as the curative intent, and the patient is interested in proceeding. Dr. MLisbeth Renshawdiscusses the delivery and logistics of radiotherapy and anticipates a course of ***  weeks of radiotherapy.    This encounter was conducted via telephone.  The patient has provided two factor identification and has given verbal consent for this type of encounter and has been advised to only accept a meeting of this type in a secure network environment. The time spent during this encounter was *** minutes including preparation, discussion, and coordination of the patient's care. The attendants for this meeting include  Dr. Lisbeth Renshaw, Hayden Pedro  and Toney Rakes.  During the encounter,  Dr. Lisbeth Renshaw, and Hayden Pedro were located at [facility] Radiation Oncology Department.  Erik Page was located at home.   The above documentation reflects my direct findings during this shared patient visit. Please see the separate note by Dr. Lisbeth Renshaw on this date for the remainder of the patient's plan of care.    Carola Rhine, [facility]   **Disclaimer: This note was dictated with voice recognition software. Similar sounding words can inadvertently be transcribed and this note may contain transcription errors which may not have been corrected upon  publication of note.**

## 2022-08-21 ENCOUNTER — Encounter: Payer: Self-pay | Admitting: Radiation Oncology

## 2022-08-21 NOTE — Progress Notes (Signed)
Telephone reconsult for a 71 yr old male w/  Prostate cancer metastatic to multiple sites [facility]). I verified patient's identity and began nursing interview.  Patient reports RT pelvis pain, that's radiates down the RT leg 10/10, and is affecting patient's ambulation. Pain is managed w/ managed Oxycodone and Ibuprofen.  Meaningful use complete. I-PSS score of 2-mild. No urinary management medications. Urology appt- None currently [facility]- Dr. Louis Meckel  Patient aware of his 8:30am-08/22/2022 telephone reconsult w/ Shona Simpson PA-C. I left my extension [phone] in case patient needs anything.   This concludes the interview.   Leandra Kern, LPN

## 2022-08-22 ENCOUNTER — Telehealth: Payer: Self-pay | Admitting: *Deleted

## 2022-08-22 ENCOUNTER — Ambulatory Visit
Admission: RE | Admit: 2022-08-22 | Discharge: 2022-08-22 | Disposition: A | Discharge status: Home / Self Care | Payer: Medicare Other | Source: Ambulatory Visit | Attending: Radiation Oncology | Admitting: Radiation Oncology

## 2022-08-22 ENCOUNTER — Other Ambulatory Visit: Payer: Self-pay | Admitting: Radiation Oncology

## 2022-08-22 DIAGNOSIS — C61 Malignant neoplasm of prostate: Secondary | ICD-10-CM

## 2022-08-22 MED ORDER — OXYCODONE HCL 10 MG PO TABS: 10.0000 mg | ORAL_TABLET | ORAL | 0 refills | Status: DC | PRN

## 2022-08-22 MED ORDER — OXYCODONE HCL 5 MG PO TABS: 10.0000 mg | ORAL_TABLET | ORAL | 0 refills | Status: DC | PRN

## 2022-08-22 NOTE — Telephone Encounter (Signed)
Called patient to inform of PSMA Scan on 08-28-22- arrival time- 1 pm @ [facility], no restrictions to test, spoke with patient and he is aware of this scan and the instructions

## 2022-08-24 ENCOUNTER — Emergency Department (HOSPITAL_COMMUNITY)
Admission: EM | Admit: 2022-08-24 | Discharge: 2022-08-24 | Discharge status: Against Medical Advice | Payer: Medicare Other | Attending: Emergency Medicine | Admitting: Emergency Medicine

## 2022-08-24 ENCOUNTER — Other Ambulatory Visit: Payer: Self-pay

## 2022-08-24 ENCOUNTER — Emergency Department (HOSPITAL_COMMUNITY): Payer: Medicare Other

## 2022-08-24 ENCOUNTER — Encounter (HOSPITAL_COMMUNITY): Payer: Self-pay

## 2022-08-24 DIAGNOSIS — Z8546 Personal history of malignant neoplasm of prostate: Secondary | ICD-10-CM | POA: Diagnosis not present

## 2022-08-24 DIAGNOSIS — R102 Pelvic and perineal pain: Secondary | ICD-10-CM | POA: Insufficient documentation

## 2022-08-24 DIAGNOSIS — Z5321 Procedure and treatment not carried out due to patient leaving prior to being seen by health care provider: Secondary | ICD-10-CM | POA: Insufficient documentation

## 2022-08-24 LAB — CBC WITH DIFFERENTIAL/PLATELET
Abs Immature Granulocytes: 0.03 10*3/uL (ref 0.00–0.07)
Basophils Absolute: 0 10*3/uL (ref 0.0–0.1)
Basophils Relative: 0 %
Eosinophils Absolute: 0.1 10*3/uL (ref 0.0–0.5)
Eosinophils Relative: 1 %
HCT: 42.9 % (ref 39.0–52.0)
Hemoglobin: 14.7 g/dL (ref 13.0–17.0)
Immature Granulocytes: 1 %
Lymphocytes Relative: 26 %
Lymphs Abs: 1.3 10*3/uL (ref 0.7–4.0)
MCH: 31.8 pg (ref 26.0–34.0)
MCHC: 34.3 g/dL (ref 30.0–36.0)
MCV: 92.9 fL (ref 80.0–100.0)
Monocytes Absolute: 0.3 10*3/uL (ref 0.1–1.0)
Monocytes Relative: 6 %
Neutro Abs: 3.2 10*3/uL (ref 1.7–7.7)
Neutrophils Relative %: 66 %
Platelets: 249 10*3/uL (ref 150–400)
RBC: 4.62 MIL/uL (ref 4.22–5.81)
RDW: 12.6 % (ref 11.5–15.5)
WBC: 4.8 10*3/uL (ref 4.0–10.5)
nRBC: 0 % (ref 0.0–0.2)

## 2022-08-24 LAB — COMPREHENSIVE METABOLIC PANEL
ALT: 13 U/L (ref 0–44)
AST: 14 U/L — ABNORMAL LOW (ref 15–41)
Albumin: 3.7 g/dL (ref 3.5–5.0)
Alkaline Phosphatase: 105 U/L (ref 38–126)
Anion gap: 9 (ref 5–15)
BUN: 16 mg/dL (ref 8–23)
CO2: 25 mmol/L (ref 22–32)
Calcium: 8.7 mg/dL — ABNORMAL LOW (ref 8.9–10.3)
Chloride: 108 mmol/L (ref 98–111)
Creatinine, Ser: 0.97 mg/dL (ref 0.61–1.24)
GFR, Estimated: >60 mL/min (ref >60)
Glucose, Bld: 144 mg/dL — ABNORMAL HIGH (ref 70–99)
Potassium: 3.3 mmol/L — ABNORMAL LOW (ref 3.5–5.1)
Sodium: 142 mmol/L (ref 135–145)
Total Bilirubin: 2.4 mg/dL — ABNORMAL HIGH (ref 0.3–1.2)
Total Protein: 6.9 g/dL (ref 6.5–8.1)

## 2022-08-24 MED ORDER — PREDNISONE 5 MG PO TABS
5.0000 mg | ORAL_TABLET | Freq: Once | ORAL | Status: AC
  Administered 2022-08-24: 5 mg via ORAL
  Filled 2022-08-24: qty 1

## 2022-08-24 MED ORDER — ONDANSETRON 4 MG PO TBDP
4.0000 mg | ORAL_TABLET | Freq: Once | ORAL | Status: AC
  Administered 2022-08-24: 4 mg via ORAL
  Filled 2022-08-24: qty 1

## 2022-08-24 MED ORDER — OXYCODONE-ACETAMINOPHEN 5-325 MG PO TABS
2.0000 | ORAL_TABLET | Freq: Once | ORAL | Status: AC
  Administered 2022-08-24: 2 via ORAL
  Filled 2022-08-24: qty 2

## 2022-08-24 NOTE — ED Triage Notes (Signed)
C/o right pelvic pain radiating to right leg x 3days. Relief when laying straight Oxycodone, hydrocodone, ibuprofen w/o relief.  Hx stage IV prostate cancer with bony mets

## 2022-08-24 NOTE — ED Provider Triage Note (Signed)
Emergency Medicine Provider Triage Evaluation Note  Erik Page , a 71 y.o. male  was evaluated in triage.  Pt complains of right sided pelvic pain that radiates down into his right leg.  Denies numbness or tingling, changes in urinary function, changes in bowel habits, fever.  Patient has diagnosis of prostate cancer, just finished radiation, and is now on hormone treatment.  He had recent MRI that shows metastatic disease to the bones as well as a new suspicious lesion in the pelvis.  He has not been able to ambulate normally and has had to use a cane, no falls.   Review of Systems  Positive: See above Negative: See above  Physical Exam  BP (!) 185/113   Pulse 92   Temp 98.4 F (36.9 C) (Oral)   Resp 18   Ht '6\' 2"'$  (1.88 m)   Wt 108 kg   SpO2 100%   BMI 30.57 kg/m  Gen:   Awake, no distress   Resp:  Normal effort  MSK:   Moves extremities without difficulty  Other:  Patient is unable to ambulate  Medical Decision Making  Medically screening exam initiated at 11:33 AM.  Appropriate orders placed.  Erik Page was informed that the remainder of the evaluation will be completed by another provider, this initial triage assessment does not replace that evaluation, and the importance of remaining in the ED until their evaluation is complete.     Theressa Stamps R, [place] 08/24/22 1135

## 2022-08-26 ENCOUNTER — Telehealth: Payer: Self-pay

## 2022-08-26 ENCOUNTER — Other Ambulatory Visit: Payer: Self-pay | Admitting: Oncology

## 2022-08-26 DIAGNOSIS — C61 Malignant neoplasm of prostate: Secondary | ICD-10-CM

## 2022-08-26 NOTE — Telephone Encounter (Signed)
Per Shona Simpson PA-C. Patient is to increase his Oxycodone '5mg'$  2 tabs po q4s to '15mg'$  3 tabs po Q4s to help control RT hip/leg pain.  Patient verbalized understanding. This concludes the instructions.   Leandra Kern, LPN

## 2022-08-26 NOTE — Telephone Encounter (Signed)
Patient want's to speak w/ Shona Simpson PA-C about his most recent CT of RT hip from 08/24/2022 (ED visit). Patient is now unable to walk to bathroom without assistance/resting and pain medications are not working. RT hip pain is a 9/10. Patient is currently taking Oxycodone '5mg'$  2po Q4h, that is not working. Also taking Morphine, given by Dr. Herold Harms in [place]. Morphine does provide some mild, temporary relief 7/10. Patient is reduced to lying on his back the majority of the day. Patient has an appt for a PET scan on 08/28/2022 and thinks his pain will prevent him from making this appt.  I will deliver this information to patient's care team here at [facility].  This concludes the conversation.   Leandra Kern, LPN

## 2022-08-28 ENCOUNTER — Encounter (HOSPITAL_COMMUNITY)
Admission: RE | Admit: 2022-08-28 | Discharge: 2022-08-28 | Disposition: A | Discharge status: Home / Self Care | Payer: Medicare Other | Source: Ambulatory Visit | Attending: Radiation Oncology | Admitting: Radiation Oncology

## 2022-08-28 ENCOUNTER — Telehealth: Payer: Self-pay | Admitting: Radiation Oncology

## 2022-08-28 DIAGNOSIS — C61 Malignant neoplasm of prostate: Secondary | ICD-10-CM

## 2022-08-28 DIAGNOSIS — M25551 Pain in right hip: Secondary | ICD-10-CM

## 2022-08-28 MED ORDER — PIFLIFOLASTAT F 18 (PYLARIFY) INJECTION
9.0000 | Freq: Once | INTRAVENOUS | Status: AC
  Administered 2022-08-28: 9 via INTRAVENOUS

## 2022-08-28 NOTE — Telephone Encounter (Signed)
I called and spoke with the patient's wife regarding his PSMA PET scan. It does not appear that he has a focal site of disease in the right hip or pelvis that would be the source of his severe pain. He does however have widespread disease throughout the skeleton, however we discussed that without a focal area to offer treatment to with radiation, the benefits may be minimal for reducing pain which is his primary goal, and also pose risks of receiving radiation. She is glad to hear that radiation is not being recommended, but we will set up an urgent referral to orthopedics for assessment. She is in agreement with this plan, for now he will continue taking oxycodone as previously outlined but subsequent medication will be from orthopedics after he's seen.

## 2022-08-29 ENCOUNTER — Telehealth: Payer: Self-pay | Admitting: *Deleted

## 2022-08-29 NOTE — Telephone Encounter (Signed)
Called patient to inform of appt. with Dr.Michael Orland Penman on 09-06-22 - arrival time- 2 pm, address - [place]. [address] floor, [address], ph. number [phone], spoke with patient and he is aware of this appt.

## 2022-08-30 ENCOUNTER — Telehealth: Payer: Self-pay | Admitting: *Deleted

## 2022-08-30 NOTE — Telephone Encounter (Signed)
Returned PC to patient, informed him of Dr Hazeline Junker message.  Instructed patient to take his percocet every 6 hours on a regular basis until he has pain relief.  Also informed patient narcotic use can cause constipation & he may take miralax for this.  Patient verbalizes understanding, is aware of his orthopedic referral, has an appointment on 09/06/22.

## 2022-08-30 NOTE — Telephone Encounter (Signed)
-----   Message from [name], MD sent at 08/30/2022  3:09 PM EST ----- He needs to take his pain medication. The scans did not show cancer to explain his pain. He has be referred to orthopedics to evaluate.  ----- Message ----- From: Rolene Course, RN Sent: 08/30/2022   3:06 PM EST To: [name], MD  Mr Morain called & said he is in horrific pain.  His pain is in his pelvis & is now radiating into his R thigh.  He went to the ED on 12/2 & had a CT, also had a PET on 12/6.  Someone (not sure who) told him his scans are normal but he said his pain is out of control.  He states that percocet helps some, he hasn't been taking it on a regular basis because his wife gets upset about him taking narcotics.  He is asking if you have seen his scans & what you recommend.

## 2022-09-02 ENCOUNTER — Other Ambulatory Visit (HOSPITAL_COMMUNITY): Payer: Medicare Other

## 2022-09-24 ENCOUNTER — Telehealth: Payer: Self-pay | Admitting: *Deleted

## 2022-09-24 ENCOUNTER — Other Ambulatory Visit: Payer: Self-pay | Admitting: *Deleted

## 2022-09-24 ENCOUNTER — Other Ambulatory Visit: Payer: Self-pay | Admitting: Oncology

## 2022-09-24 DIAGNOSIS — M79604 Pain in right leg: Secondary | ICD-10-CM

## 2022-09-24 DIAGNOSIS — C61 Malignant neoplasm of prostate: Secondary | ICD-10-CM

## 2022-09-24 NOTE — Telephone Encounter (Signed)
Patient called - he's coming tomorrow 1/3 for labs followed by appt with Dr. [name] and eligard injection.  He said has seen orthopedist and will talk with Dr. [name] about the appt when he sees him tomorrow. He also said he is still having severe pain in his leg and wondered if it might be gout. He asked if Uric Acid level could be added to his labs tomorrow.  Dr. [name] informed of patient concern and request. Uric acid level ordered per Dr. Hazeline Junker order.  Contacted patient to inform that uric acid level added to tomorrow's labs. Patient thanked caller for information.

## 2022-09-25 ENCOUNTER — Inpatient Hospital Stay: Payer: Medicare Other | Attending: Oncology

## 2022-09-25 ENCOUNTER — Inpatient Hospital Stay: Payer: Medicare Other

## 2022-09-25 ENCOUNTER — Other Ambulatory Visit: Payer: Self-pay

## 2022-09-25 ENCOUNTER — Inpatient Hospital Stay (HOSPITAL_BASED_OUTPATIENT_CLINIC_OR_DEPARTMENT_OTHER): Payer: Medicare Other | Admitting: Oncology

## 2022-09-25 VITALS — BP 131/79 | HR 77 | Temp 97.8°F | Resp 18 | Ht 74.0 in | Wt 237.5 lb

## 2022-09-25 DIAGNOSIS — Z7952 Long term (current) use of systemic steroids: Secondary | ICD-10-CM | POA: Diagnosis not present

## 2022-09-25 DIAGNOSIS — C61 Malignant neoplasm of prostate: Secondary | ICD-10-CM | POA: Diagnosis present

## 2022-09-25 DIAGNOSIS — N1330 Unspecified hydronephrosis: Secondary | ICD-10-CM | POA: Insufficient documentation

## 2022-09-25 DIAGNOSIS — C7951 Secondary malignant neoplasm of bone: Secondary | ICD-10-CM | POA: Insufficient documentation

## 2022-09-25 DIAGNOSIS — E2740 Unspecified adrenocortical insufficiency: Secondary | ICD-10-CM | POA: Diagnosis not present

## 2022-09-25 DIAGNOSIS — Z923 Personal history of irradiation: Secondary | ICD-10-CM | POA: Diagnosis not present

## 2022-09-25 DIAGNOSIS — Z862 Personal history of diseases of the blood and blood-forming organs and certain disorders involving the immune mechanism: Secondary | ICD-10-CM | POA: Insufficient documentation

## 2022-09-25 DIAGNOSIS — M25551 Pain in right hip: Secondary | ICD-10-CM | POA: Diagnosis not present

## 2022-09-25 LAB — CMP (CANCER CENTER ONLY)
ALT: 10 U/L (ref 0–44)
AST: 11 U/L — ABNORMAL LOW (ref 15–41)
Albumin: 3.7 g/dL (ref 3.5–5.0)
Alkaline Phosphatase: 89 U/L (ref 38–126)
Anion gap: 5 (ref 5–15)
BUN: 18 mg/dL (ref 8–23)
CO2: 34 mmol/L — ABNORMAL HIGH (ref 22–32)
Calcium: 9.5 mg/dL (ref 8.9–10.3)
Chloride: 103 mmol/L (ref 98–111)
Creatinine: 0.81 mg/dL (ref 0.61–1.24)
GFR, Estimated: >60 mL/min (ref >60)
Glucose, Bld: 117 mg/dL — ABNORMAL HIGH (ref 70–99)
Potassium: 3.5 mmol/L (ref 3.5–5.1)
Sodium: 142 mmol/L (ref 135–145)
Total Bilirubin: 1.8 mg/dL — ABNORMAL HIGH (ref 0.3–1.2)
Total Protein: 5.7 g/dL — ABNORMAL LOW (ref 6.5–8.1)

## 2022-09-25 LAB — CBC WITH DIFFERENTIAL (CANCER CENTER ONLY)
Abs Immature Granulocytes: 0.01 10*3/uL (ref 0.00–0.07)
Basophils Absolute: 0 10*3/uL (ref 0.0–0.1)
Basophils Relative: 1 %
Eosinophils Absolute: 0.1 10*3/uL (ref 0.0–0.5)
Eosinophils Relative: 2 %
HCT: 36.3 % — ABNORMAL LOW (ref 39.0–52.0)
Hemoglobin: 12.7 g/dL — ABNORMAL LOW (ref 13.0–17.0)
Immature Granulocytes: 0 %
Lymphocytes Relative: 24 %
Lymphs Abs: 0.9 10*3/uL (ref 0.7–4.0)
MCH: 32.2 pg (ref 26.0–34.0)
MCHC: 35 g/dL (ref 30.0–36.0)
MCV: 92.1 fL (ref 80.0–100.0)
Monocytes Absolute: 0.3 10*3/uL (ref 0.1–1.0)
Monocytes Relative: 7 %
Neutro Abs: 2.5 10*3/uL (ref 1.7–7.7)
Neutrophils Relative %: 66 %
Platelet Count: 175 10*3/uL (ref 150–400)
RBC: 3.94 MIL/uL — ABNORMAL LOW (ref 4.22–5.81)
RDW: 12.4 % (ref 11.5–15.5)
WBC Count: 3.8 10*3/uL — ABNORMAL LOW (ref 4.0–10.5)
nRBC: 0 % (ref 0.0–0.2)

## 2022-09-25 MED ORDER — DENOSUMAB 120 MG/1.7ML ~~LOC~~ SOLN
120.0000 mg | Freq: Once | SUBCUTANEOUS | Status: AC
  Administered 2022-09-25: 120 mg via SUBCUTANEOUS
  Filled 2022-09-25: qty 1.7

## 2022-09-25 NOTE — Progress Notes (Signed)
Hematology and Oncology Follow Up Visit  Aran Menning [id number] August 29, 1951 72 y.o. 09/25/2022 12:28 PM [name], [name], [name], DO   Principle Diagnosis: 72 year old man with castration-sensitive advanced prostate cancer with disease to the bone diagnosed in January 2023.  He presented with PSA 1460.     Prior Therapy:   He is status post percutaneous nephrostomy tube placement and retroperitoneal lymph node biopsy completed on September 30, 2021.  He is status post Firmagon 240 mg on October 01, 2021.  He is status post radiation therapy to the lumbar spine after receiving 30 Gray in 10 fractions in January 2023.  Current therapy:   Eligard 30 mg every 4 months started on November 01, 2021.  This will be repeated in March 2024.  Zytiga 1000 mg daily with prednisone 5 mg daily started in February 2023.  Interim History: Mr. Covello presents today for a return evaluation.  Since last visit, he reports no major changes in his health.  He continues to have pain in the right hip as well as the anterior part of his leg.  His pain is more manageable now compared to last month.  He does take Percocet once a day in the morning which helps with his ability to function.  He is able to ambulate with the help of a cane and was able to drive today.  He denies any complications related to Zytiga at this time.  He denies any nausea vomiting or abdominal pain.  He did have an orthopedic evaluation although no clear-cut intervention was recommended.     Medications: Updated on review. Current Outpatient Medications  Medication Sig Dispense Refill   abiraterone acetate (ZYTIGA) 250 MG tablet TAKE 4 TABLETS (1000 MG) DAILY. TAKE ON AN EMPTY STOMACH 1 HOUR BEFORE OR 2 HOURS AFTER A MEAL. 120 tablet 0   aspirin EC 81 MG EC tablet Take 1 tablet (81 mg total) by mouth daily.     atorvastatin (LIPITOR) 80 MG tablet Take 1 tablet (80 mg total) by mouth daily. TAKE ONE TABLET BY MOUTH DAILY  **MUST CALL MD  FOR APPOINTMENT (Patient taking differently: Take 80 mg by mouth daily.) 90 tablet 3   calcium-vitamin D (OSCAL WITH D) 500-5 MG-MCG tablet Take 1 tablet by mouth 2 (two) times daily. 90 tablet 3   feeding supplement (ENSURE ENLIVE / ENSURE PLUS) LIQD Take 237 mLs by mouth 2 (two) times daily between meals. 237 mL 12   ibuprofen (ADVIL) 600 MG tablet Take 1 tablet (600 mg total) by mouth 3 (three) times daily. 30 tablet 0   metoprolol tartrate (LOPRESSOR) 25 MG tablet Take 1 tablet (25 mg total) by mouth 2 (two) times daily. (Patient taking differently: Take 25 mg by mouth daily.) 180 tablet 3   nitroGLYCERIN (NITROSTAT) 0.4 MG SL tablet DISSOLVE 1 TAB UNDER TONGUE FOR CHEST PAIN - IF PAIN REMAINS AFTER 5 MIN, CALL 911 AND REPEAT DOSE. MAX 3 TABS IN 15 MINUTES (Patient taking differently: 0.4 mg every 5 (five) minutes as needed for chest pain.) 25 tablet 3   ondansetron (ZOFRAN) 4 MG tablet Take 1 tablet (4 mg total) by mouth every 6 (six) hours as needed for nausea. 20 tablet 0   oxyCODONE (OXY IR/ROXICODONE) 5 MG immediate release tablet Take 2 tablets (10 mg total) by mouth every 4 (four) hours as needed for severe pain. 120 tablet 0   predniSONE (DELTASONE) 5 MG tablet Take 1 tablet (5 mg total) by mouth daily with breakfast. 90 tablet  3   senna-docusate (SENOKOT-S) 8.6-50 MG tablet Take 1 tablet by mouth at bedtime as needed for mild constipation. 30 tablet 0   No current facility-administered medications for this visit.     Allergies: No Known Allergies   Physical Exam:  Blood pressure 131/79, pulse 77, temperature 97.8 F (36.6 C), temperature source Temporal, resp. rate 18, height '6\' 2"'$  (1.88 m), weight 237 lb 8 oz (107.7 kg), SpO2 98 %.    ECOG: 1     General appearance: Alert, awake without any distress. Head: Atraumatic without abnormalities Oropharynx: Without any thrush or ulcers. Eyes: No scleral icterus. Lymph nodes: No lymphadenopathy noted in the cervical,  supraclavicular, or axillary nodes Heart:regular rate and rhythm, without any murmurs or gallops.   Lung: Clear to auscultation without any rhonchi, wheezes or dullness to percussion. Abdomin: Soft, nontender without any shifting dullness or ascites. Musculoskeletal: No clubbing or cyanosis. Neurological: No motor or sensory deficits. Skin: No rashes or lesions.         Lab Results: Lab Results  Component Value Date   WBC 4.8 08/24/2022   HGB 14.7 08/24/2022   HCT 42.9 08/24/2022   MCV 92.9 08/24/2022   PLT 249 08/24/2022     Chemistry      Component Value Date/Time   NA 142 08/24/2022 1200   K 3.3 (L) 08/24/2022 1200   CL 108 08/24/2022 1200   CO2 25 08/24/2022 1200   BUN 16 08/24/2022 1200   CREATININE 0.97 08/24/2022 1200   CREATININE 0.93 07/25/2022 1421      Component Value Date/Time   CALCIUM 8.7 (L) 08/24/2022 1200   ALKPHOS 105 08/24/2022 1200   AST 14 (L) 08/24/2022 1200   AST 13 (L) 07/25/2022 1421   ALT 13 08/24/2022 1200   ALT 11 07/25/2022 1421   BILITOT 2.4 (H) 08/24/2022 1200   BILITOT 1.7 (H) 07/25/2022 1421       Latest Reference Range & Units 01/21/22 10:49 03/15/22 08:26 05/23/22 08:38 07/25/22 14:21  Prostate Specific Ag, Serum 0.0 - 4.0 ng/mL 3.6 2.4 2.1 2.7   IMPRESSION: 1. Diffuse sclerotic osseous metastatic disease with involvement of all included osseous structures including the proximal right femur, pelvis, and lower lumbar spine. Unchanged distribution but progressive sclerosis compared to the previous CT from January of 2023. No pathologic fracture is identified. 2. Several foci of stranding within the superficial subcutaneous soft tissues of the lower anterior abdominal wall, favored to represent injection-related changes.  Narrative & Impression  CLINICAL DATA:  Prostate cancer, assess treatment response. Right hip pain.   EXAM: MRI PELVIS WITHOUT AND WITH CONTRAST   TECHNIQUE: Multiplanar multisequence MR imaging of  the pelvis was performed both before and after administration of intravenous contrast.   CONTRAST:  69m GADAVIST GADOBUTROL 1 MMOL/ML IV SOLN   COMPARISON:  MRI lumbar spine 10/01/2021. Abdominopelvic CT 09/28/2021.   FINDINGS: Urinary Tract: The visualized distal ureters appear normal in caliber. There is mild nonspecific bladder wall thickening.   Bowel: No bowel wall thickening, distention or surrounding inflammation identified within the pelvis.   Vascular/Lymphatic: Previously demonstrated retroperitoneal and pelvic lymphadenopathy has significantly improved in the interval. A small residual right pelvic sidewall node measures 8 mm short axis on image 20/7 (previously 22 mm). No progressive adenopathy identified. No significant vascular findings.   Reproductive: Previously demonstrated prostatomegaly has improved, and no definite residual infiltration in the adjacent soft tissues identified.   Other: No pelvic ascites or abnormal peritoneal enhancement identified.  Musculoskeletal: Widespread osseous metastatic disease is again noted, similar to previous studies. There is involvement of the lower lumbar spine, bony pelvis and both proximal femurs. No pathologic fractures are identified. There is mild biforaminal narrowing at L5-S1 which appears unchanged. No significant hip joint effusion. There is mild asymmetric T2 hyperintensity between the right iliac wing and iliacus muscle without focal fluid collection, abnormal enhancement or focal mass lesion. The adjacent sacroiliac joint appears unremarkable. The pelvic musculature otherwise appears unremarkable.   IMPRESSION: 1. Interval improvement in retroperitoneal and pelvic lymphadenopathy compared with previous studies consistent with response to therapy. 2. Widespread osseous metastatic disease, similar to previous studies. No pathologic fractures identified. 3. Mild asymmetric T2 hyperintensity between the right  iliac wing and iliacus muscle without focal fluid collection, abnormal enhancement or focal mass lesion. 4. Mild nonspecific bladder wall thickening. No residual ureteral dilatation evident.   MPRESSION: 1. Widespread radiotracer avid skeletal metastasis involving the axillary appendicular skeleton. The discrete foci of active metastatic disease are superimposed on a confluent lytic and sclerotic pattern on the CT. 2. Single residual radiotracer avid RIGHT metastatic iliac lymph node. Pelvic adenopathy near completely resolved compared to CT 09/28/2021. 3. Focal activity at the LEFT base of the prostate gland could represent residual carcinoma  Impression and Plan:  72 year old with:  1.  Castration-sensitive advanced prostate cancer with disease to the bone and lymphadenopathy diagnosed in January 2023.     His disease status was updated at this time and treatment choices were reviewed.  Imaging studies including PSMA PET scan obtained in December 2023 was personally reviewed and showed overall improvement in his lymphadenopathy.  He continues to have widespread skeletal involvement however.  At this time, I recommended continuing Zytiga given his reasonable response and use salvage therapy if he has rapid disease progression or PSA rise.  These options include Taxotere chemotherapy or a PARP inhibitor if he harbors the appropriate mutation.   2.  Anemia: Related to his initial malignancy and has resolved at this time.   3.  Hip pain: Related to his advanced malignancy although no clear-cut progression noted.  He was evaluated by radiation oncology for palliative radiation and felt that likely he would not benefit.  The etiology of his hip pain likely related to microfracture that is not detected by imaging.  With neuropathic component and entrapment could also possibly play a role.  For the time being, I recommended pain management with Percocet as needed.  4.  Androgen deprivation  therapy: This will be continued indefinitely.  Next injection will be in 2 months.   5.  Hydronephrosis: Improved at this time with resolution of venous lymphadenopathy.   6.  Prognosis and goals of care and disposition: His disease is incurable although aggressive measures are warranted given her reasonable performance status.  7.  Bone directed therapy: Complication associated with Delton See were reviewed.  These include osteonecrosis of the jaw and hypocalcemia.  His calcium is adequate today and we will proceed with treatment.   8.  Follow-up: In 2 months for a follow-up.  30  minutes were dedicated to this encounter.  The time was spent on updating disease status, treatment choices and outlining future plan of care review.  Zola Button, MD 1/3/202412:28 PM

## 2022-09-26 ENCOUNTER — Telehealth: Payer: Self-pay | Admitting: *Deleted

## 2022-09-26 LAB — PROSTATE-SPECIFIC AG, SERUM (LABCORP): Prostate Specific Ag, Serum: 3 ng/mL (ref 0.0–4.0)

## 2022-09-26 NOTE — Telephone Encounter (Signed)
-----   Message from [name], MD sent at 09/26/2022  8:55 AM EST ----- Please let him know his PSA is up slightly. No changes for now

## 2022-09-26 NOTE — Telephone Encounter (Signed)
PC to patient, informed him of PSA results, per Dr [name] no changes to be made at this time.  Patient verbalizes understanding.

## 2022-09-27 ENCOUNTER — Telehealth: Payer: Self-pay | Admitting: Hematology and Oncology

## 2022-09-27 NOTE — Telephone Encounter (Signed)
Called patient to r/s 3/4 appointment due to provider PAL. Patient r/s and notified.

## 2022-10-11 ENCOUNTER — Other Ambulatory Visit: Payer: Self-pay | Admitting: Cardiology

## 2022-10-11 ENCOUNTER — Other Ambulatory Visit: Payer: Self-pay | Admitting: Oncology

## 2022-10-20 ENCOUNTER — Other Ambulatory Visit: Payer: Self-pay | Admitting: Cardiology

## 2022-10-22 ENCOUNTER — Other Ambulatory Visit (HOSPITAL_COMMUNITY): Payer: Self-pay

## 2022-10-23 ENCOUNTER — Telehealth: Payer: Self-pay | Admitting: Pharmacy Technician

## 2022-10-23 ENCOUNTER — Encounter: Payer: Self-pay | Admitting: Oncology

## 2022-10-23 ENCOUNTER — Other Ambulatory Visit (HOSPITAL_COMMUNITY): Payer: Self-pay

## 2022-10-23 NOTE — Telephone Encounter (Signed)
Oral Oncology Patient Advocate Encounter   Received information regarding a denial for a lower-cost share tiering exception of Abiraterone under patient's Medicare D plan.   Upon reviewing the case, the request was denied because abiraterone falls under the category of "Specialty Medications" on the patient's plan and therefore is excluded from cost adjustment.  A lower cost option for the patient may be filling their medication using the special cash pricing at [facility].   Cash price at [facility] is $140 per month  I have reached out to the patient to discuss and gather any potential additional information,  I will continue to follow until a decision ahs been made by the patient as to how they wish to proceed.  Lady Deutscher, CPhT-Adv Oncology Pharmacy Patient [place] Direct Number: [phone]  Fax: [phone]

## 2022-10-23 NOTE — Telephone Encounter (Signed)
Oral Oncology Patient Advocate Encounter   An urgent appeal for the tier exception denial of Abiraterone  has been initiated with the plan.  Chart notes and additional information sent via e-fax to [phone]  I will continue to follow until final determination   Lady Deutscher, [place] Patient [place] Direct Number: [phone]  Fax: [phone]

## 2022-10-24 NOTE — Telephone Encounter (Signed)
Oral Oncology Patient Advocate Encounter   An urgent second level appeal for the tier exception denial of Abiraterone  has been initiated with the plan.   Chart notes and additional information sent via e-fax to [phone]   I will continue to follow until final determination    Erik Page, Erik Page Patient Erik Page Direct Number: [phone]  Fax: [phone]

## 2022-10-25 ENCOUNTER — Telehealth: Payer: Self-pay

## 2022-10-25 NOTE — Telephone Encounter (Signed)
Per request of  Drema Halon, pharmacy, patient's appointment moved up from 11/20/22.  Patient aware of initial appointment with Dr. Lorenso Courier on 10/29/22 @ 1 PM. He will come in for labs prior.

## 2022-10-28 ENCOUNTER — Encounter: Payer: Self-pay | Admitting: Oncology

## 2022-10-28 ENCOUNTER — Other Ambulatory Visit: Payer: Self-pay | Admitting: Oncology

## 2022-10-28 DIAGNOSIS — C61 Malignant neoplasm of prostate: Secondary | ICD-10-CM

## 2022-10-28 NOTE — Telephone Encounter (Addendum)
Oral Oncology Patient Advocate Encounter   Submitted appeal letter signed by MD to plan.  Submitted by e-fax [phone]  I will continue to follow until final determination is reached.  Lady Deutscher, CPhT-Adv Oncology Pharmacy Patient [place] Direct Number: [phone]  Fax: [phone]

## 2022-10-29 ENCOUNTER — Telehealth: Payer: Self-pay | Admitting: Pharmacy Technician

## 2022-10-29 ENCOUNTER — Other Ambulatory Visit: Payer: Self-pay | Admitting: Hematology and Oncology

## 2022-10-29 ENCOUNTER — Telehealth: Payer: Self-pay | Admitting: Pharmacist

## 2022-10-29 ENCOUNTER — Inpatient Hospital Stay (HOSPITAL_BASED_OUTPATIENT_CLINIC_OR_DEPARTMENT_OTHER): Payer: Medicare Other | Admitting: Hematology and Oncology

## 2022-10-29 ENCOUNTER — Inpatient Hospital Stay: Payer: Medicare Other

## 2022-10-29 ENCOUNTER — Other Ambulatory Visit: Payer: Self-pay

## 2022-10-29 ENCOUNTER — Other Ambulatory Visit (HOSPITAL_COMMUNITY): Payer: Self-pay

## 2022-10-29 ENCOUNTER — Inpatient Hospital Stay: Payer: Medicare Other | Attending: Oncology

## 2022-10-29 VITALS — BP 120/69 | HR 82 | Temp 98.6°F | Resp 13 | Wt 236.0 lb

## 2022-10-29 DIAGNOSIS — D649 Anemia, unspecified: Secondary | ICD-10-CM | POA: Diagnosis not present

## 2022-10-29 DIAGNOSIS — C61 Malignant neoplasm of prostate: Secondary | ICD-10-CM | POA: Insufficient documentation

## 2022-10-29 DIAGNOSIS — Z923 Personal history of irradiation: Secondary | ICD-10-CM | POA: Diagnosis not present

## 2022-10-29 DIAGNOSIS — C7951 Secondary malignant neoplasm of bone: Secondary | ICD-10-CM | POA: Diagnosis present

## 2022-10-29 DIAGNOSIS — Z7952 Long term (current) use of systemic steroids: Secondary | ICD-10-CM | POA: Diagnosis not present

## 2022-10-29 LAB — CMP (CANCER CENTER ONLY)
ALT: 9 U/L (ref 0–44)
AST: 11 U/L — ABNORMAL LOW (ref 15–41)
Albumin: 3.8 g/dL (ref 3.5–5.0)
Alkaline Phosphatase: 92 U/L (ref 38–126)
Anion gap: 6 (ref 5–15)
BUN: 12 mg/dL (ref 8–23)
CO2: 30 mmol/L (ref 22–32)
Calcium: 9.3 mg/dL (ref 8.9–10.3)
Chloride: 105 mmol/L (ref 98–111)
Creatinine: 0.83 mg/dL (ref 0.61–1.24)
GFR, Estimated: >60 mL/min (ref >60)
Glucose, Bld: 140 mg/dL — ABNORMAL HIGH (ref 70–99)
Potassium: 3.5 mmol/L (ref 3.5–5.1)
Sodium: 141 mmol/L (ref 135–145)
Total Bilirubin: 2 mg/dL — ABNORMAL HIGH (ref 0.3–1.2)
Total Protein: 6 g/dL — ABNORMAL LOW (ref 6.5–8.1)

## 2022-10-29 LAB — CBC WITH DIFFERENTIAL (CANCER CENTER ONLY)
Abs Immature Granulocytes: 0.01 10*3/uL (ref 0.00–0.07)
Basophils Absolute: 0 10*3/uL (ref 0.0–0.1)
Basophils Relative: 1 %
Eosinophils Absolute: 0.1 10*3/uL (ref 0.0–0.5)
Eosinophils Relative: 2 %
HCT: 36.8 % — ABNORMAL LOW (ref 39.0–52.0)
Hemoglobin: 12.6 g/dL — ABNORMAL LOW (ref 13.0–17.0)
Immature Granulocytes: 0 %
Lymphocytes Relative: 23 %
Lymphs Abs: 1 10*3/uL (ref 0.7–4.0)
MCH: 32.2 pg (ref 26.0–34.0)
MCHC: 34.2 g/dL (ref 30.0–36.0)
MCV: 94.1 fL (ref 80.0–100.0)
Monocytes Absolute: 0.3 10*3/uL (ref 0.1–1.0)
Monocytes Relative: 7 %
Neutro Abs: 2.9 10*3/uL (ref 1.7–7.7)
Neutrophils Relative %: 67 %
Platelet Count: 185 10*3/uL (ref 150–400)
RBC: 3.91 MIL/uL — ABNORMAL LOW (ref 4.22–5.81)
RDW: 12.7 % (ref 11.5–15.5)
WBC Count: 4.3 10*3/uL (ref 4.0–10.5)
nRBC: 0 % (ref 0.0–0.2)

## 2022-10-29 MED ORDER — DENOSUMAB 120 MG/1.7ML ~~LOC~~ SOLN
120.0000 mg | Freq: Once | SUBCUTANEOUS | Status: AC
  Administered 2022-10-29: 120 mg via SUBCUTANEOUS
  Filled 2022-10-29: qty 1.7

## 2022-10-29 MED ORDER — ENZALUTAMIDE 40 MG PO CAPS
160.0000 mg | ORAL_CAPSULE | Freq: Every day | ORAL | 2 refills | Status: DC
  Filled 2022-10-29: qty 120, 30d supply, fill #0

## 2022-10-29 MED ORDER — ENZALUTAMIDE 40 MG PO TABS: 160.0000 mg | ORAL_TABLET | Freq: Every day | ORAL | 2 refills | Status: DC

## 2022-10-29 MED ORDER — ENZALUTAMIDE 40 MG PO TABS
160.0000 mg | ORAL_TABLET | Freq: Every day | ORAL | 2 refills | Status: DC
  Filled 2022-10-29: qty 120, 30d supply, fill #0

## 2022-10-29 NOTE — Telephone Encounter (Addendum)
Oral Oncology Pharmacist Encounter  Received new prescription for Xtandi (enzalutamide) for the treatment of metastatic castration-sensitive prostate cancer in conjunction with ADT, planned duration until disease progression or unacceptable drug toxicity. Note patient was most recently on Zytiga, but medication is no longer affordable for patient and there is no assistance available at this time for patient, thus he is switching to [place].   CBC w/ Diff and CMP from 10/29/22 assessed, noted pt with T.bili of 2.0 mg/dL, but AST, ALT WNL. No hepatic dose adjustments required for Xtandi. Patient's last PSA on 09/25/22 was stable at 3 ng/mL. Prescription dose and frequency assessed for appropriateness.    Current medication list in Epic reviewed, DDIs with Xtandi identified: Category C drug-drug interaction between [place] and Atorvastatin - Xtandi, a strong CYP3A4 inducer may decrease serum concentrations of Atorvastatin. Noted patient on highest dose of atorvastatin. No changes in therapy warranted at this time. Category C drug-drug interaction between Xtandi and Oxycodone - Xtnadi, a strong CYP3A4 inducer may decrease oxycodone concentrations. Recommend monitoring patient for breakthrough pain. No changes in therapy warranted at this time.  Evaluated chart and no patient barriers to medication adherence noted.   Prescription has been e-scribed to the [facility] for benefits analysis and approval.  Oral Oncology Clinic will continue to follow for insurance authorization, copayment issues, initial counseling and start date.  Leron Croak, PharmD, BCPS, BCOP Hematology/Oncology Clinical Pharmacist [facility] and [place] [phone] 10/29/2022 2:52 PM

## 2022-10-29 NOTE — Telephone Encounter (Signed)
Oral Oncology Patient Advocate Encounter   Began application for assistance for Xtandi through [employer].   Application submitted via online portal   [employer] number [phone].   I will continue to check the status until final determination.   Lady Deutscher, CPhT-Adv Oncology Pharmacy Patient [place] Direct Number: [phone]  Fax: [phone]

## 2022-10-29 NOTE — Patient Instructions (Signed)
Denosumab Injection (Oncology) What is this medication? DENOSUMAB (den oh SUE mab) prevents weakened bones caused by cancer. It may also be used to treat noncancerous bone tumors that cannot be removed by surgery. It can also be used to treat high calcium levels in the blood caused by cancer. It works by blocking a protein that causes bones to break down quickly. This slows down the release of calcium from bones, which lowers calcium levels in your blood. It also makes your bones stronger and less likely to break (fracture). This medicine may be used for other purposes; ask your health care provider or pharmacist if you have questions. COMMON BRAND NAME(S): XGEVA What should I tell my care team before I take this medication? They need to know if you have any of these conditions: Dental disease Having surgery or tooth extraction Infection Kidney disease Low levels of calcium or vitamin D in the blood Malnutrition On hemodialysis Skin conditions or sensitivity Thyroid or parathyroid disease An unusual reaction to denosumab, other medications, foods, dyes, or preservatives Pregnant or trying to get pregnant Breast-feeding How should I use this medication? This medication is for injection under the skin. It is given by your care team in a hospital or clinic setting. A special MedGuide will be given to you before each treatment. Be sure to read this information carefully each time. Talk to your care team about the use of this medication in children. While it may be prescribed for children as young as 13 years for selected conditions, precautions do apply. Overdosage: If you think you have taken too much of this medicine contact a poison control center or emergency room at once. NOTE: This medicine is only for you. Do not share this medicine with others. What if I miss a dose? Keep appointments for follow-up doses. It is important not to miss your dose. Call your care team if you are unable to  keep an appointment. What may interact with this medication? Do not take this medication with any of the following: Other medications containing denosumab This medication may also interact with the following: Medications that lower your chance of fighting infection Steroid medications, such as prednisone or cortisone This list may not describe all possible interactions. Give your health care provider a list of all the medicines, herbs, non-prescription drugs, or dietary supplements you use. Also tell them if you smoke, drink alcohol, or use illegal drugs. Some items may interact with your medicine. What should I watch for while using this medication? Your condition will be monitored carefully while you are receiving this medication. You may need blood work while taking this medication. This medication may increase your risk of getting an infection. Call your care team for advice if you get a fever, chills, sore throat, or other symptoms of a cold or flu. Do not treat yourself. Try to avoid being around people who are sick. You should make sure you get enough calcium and vitamin D while you are taking this medication, unless your care team tells you not to. Discuss the foods you eat and the vitamins you take with your care team. Some people who take this medication have severe bone, joint, or muscle pain. This medication may also increase your risk for jaw problems or a broken thigh bone. Tell your care team right away if you have severe pain in your jaw, bones, joints, or muscles. Tell your care team if you have any pain that does not go away or that gets worse. Talk   to your care team if you may be pregnant. Serious birth defects can occur if you take this medication during pregnancy and for 5 months after the last dose. You will need a negative pregnancy test before starting this medication. Contraception is recommended while taking this medication and for 5 months after the last dose. Your care team  can help you find the option that works for you. What side effects may I notice from receiving this medication? Side effects that you should report to your care team as soon as possible: Allergic reactions--skin rash, itching, hives, swelling of the face, lips, tongue, or throat Bone, joint, or muscle pain Low calcium level--muscle pain or cramps, confusion, tingling, or numbness in the hands or feet Osteonecrosis of the jaw--pain, swelling, or redness in the mouth, numbness of the jaw, poor healing after dental work, unusual discharge from the mouth, visible bones in the mouth Side effects that usually do not require medical attention (report to your care team if they continue or are bothersome): Cough Diarrhea Fatigue Headache Nausea This list may not describe all possible side effects. Call your doctor for medical advice about side effects. You may report side effects to FDA at 1-800-FDA-1088. Where should I keep my medication? This medication is given in a hospital or clinic. It will not be stored at home. NOTE: This sheet is a summary. It may not cover all possible information. If you have questions about this medicine, talk to your doctor, pharmacist, or health care provider.  2023 Elsevier/Gold Standard (2022-01-28 00:00:00)  

## 2022-10-29 NOTE — Progress Notes (Signed)
[facility] Telephone:([phone]) [phone]   [phone]) [phone]  PROGRESS NOTE  Patient Care Team: [name], DO as PCP - General (Family Medicine)  Hematological/Oncological History # Metastatic Castrate Sensitive Prostate Cancer  09/30/2021: percutaneous nephrostomy tube placement and retroperitoneal lymph node biopsy  10/01/2021: Mills Koller 240 mg  09/2021: adiation therapy to the lumbar spine after receiving 30 Gray in 10 fractions  11/01/2021: started Eligard 30 mg every 4 months and Zytiga 1000 mg daily with prednisone 5 mg daily  09/25/2022: last visit with Dr. [name] 10/29/2022: transition care to Dr. Lorenso Courier. Insurance no longer assisting with Zytiga 1000 mg daily, converting enzalutamide 160 mg PO daily.    Interval History:  Erik Page 72 y.o. male with medical history significant for [facility] presents for a follow up visit. The patient's last visit was on 09/25/2022 with Dr. [name]. In the interim since the last visit he has fallen in the donut hole and is no longer able to afford his Zytiga medication.  On exam today Erik Page reports that he is apprehensive about switching to a different oncologist.  He reports he had a great relationship with Dr. [name].  He notes that he was originally diagnosed when his kidneys were shut down due to lymphadenopathy.  This was back in January 2023.  He reports that he has been on Eligard therapy and Zytiga therapy and tolerating it quite well.  He is not having any major side effects as result of this treatment.  He denies any fevers, chills, sweats, nausea, vomiting or diarrhea.  He has not been having any difficulty with worsening bone or back pain.  Overall he feels well and is willing and able to proceed with therapy at this time.  A full 10 point ROS was otherwise negative.  Today we discussed transitioning to enzalutamide due to his difficulty in affording Zytiga.  This was discussed with pharmacy who noted that patient assistance is  available for enzalutamide.  The patient has enough Zytiga to last for the next 2 to 3 weeks.  MEDICAL HISTORY:  Past Medical History:  Diagnosis Date   HTN (hypertension)    Myocardial infarction [facility])     SURGICAL HISTORY: Past Surgical History:  Procedure Laterality Date   CARDIAC CATHETERIZATION N/A 08/15/2015   Procedure: Left Heart Cath and Coronary Angiography;  Surgeon: Leonie Man, MD;  Location: [place] CV LAB;  Service: Cardiovascular;  Laterality: N/A;   CARDIAC CATHETERIZATION N/A 08/15/2015   Procedure: Left Heart Cath and Coronary Angiography;  Surgeon: Leonie Man, MD;  Location: [place] [facility];  Service: Cardiovascular;  Laterality: N/A;   CARDIAC CATHETERIZATION N/A 08/15/2015   Procedure: Coronary Stent Intervention;  Surgeon: Leonie Man, MD;  Location: [place] [facility];  Service: Cardiovascular;  Laterality: N/A;   IR CONVERT RIGHT NEPHROSTOMY TO NEPHROURETERAL CATH  11/12/2021   IR EXT NEPHROURETERAL CATH EXCHANGE  01/07/2022   IR NEPHROSTOMY EXCHANGE RIGHT  02/20/2022   IR NEPHROSTOMY PLACEMENT RIGHT  10/01/2021   TONSILLECTOMY      SOCIAL HISTORY: Social History   Socioeconomic History   Marital status: Married    Spouse name: Not on file   Number of children: Not on file   Years of education: Not on file   Highest education level: Not on file  Occupational History   Not on file  Tobacco Use   Smoking status: Never   Smokeless tobacco: Never  Vaping Use   Vaping Use: Never used  Substance  and Sexual Activity   Alcohol use: No    Alcohol/week: 0.0 standard drinks of alcohol   Drug use: No   Sexual activity: Not on file  Other Topics Concern   Not on file  Social History Narrative   Works as a Engineer, materials; 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  MEDICATIONS:  Current Outpatient Medications  Medication Sig Dispense Refill   aspirin EC 81 MG EC tablet Take 1 tablet (81 mg total) by mouth daily.     atorvastatin (LIPITOR) 80 MG tablet TAKE 1 TABLET (80 MG TOTAL) BY MOUTH DAILY. TAKE ONE TABLET BY MOUTH DAILY **MUST CALL MD FOR APPOINTMENT 30 tablet 0   calcium-vitamin D (OSCAL WITH D) 500-5 MG-MCG tablet Take 1 tablet by mouth 2 (two) times daily. 90 tablet 3   enzalutamide (XTANDI) 40 MG tablet Take 4 tablets (160 mg total) by mouth daily. 120 tablet 2   feeding supplement (ENSURE ENLIVE / ENSURE PLUS) LIQD Take 237 mLs by mouth 2 (two) times daily between meals. 237 mL 12   ibuprofen (ADVIL) 600 MG tablet Take 1 tablet (600 mg total) by mouth 3 (three) times daily. 30 tablet 0   metoprolol tartrate (LOPRESSOR) 25 MG tablet TAKE 1 TABLET BY MOUTH TWICE A DAY 14 tablet 0   nitroGLYCERIN (NITROSTAT) 0.4 MG SL tablet DISSOLVE 1 TAB UNDER TONGUE FOR CHEST PAIN - IF PAIN REMAINS AFTER 5 MIN, CALL 911 AND REPEAT DOSE. MAX 3 TABS IN 15 MINUTES (Patient taking differently: 0.4 mg every 5 (five) minutes as needed for chest pain.) 25 tablet 3   ondansetron (ZOFRAN) 4 MG tablet Take 1 tablet (4 mg total) by mouth every 6 (six) hours as needed for nausea. 20 tablet 0   oxyCODONE (OXY IR/ROXICODONE) 5 MG immediate release tablet Take 2 tablets (10 mg total) by mouth every 4 (four) hours as needed for severe pain. 120 tablet 0   senna-docusate (SENOKOT-S) 8.6-50 MG tablet Take 1 tablet by mouth at bedtime as needed for mild constipation. 30 tablet 0   No current facility-administered medications for this visit.    REVIEW OF SYSTEMS:   Constitutional: ( - ) fevers, ( - )  chills , ( - ) night sweats Eyes: ( - ) blurriness of vision, ( - )  double vision, ( - ) watery eyes Ears, nose, mouth, throat, and face: ( - ) mucositis, ( - ) sore throat Respiratory: ( - ) cough, ( - ) dyspnea, ( - ) wheezes Cardiovascular: ( - ) palpitation, ( - ) chest discomfort, ( - ) lower extremity swelling Gastrointestinal:  ( - ) nausea, ( - ) heartburn, ( - ) change in bowel habits Skin: ( - ) abnormal skin rashes Lymphatics: ( - ) new lymphadenopathy, ( - ) easy bruising Neurological: ( - ) numbness, ( - ) tingling, ( - ) new weaknesses Behavioral/Psych: ( - ) mood change, ( - ) new changes  All other systems were reviewed with the patient and are negative.  PHYSICAL EXAMINATION: ECOG PERFORMANCE  STATUS: 0 - Asymptomatic  Vitals:   10/29/22 1233  BP: 120/69  Pulse: 82  Resp: 13  Temp: 98.6 F (37 C)  SpO2: 99%   Filed Weights   10/29/22 1233  Weight: 236 lb (107 kg)    GENERAL: Well-appearing elderly African-American male, alert, no distress and comfortable SKIN: skin color, texture, turgor are normal, no rashes or significant lesions EYES: conjunctiva are pink and non-injected, sclera clear LUNGS: clear to auscultation and percussion with normal breathing effort HEART: regular rate & rhythm and no murmurs and no lower extremity edema Musculoskeletal: no cyanosis of digits and no clubbing  PSYCH: alert & oriented x 3, fluent speech NEURO: no focal motor/sensory deficits  LABORATORY DATA:  I have reviewed the data as listed    Latest Ref Rng & Units 10/29/2022   12:08 PM 09/25/2022   12:32 PM 08/24/2022   12:00 PM  CBC  WBC 4.0 - 10.5 K/uL 4.3  3.8  4.8   Hemoglobin 13.0 - 17.0 g/dL 12.6  12.7  14.7   Hematocrit 39.0 - 52.0 % 36.8  36.3  42.9   Platelets 150 - 400 K/uL 185  175  249        Latest Ref Rng & Units 10/29/2022   12:08 PM 09/25/2022   12:32 PM 08/24/2022   12:00 PM  CMP  Glucose 70 - 99 mg/dL 140  117  144   BUN 8 - 23 mg/dL 12  18  16   $ Creatinine 0.61 - 1.24 mg/dL 0.83  0.81  0.97   Sodium 135 - 145 mmol/L  141  142  142   Potassium 3.5 - 5.1 mmol/L 3.5  3.5  3.3   Chloride 98 - 111 mmol/L 105  103  108   CO2 22 - 32 mmol/L 30  34  25   Calcium 8.9 - 10.3 mg/dL 9.3  9.5  8.7   Total Protein 6.5 - 8.1 g/dL 6.0  5.7  6.9   Total Bilirubin 0.3 - 1.2 mg/dL 2.0  1.8  2.4   Alkaline Phos 38 - 126 U/L 92  89  105   AST 15 - 41 U/L 11  11  14   $ ALT 0 - 44 U/L 9  10  13     $ RADIOGRAPHIC STUDIES: No results found.  ASSESSMENT & PLAN Erik Page 72 y.o. male with medical history significant for [facility] presents for a follow up visit.   # Metastatic Castrate Sensitive Prostate Cancer  --continue lupron 70m subq q 4 months indefinitely  --transition from Zytiga to enzalutamide 1633mPO daily due to cost.  -- Labs today show white blood cell count 4.3, hemoglobin 12.6, MCV 94.1, and platelets of 185.  Creatinine is 0.83 with mild increase in bilirubin to 2.0. -- Last PSA on 09/25/2022 was 3.0. -- Return to clinic in 4 months time for labs/injection with interval monthly Xgeva treatments  No orders of the defined types were placed in this encounter.   All questions were answered. The patient knows to call the clinic with any problems, questions or concerns.  A total of more than 40 minutes were spent on this encounter with face-to-face time and non-face-to-face time, including preparing to see the patient, ordering tests and/or medications, counseling the patient and coordination of care as outlined above.   JoLedell PeoplesMD Department of Hematology/Oncology CoC[facility] WeHamilton Medical Centerhone: [phone]: [phone]: [email or web address]  11/10/2022 3:40 PM

## 2022-10-29 NOTE — Telephone Encounter (Addendum)
,  Oral Oncology Patient Advocate Encounter   Received notification that prior authorization for Gillermina Phy is required.   PA submitted on 10/29/22 Key [id number]  PA must be completed via phone [phone] Status is pending     Lady Deutscher, [place] Patient [place] Direct Number: [phone]  Fax: [phone]

## 2022-10-29 NOTE — Telephone Encounter (Signed)
Oral Oncology Patient Advocate Encounter  Prior Authorization for Gillermina Phy has been approved.    PA# [id number] Effective dates: 09/29/22 through 10/29/23  Patients co-pay is $1,627.76.    Lady Deutscher, CPhT-Adv Oncology Pharmacy Patient [place] Direct Number: [phone]  Fax: [phone]

## 2022-10-30 NOTE — Telephone Encounter (Signed)
Oral Oncology Patient Advocate Encounter  Received notification that the request for prior authorization for Abiraterone has been denied.  Patient has elected to change therapies at this time to try Xtandi (enzalutamide) and this case will be closed.    Lady Deutscher, CPhT-Adv Oncology Pharmacy Patient [place] Direct Number: [phone]  Fax: [phone]

## 2022-10-31 LAB — PROSTATE-SPECIFIC AG, SERUM (LABCORP): Prostate Specific Ag, Serum: 4 ng/mL (ref 0.0–4.0)

## 2022-10-31 LAB — TESTOSTERONE: Testosterone: <3 ng/dL — ABNORMAL LOW (ref 264–916)

## 2022-11-05 ENCOUNTER — Other Ambulatory Visit: Payer: Self-pay | Admitting: Cardiology

## 2022-11-06 ENCOUNTER — Other Ambulatory Visit: Payer: Self-pay | Admitting: Cardiology

## 2022-11-06 ENCOUNTER — Other Ambulatory Visit (HOSPITAL_COMMUNITY): Payer: Self-pay

## 2022-11-07 NOTE — Telephone Encounter (Signed)
Oral Oncology Patient Advocate Encounter  Called to check status of assistance application for Xtandi through [employer].  Representative stated they would send a message to the case manager to expedite the case review.  Lady Deutscher, CPhT-Adv Oncology Pharmacy Patient [place] Direct Number: [phone]  Fax: [phone]

## 2022-11-10 ENCOUNTER — Encounter: Payer: Self-pay | Admitting: Hematology and Oncology

## 2022-11-13 NOTE — Telephone Encounter (Signed)
Oral Oncology Patient Advocate Encounter   Submitted POI to [place] via online portal.   Application submitted via e-fax to [phone] as well.   [place] phone number [phone]   I will continue to check the status until final determination.   Lady Deutscher, CPhT-Adv Oncology Pharmacy Patient [place] Direct Number: [phone]  Fax: [phone]

## 2022-11-18 NOTE — Telephone Encounter (Signed)
Oral Oncology Patient Advocate Encounter   Received notification that the application for assistance for Xtandi through [employer] has been approved.   [employer] number [phone].   Effective dates: 11/18/22 through 09/23/23  I have spoken to the patient.  Lady Deutscher, CPhT-Adv Oncology Pharmacy Patient [place] Direct Number: [phone]  Fax: [phone]

## 2022-11-20 ENCOUNTER — Other Ambulatory Visit: Payer: Medicare Other

## 2022-11-20 ENCOUNTER — Ambulatory Visit: Payer: Medicare Other | Admitting: Hematology and Oncology

## 2022-11-20 ENCOUNTER — Ambulatory Visit: Payer: Medicare Other

## 2022-11-21 NOTE — Telephone Encounter (Signed)
Oral Chemotherapy Pharmacist Encounter  I spoke with patient for overview of: Xtandi for the treatment of metastatic, castration-sensitive prostate cancer in conjunction with ADT, planned duration until disease progression or unacceptable toxicity.   Counseled patient on administration, dosing, side effects, monitoring, drug-food interactions, safe handling, storage, and disposal.  Patient will take Xtandi '40mg'$  tablets, 4 tablets ('160mg'$  total) by mouth once daily without regard to food.  Patient knows to avoid grapefruit and grapefruit juice while on Xtandi.  Xtandi start date: 11/23/22 (Patient will take last dose of Zytiga/prednisone on 11/22/22)  Adverse effects include but are not limited to: peripheral edema, hypertension, hot flashes, fatigue, and arthralgias. Patient instructed about small risk of seizures with Xtandi treatment.  Reviewed with patient importance of keeping a medication schedule and plan for any missed doses. No barriers to medication adherence identified.  Medication reconciliation performed and medication/allergy list updated.  All questions answered.  Erik Page voiced understanding and appreciation.   Medication education handout placed in mail for patient. Patient knows to call the office with questions or concerns. Oral Chemotherapy Clinic phone number provided to patient.   Leron Croak, PharmD, BCPS, BCOP Hematology/Oncology Clinical Pharmacist [facility] and [place] [phone] 11/21/2022 10:08 AM

## 2022-11-24 ENCOUNTER — Other Ambulatory Visit: Payer: Self-pay | Admitting: Hematology and Oncology

## 2022-11-24 DIAGNOSIS — C61 Malignant neoplasm of prostate: Secondary | ICD-10-CM

## 2022-11-25 ENCOUNTER — Ambulatory Visit: Payer: Medicare Other

## 2022-11-25 ENCOUNTER — Other Ambulatory Visit: Payer: Self-pay

## 2022-11-25 ENCOUNTER — Other Ambulatory Visit: Payer: Medicare Other

## 2022-11-25 ENCOUNTER — Inpatient Hospital Stay: Payer: Medicare Other

## 2022-11-25 ENCOUNTER — Inpatient Hospital Stay: Payer: Medicare Other | Attending: Oncology

## 2022-11-25 ENCOUNTER — Ambulatory Visit: Payer: Medicare Other | Admitting: Hematology and Oncology

## 2022-11-25 VITALS — BP 126/76 | HR 78 | Temp 99.0°F | Resp 20

## 2022-11-25 DIAGNOSIS — Z923 Personal history of irradiation: Secondary | ICD-10-CM | POA: Insufficient documentation

## 2022-11-25 DIAGNOSIS — M79604 Pain in right leg: Secondary | ICD-10-CM

## 2022-11-25 DIAGNOSIS — C61 Malignant neoplasm of prostate: Secondary | ICD-10-CM | POA: Diagnosis present

## 2022-11-25 DIAGNOSIS — Z7952 Long term (current) use of systemic steroids: Secondary | ICD-10-CM | POA: Insufficient documentation

## 2022-11-25 DIAGNOSIS — C7951 Secondary malignant neoplasm of bone: Secondary | ICD-10-CM | POA: Diagnosis not present

## 2022-11-25 LAB — CMP (CANCER CENTER ONLY)
ALT: 13 U/L (ref 0–44)
AST: 14 U/L — ABNORMAL LOW (ref 15–41)
Albumin: 4.2 g/dL (ref 3.5–5.0)
Alkaline Phosphatase: 109 U/L (ref 38–126)
Anion gap: 6 (ref 5–15)
BUN: 14 mg/dL (ref 8–23)
CO2: 32 mmol/L (ref 22–32)
Calcium: 9.5 mg/dL (ref 8.9–10.3)
Chloride: 104 mmol/L (ref 98–111)
Creatinine: 0.85 mg/dL (ref 0.61–1.24)
GFR, Estimated: >60 mL/min (ref >60)
Glucose, Bld: 128 mg/dL — ABNORMAL HIGH (ref 70–99)
Potassium: 4.2 mmol/L (ref 3.5–5.1)
Sodium: 142 mmol/L (ref 135–145)
Total Bilirubin: 2.3 mg/dL — ABNORMAL HIGH (ref 0.3–1.2)
Total Protein: 6.8 g/dL (ref 6.5–8.1)

## 2022-11-25 LAB — CBC WITH DIFFERENTIAL (CANCER CENTER ONLY)
Abs Immature Granulocytes: 0.01 10*3/uL (ref 0.00–0.07)
Basophils Absolute: 0 10*3/uL (ref 0.0–0.1)
Basophils Relative: 0 %
Eosinophils Absolute: 0.1 10*3/uL (ref 0.0–0.5)
Eosinophils Relative: 2 %
HCT: 39.2 % (ref 39.0–52.0)
Hemoglobin: 13.7 g/dL (ref 13.0–17.0)
Immature Granulocytes: 0 %
Lymphocytes Relative: 32 %
Lymphs Abs: 1.2 10*3/uL (ref 0.7–4.0)
MCH: 32.2 pg (ref 26.0–34.0)
MCHC: 34.9 g/dL (ref 30.0–36.0)
MCV: 92 fL (ref 80.0–100.0)
Monocytes Absolute: 0.3 10*3/uL (ref 0.1–1.0)
Monocytes Relative: 7 %
Neutro Abs: 2.3 10*3/uL (ref 1.7–7.7)
Neutrophils Relative %: 59 %
Platelet Count: 186 10*3/uL (ref 150–400)
RBC: 4.26 MIL/uL (ref 4.22–5.81)
RDW: 12.2 % (ref 11.5–15.5)
WBC Count: 3.8 10*3/uL — ABNORMAL LOW (ref 4.0–10.5)
nRBC: 0 % (ref 0.0–0.2)

## 2022-11-25 LAB — URIC ACID: Uric Acid, Serum: 4.2 mg/dL (ref 3.7–8.6)

## 2022-11-25 MED ORDER — LEUPROLIDE ACETATE (4 MONTH) 30 MG ~~LOC~~ KIT
30.0000 mg | PACK | Freq: Once | SUBCUTANEOUS | Status: AC
  Administered 2022-11-25: 30 mg via SUBCUTANEOUS
  Filled 2022-11-25: qty 30

## 2022-11-25 MED ORDER — DENOSUMAB 120 MG/1.7ML ~~LOC~~ SOLN
120.0000 mg | Freq: Once | SUBCUTANEOUS | Status: AC
  Administered 2022-11-25: 120 mg via SUBCUTANEOUS
  Filled 2022-11-25: qty 1.7

## 2022-11-25 NOTE — Patient Instructions (Signed)
Leuprolide Suspension for Injection (Prostate Cancer) What is this medication? LEUPROLIDE (loo PROE lide) reduces the symptoms of prostate cancer. It works by decreasing levels of the hormone testosterone in the body. This prevents prostate cancer cells from spreading or growing. This medicine may be used for other purposes; ask your health care provider or pharmacist if you have questions. COMMON BRAND NAME(S): Eligard, Lupron Depot, Lupron Depot-Ped, Lutrate Depot, Viadur What should I tell my care team before I take this medication? They need to know if you have any of these conditions: Diabetes Heart disease Heart failure High or low levels of electrolytes, such as magnesium, potassium, or sodium in your blood Irregular heartbeat or rhythm Seizures An unusual or allergic reaction to leuprolide, other medications, foods, dyes, or preservatives Pregnant or trying to get pregnant Breast-feeding How should I use this medication? This medication is injected under the skin or into a muscle. It is given by your care team in a hospital or clinic setting. Talk to your care team about the use of this medication in children. Special care may be needed. Overdosage: If you think you have taken too much of this medicine contact a poison control center or emergency room at once. NOTE: This medicine is only for you. Do not share this medicine with others. What if I miss a dose? Keep appointments for follow-up doses. It is important not to miss your dose. Call your care team if you are unable to keep an appointment. What may interact with this medication? Do not take this medication with any of the following: Cisapride Dronedarone Ketoconazole Levoketoconazole Pimozide Thioridazine This medication may also interact with the following: Other medications that cause heart rhythm changes This list may not describe all possible interactions. Give your health care provider a list of all the medicines,  herbs, non-prescription drugs, or dietary supplements you use. Also tell them if you smoke, drink alcohol, or use illegal drugs. Some items may interact with your medicine. What should I watch for while using this medication? Visit your care team for regular checks on your progress. Tell your care team if your symptoms do not start to get better or if they get worse. This medication may increase blood sugar. The risk may be higher in patients who already have diabetes. Ask your care team what you can do to lower the risk of diabetes while taking this medication. This medication may cause infertility. Talk to your care team if you are concerned about your fertility. Heart attacks and strokes have been reported with the use of this medication. Get emergency help if you develop signs or symptoms of a heart attack or stroke. Talk to your care team about the risks and benefits of this medication. What side effects may I notice from receiving this medication? Side effects that you should report to your care team as soon as possible: Allergic reactions--skin rash, itching, hives, swelling of the face, lips, tongue, or throat Heart attack--pain or tightness in the chest, shoulders, arms, or jaw, nausea, shortness of breath, cold or clammy skin, feeling faint or lightheaded Heart rhythm changes--fast or irregular heartbeat, dizziness, feeling faint or lightheaded, chest pain, trouble breathing High blood sugar (hyperglycemia)--increased thirst or amount of urine, unusual weakness or fatigue, blurry vision Mood swings, irritability, hostility Seizures Stroke--sudden numbness or weakness of the face, arm, or leg, trouble speaking, confusion, trouble walking, loss of balance or coordination, dizziness, severe headache, change in vision Thoughts of suicide or self-harm, worsening mood, feelings of depression Side  effects that usually do not require medical attention (report to your care team if they continue or  are bothersome): Bone pain Change in sex drive or performance General discomfort and fatigue Hot flashes Muscle pain Pain, redness, or irritation at injection site Swelling of the ankles, hands, or feet This list may not describe all possible side effects. Call your doctor for medical advice about side effects. You may report side effects to FDA at 1-800-FDA-1088. Where should I keep my medication? This medication is given in a hospital or clinic. It will not be stored at home. NOTE: This sheet is a summary. It may not cover all possible information. If you have questions about this medicine, talk to your doctor, pharmacist, or health care provider.  2023 Elsevier/Gold Standard (2021-11-19 00:00:00) Denosumab Injection (Oncology) What is this medication? DENOSUMAB (den oh SUE mab) prevents weakened bones caused by cancer. It may also be used to treat noncancerous bone tumors that cannot be removed by surgery. It can also be used to treat high calcium levels in the blood caused by cancer. It works by blocking a protein that causes bones to break down quickly. This slows down the release of calcium from bones, which lowers calcium levels in your blood. It also makes your bones stronger and less likely to break (fracture). This medicine may be used for other purposes; ask your health care provider or pharmacist if you have questions. COMMON BRAND NAME(S): XGEVA What should I tell my care team before I take this medication? They need to know if you have any of these conditions: Dental disease Having surgery or tooth extraction Infection Kidney disease Low levels of calcium or vitamin D in the blood Malnutrition On hemodialysis Skin conditions or sensitivity Thyroid or parathyroid disease An unusual reaction to denosumab, other medications, foods, dyes, or preservatives Pregnant or trying to get pregnant Breast-feeding How should I use this medication? This medication is for injection  under the skin. It is given by your care team in a hospital or clinic setting. A special MedGuide will be given to you before each treatment. Be sure to read this information carefully each time. Talk to your care team about the use of this medication in children. While it may be prescribed for children as young as 13 years for selected conditions, precautions do apply. Overdosage: If you think you have taken too much of this medicine contact a poison control center or emergency room at once. NOTE: This medicine is only for you. Do not share this medicine with others. What if I miss a dose? Keep appointments for follow-up doses. It is important not to miss your dose. Call your care team if you are unable to keep an appointment. What may interact with this medication? Do not take this medication with any of the following: Other medications containing denosumab This medication may also interact with the following: Medications that lower your chance of fighting infection Steroid medications, such as prednisone or cortisone This list may not describe all possible interactions. Give your health care provider a list of all the medicines, herbs, non-prescription drugs, or dietary supplements you use. Also tell them if you smoke, drink alcohol, or use illegal drugs. Some items may interact with your medicine. What should I watch for while using this medication? Your condition will be monitored carefully while you are receiving this medication. You may need blood work while taking this medication. This medication may increase your risk of getting an infection. Call your care team for  advice if you get a fever, chills, sore throat, or other symptoms of a cold or flu. Do not treat yourself. Try to avoid being around people who are sick. You should make sure you get enough calcium and vitamin D while you are taking this medication, unless your care team tells you not to. Discuss the foods you eat and the  vitamins you take with your care team. Some people who take this medication have severe bone, joint, or muscle pain. This medication may also increase your risk for jaw problems or a broken thigh bone. Tell your care team right away if you have severe pain in your jaw, bones, joints, or muscles. Tell your care team if you have any pain that does not go away or that gets worse. Talk to your care team if you may be pregnant. Serious birth defects can occur if you take this medication during pregnancy and for 5 months after the last dose. You will need a negative pregnancy test before starting this medication. Contraception is recommended while taking this medication and for 5 months after the last dose. Your care team can help you find the option that works for you. What side effects may I notice from receiving this medication? Side effects that you should report to your care team as soon as possible: Allergic reactions--skin rash, itching, hives, swelling of the face, lips, tongue, or throat Bone, joint, or muscle pain Low calcium level--muscle pain or cramps, confusion, tingling, or numbness in the hands or feet Osteonecrosis of the jaw--pain, swelling, or redness in the mouth, numbness of the jaw, poor healing after dental work, unusual discharge from the mouth, visible bones in the mouth Side effects that usually do not require medical attention (report to your care team if they continue or are bothersome): Cough Diarrhea Fatigue Headache Nausea This list may not describe all possible side effects. Call your doctor for medical advice about side effects. You may report side effects to FDA at 1-800-FDA-1088. Where should I keep my medication? This medication is given in a hospital or clinic. It will not be stored at home. NOTE: This sheet is a summary. It may not cover all possible information. If you have questions about this medicine, talk to your doctor, pharmacist, or health care provider.   2023 Elsevier/Gold Standard (2022-01-28 00:00:00)

## 2022-11-27 LAB — PROSTATE-SPECIFIC AG, SERUM (LABCORP): Prostate Specific Ag, Serum: 5.9 ng/mL — ABNORMAL HIGH (ref 0.0–4.0)

## 2022-12-23 ENCOUNTER — Inpatient Hospital Stay: Payer: Medicare Other

## 2022-12-23 ENCOUNTER — Ambulatory Visit: Payer: Medicare Other

## 2022-12-23 ENCOUNTER — Other Ambulatory Visit: Payer: Medicare Other

## 2022-12-23 ENCOUNTER — Other Ambulatory Visit: Payer: Self-pay | Admitting: Hematology and Oncology

## 2022-12-23 ENCOUNTER — Other Ambulatory Visit: Payer: Self-pay

## 2022-12-23 ENCOUNTER — Inpatient Hospital Stay: Payer: Medicare Other | Attending: Oncology

## 2022-12-23 VITALS — BP 129/76 | HR 75 | Temp 99.0°F | Resp 20

## 2022-12-23 DIAGNOSIS — C61 Malignant neoplasm of prostate: Secondary | ICD-10-CM

## 2022-12-23 DIAGNOSIS — Z923 Personal history of irradiation: Secondary | ICD-10-CM | POA: Diagnosis not present

## 2022-12-23 DIAGNOSIS — C7951 Secondary malignant neoplasm of bone: Secondary | ICD-10-CM | POA: Insufficient documentation

## 2022-12-23 LAB — CMP (CANCER CENTER ONLY)
ALT: 12 U/L (ref 0–44)
AST: 13 U/L — ABNORMAL LOW (ref 15–41)
Albumin: 4 g/dL (ref 3.5–5.0)
Alkaline Phosphatase: 91 U/L (ref 38–126)
Anion gap: 6 (ref 5–15)
BUN: 16 mg/dL (ref 8–23)
CO2: 30 mmol/L (ref 22–32)
Calcium: 9.3 mg/dL (ref 8.9–10.3)
Chloride: 104 mmol/L (ref 98–111)
Creatinine: 0.92 mg/dL (ref 0.61–1.24)
GFR, Estimated: >60 mL/min (ref >60)
Glucose, Bld: 127 mg/dL — ABNORMAL HIGH (ref 70–99)
Potassium: 3.6 mmol/L (ref 3.5–5.1)
Sodium: 140 mmol/L (ref 135–145)
Total Bilirubin: 1.5 mg/dL — ABNORMAL HIGH (ref 0.3–1.2)
Total Protein: 6.4 g/dL — ABNORMAL LOW (ref 6.5–8.1)

## 2022-12-23 LAB — CBC WITH DIFFERENTIAL (CANCER CENTER ONLY)
Abs Immature Granulocytes: 0.01 10*3/uL (ref 0.00–0.07)
Basophils Absolute: 0 10*3/uL (ref 0.0–0.1)
Basophils Relative: 1 %
Eosinophils Absolute: 0.1 10*3/uL (ref 0.0–0.5)
Eosinophils Relative: 4 %
HCT: 37.9 % — ABNORMAL LOW (ref 39.0–52.0)
Hemoglobin: 13.3 g/dL (ref 13.0–17.0)
Immature Granulocytes: 0 %
Lymphocytes Relative: 34 %
Lymphs Abs: 1.1 10*3/uL (ref 0.7–4.0)
MCH: 32.3 pg (ref 26.0–34.0)
MCHC: 35.1 g/dL (ref 30.0–36.0)
MCV: 92 fL (ref 80.0–100.0)
Monocytes Absolute: 0.2 10*3/uL (ref 0.1–1.0)
Monocytes Relative: 7 %
Neutro Abs: 1.7 10*3/uL (ref 1.7–7.7)
Neutrophils Relative %: 54 %
Platelet Count: 193 10*3/uL (ref 150–400)
RBC: 4.12 MIL/uL — ABNORMAL LOW (ref 4.22–5.81)
RDW: 11.9 % (ref 11.5–15.5)
WBC Count: 3.1 10*3/uL — ABNORMAL LOW (ref 4.0–10.5)
nRBC: 0 % (ref 0.0–0.2)

## 2022-12-23 MED ORDER — DENOSUMAB 120 MG/1.7ML ~~LOC~~ SOLN
120.0000 mg | Freq: Once | SUBCUTANEOUS | Status: AC
  Administered 2022-12-23: 120 mg via SUBCUTANEOUS
  Filled 2022-12-23: qty 1.7

## 2022-12-25 LAB — PROSTATE-SPECIFIC AG, SERUM (LABCORP): Prostate Specific Ag, Serum: 4.7 ng/mL — ABNORMAL HIGH (ref 0.0–4.0)

## 2022-12-25 LAB — TESTOSTERONE: Testosterone: <3 ng/dL — ABNORMAL LOW (ref 264–916)

## 2023-01-04 ENCOUNTER — Other Ambulatory Visit: Payer: Self-pay | Admitting: Cardiology

## 2023-01-08 ENCOUNTER — Other Ambulatory Visit: Payer: Self-pay | Admitting: Cardiology

## 2023-01-18 ENCOUNTER — Other Ambulatory Visit: Payer: Self-pay | Admitting: Hematology and Oncology

## 2023-01-18 DIAGNOSIS — C61 Malignant neoplasm of prostate: Secondary | ICD-10-CM

## 2023-01-20 ENCOUNTER — Other Ambulatory Visit: Payer: Self-pay

## 2023-01-20 ENCOUNTER — Ambulatory Visit: Payer: Medicare Other

## 2023-01-20 ENCOUNTER — Inpatient Hospital Stay: Payer: Medicare Other

## 2023-01-20 ENCOUNTER — Other Ambulatory Visit: Payer: Medicare Other

## 2023-01-20 VITALS — BP 131/74 | HR 59 | Temp 98.3°F | Resp 20

## 2023-01-20 DIAGNOSIS — C61 Malignant neoplasm of prostate: Secondary | ICD-10-CM

## 2023-01-20 DIAGNOSIS — C7951 Secondary malignant neoplasm of bone: Secondary | ICD-10-CM | POA: Diagnosis not present

## 2023-01-20 LAB — CBC WITH DIFFERENTIAL (CANCER CENTER ONLY)
Abs Immature Granulocytes: 0.02 10*3/uL (ref 0.00–0.07)
Basophils Absolute: 0 10*3/uL (ref 0.0–0.1)
Basophils Relative: 1 %
Eosinophils Absolute: 0.1 10*3/uL (ref 0.0–0.5)
Eosinophils Relative: 4 %
HCT: 36.6 % — ABNORMAL LOW (ref 39.0–52.0)
Hemoglobin: 12.9 g/dL — ABNORMAL LOW (ref 13.0–17.0)
Immature Granulocytes: 1 %
Lymphocytes Relative: 34 %
Lymphs Abs: 1.1 10*3/uL (ref 0.7–4.0)
MCH: 32.3 pg (ref 26.0–34.0)
MCHC: 35.2 g/dL (ref 30.0–36.0)
MCV: 91.7 fL (ref 80.0–100.0)
Monocytes Absolute: 0.2 10*3/uL (ref 0.1–1.0)
Monocytes Relative: 7 %
Neutro Abs: 1.7 10*3/uL (ref 1.7–7.7)
Neutrophils Relative %: 53 %
Platelet Count: 204 10*3/uL (ref 150–400)
RBC: 3.99 MIL/uL — ABNORMAL LOW (ref 4.22–5.81)
RDW: 12.1 % (ref 11.5–15.5)
WBC Count: 3.1 10*3/uL — ABNORMAL LOW (ref 4.0–10.5)
nRBC: 0 % (ref 0.0–0.2)

## 2023-01-20 LAB — CMP (CANCER CENTER ONLY)
ALT: 11 U/L (ref 0–44)
AST: 15 U/L (ref 15–41)
Albumin: 3.9 g/dL (ref 3.5–5.0)
Alkaline Phosphatase: 81 U/L (ref 38–126)
Anion gap: 7 (ref 5–15)
BUN: 16 mg/dL (ref 8–23)
CO2: 30 mmol/L (ref 22–32)
Calcium: 9.2 mg/dL (ref 8.9–10.3)
Chloride: 105 mmol/L (ref 98–111)
Creatinine: 0.95 mg/dL (ref 0.61–1.24)
GFR, Estimated: >60 mL/min (ref >60)
Glucose, Bld: 123 mg/dL — ABNORMAL HIGH (ref 70–99)
Potassium: 3.4 mmol/L — ABNORMAL LOW (ref 3.5–5.1)
Sodium: 142 mmol/L (ref 135–145)
Total Bilirubin: 1.5 mg/dL — ABNORMAL HIGH (ref 0.3–1.2)
Total Protein: 6.2 g/dL — ABNORMAL LOW (ref 6.5–8.1)

## 2023-01-20 MED ORDER — DENOSUMAB 120 MG/1.7ML ~~LOC~~ SOLN
120.0000 mg | Freq: Once | SUBCUTANEOUS | Status: AC
  Administered 2023-01-20: 120 mg via SUBCUTANEOUS
  Filled 2023-01-20: qty 1.7

## 2023-01-20 NOTE — Patient Instructions (Signed)
Denosumab Injection (Oncology) What is this medication? DENOSUMAB (den oh SUE mab) prevents weakened bones caused by cancer. It may also be used to treat noncancerous bone tumors that cannot be removed by surgery. It can also be used to treat high calcium levels in the blood caused by cancer. It works by blocking a protein that causes bones to break down quickly. This slows down the release of calcium from bones, which lowers calcium levels in your blood. It also makes your bones stronger and less likely to break (fracture). This medicine may be used for other purposes; ask your health care provider or pharmacist if you have questions. COMMON BRAND NAME(S): XGEVA What should I tell my care team before I take this medication? They need to know if you have any of these conditions: Dental disease Having surgery or tooth extraction Infection Kidney disease Low levels of calcium or vitamin D in the blood Malnutrition On hemodialysis Skin conditions or sensitivity Thyroid or parathyroid disease An unusual reaction to denosumab, other medications, foods, dyes, or preservatives Pregnant or trying to get pregnant Breast-feeding How should I use this medication? This medication is for injection under the skin. It is given by your care team in a hospital or clinic setting. A special MedGuide will be given to you before each treatment. Be sure to read this information carefully each time. Talk to your care team about the use of this medication in children. While it may be prescribed for children as young as 13 years for selected conditions, precautions do apply. Overdosage: If you think you have taken too much of this medicine contact a poison control center or emergency room at once. NOTE: This medicine is only for you. Do not share this medicine with others. What if I miss a dose? Keep appointments for follow-up doses. It is important not to miss your dose. Call your care team if you are unable to  keep an appointment. What may interact with this medication? Do not take this medication with any of the following: Other medications containing denosumab This medication may also interact with the following: Medications that lower your chance of fighting infection Steroid medications, such as prednisone or cortisone This list may not describe all possible interactions. Give your health care provider a list of all the medicines, herbs, non-prescription drugs, or dietary supplements you use. Also tell them if you smoke, drink alcohol, or use illegal drugs. Some items may interact with your medicine. What should I watch for while using this medication? Your condition will be monitored carefully while you are receiving this medication. You may need blood work while taking this medication. This medication may increase your risk of getting an infection. Call your care team for advice if you get a fever, chills, sore throat, or other symptoms of a cold or flu. Do not treat yourself. Try to avoid being around people who are sick. You should make sure you get enough calcium and vitamin D while you are taking this medication, unless your care team tells you not to. Discuss the foods you eat and the vitamins you take with your care team. Some people who take this medication have severe bone, joint, or muscle pain. This medication may also increase your risk for jaw problems or a broken thigh bone. Tell your care team right away if you have severe pain in your jaw, bones, joints, or muscles. Tell your care team if you have any pain that does not go away or that gets worse. Talk   to your care team if you may be pregnant. Serious birth defects can occur if you take this medication during pregnancy and for 5 months after the last dose. You will need a negative pregnancy test before starting this medication. Contraception is recommended while taking this medication and for 5 months after the last dose. Your care team  can help you find the option that works for you. What side effects may I notice from receiving this medication? Side effects that you should report to your care team as soon as possible: Allergic reactions--skin rash, itching, hives, swelling of the face, lips, tongue, or throat Bone, joint, or muscle pain Low calcium level--muscle pain or cramps, confusion, tingling, or numbness in the hands or feet Osteonecrosis of the jaw--pain, swelling, or redness in the mouth, numbness of the jaw, poor healing after dental work, unusual discharge from the mouth, visible bones in the mouth Side effects that usually do not require medical attention (report to your care team if they continue or are bothersome): Cough Diarrhea Fatigue Headache Nausea This list may not describe all possible side effects. Call your doctor for medical advice about side effects. You may report side effects to FDA at 1-800-FDA-1088. Where should I keep my medication? This medication is given in a hospital or clinic. It will not be stored at home. NOTE: This sheet is a summary. It may not cover all possible information. If you have questions about this medicine, talk to your doctor, pharmacist, or health care provider.  2023 Elsevier/Gold Standard (2022-01-28 00:00:00)  

## 2023-01-22 LAB — TESTOSTERONE: Testosterone: <3 ng/dL — ABNORMAL LOW (ref 264–916)

## 2023-01-22 LAB — PROSTATE-SPECIFIC AG, SERUM (LABCORP): Prostate Specific Ag, Serum: 7.2 ng/mL — ABNORMAL HIGH (ref 0.0–4.0)

## 2023-01-30 ENCOUNTER — Other Ambulatory Visit: Payer: Self-pay | Admitting: Cardiology

## 2023-02-05 ENCOUNTER — Other Ambulatory Visit: Payer: Self-pay | Admitting: Hematology and Oncology

## 2023-02-05 DIAGNOSIS — C61 Malignant neoplasm of prostate: Secondary | ICD-10-CM

## 2023-02-18 ENCOUNTER — Other Ambulatory Visit: Payer: Self-pay | Admitting: *Deleted

## 2023-02-18 DIAGNOSIS — C61 Malignant neoplasm of prostate: Secondary | ICD-10-CM

## 2023-02-19 ENCOUNTER — Inpatient Hospital Stay: Payer: Medicare Other | Attending: Oncology

## 2023-02-19 ENCOUNTER — Ambulatory Visit: Payer: Medicare Other

## 2023-02-19 ENCOUNTER — Other Ambulatory Visit: Payer: Medicare Other

## 2023-02-19 ENCOUNTER — Inpatient Hospital Stay (HOSPITAL_BASED_OUTPATIENT_CLINIC_OR_DEPARTMENT_OTHER): Payer: Medicare Other | Admitting: Hematology and Oncology

## 2023-02-19 ENCOUNTER — Ambulatory Visit: Payer: Medicare Other | Admitting: Hematology and Oncology

## 2023-02-19 ENCOUNTER — Inpatient Hospital Stay: Payer: Medicare Other

## 2023-02-19 VITALS — BP 132/84 | HR 65 | Temp 98.5°F | Resp 13 | Wt 243.7 lb

## 2023-02-19 DIAGNOSIS — C61 Malignant neoplasm of prostate: Secondary | ICD-10-CM

## 2023-02-19 DIAGNOSIS — C7951 Secondary malignant neoplasm of bone: Secondary | ICD-10-CM | POA: Insufficient documentation

## 2023-02-19 LAB — CMP (CANCER CENTER ONLY)
ALT: 10 U/L (ref 0–44)
AST: 12 U/L — ABNORMAL LOW (ref 15–41)
Albumin: 4 g/dL (ref 3.5–5.0)
Alkaline Phosphatase: 80 U/L (ref 38–126)
Anion gap: 5 (ref 5–15)
BUN: 17 mg/dL (ref 8–23)
CO2: 30 mmol/L (ref 22–32)
Calcium: 8.8 mg/dL — ABNORMAL LOW (ref 8.9–10.3)
Chloride: 105 mmol/L (ref 98–111)
Creatinine: 0.93 mg/dL (ref 0.61–1.24)
GFR, Estimated: >60 mL/min (ref >60)
Glucose, Bld: 147 mg/dL — ABNORMAL HIGH (ref 70–99)
Potassium: 3.8 mmol/L (ref 3.5–5.1)
Sodium: 140 mmol/L (ref 135–145)
Total Bilirubin: 1 mg/dL (ref 0.3–1.2)
Total Protein: 6.6 g/dL (ref 6.5–8.1)

## 2023-02-19 LAB — CBC WITH DIFFERENTIAL (CANCER CENTER ONLY)
Abs Immature Granulocytes: 0.01 10*3/uL (ref 0.00–0.07)
Basophils Absolute: 0 10*3/uL (ref 0.0–0.1)
Basophils Relative: 1 %
Eosinophils Absolute: 0.1 10*3/uL (ref 0.0–0.5)
Eosinophils Relative: 3 %
HCT: 38.5 % — ABNORMAL LOW (ref 39.0–52.0)
Hemoglobin: 13.2 g/dL (ref 13.0–17.0)
Immature Granulocytes: 0 %
Lymphocytes Relative: 35 %
Lymphs Abs: 1.2 10*3/uL (ref 0.7–4.0)
MCH: 31.5 pg (ref 26.0–34.0)
MCHC: 34.3 g/dL (ref 30.0–36.0)
MCV: 91.9 fL (ref 80.0–100.0)
Monocytes Absolute: 0.3 10*3/uL (ref 0.1–1.0)
Monocytes Relative: 8 %
Neutro Abs: 1.8 10*3/uL (ref 1.7–7.7)
Neutrophils Relative %: 53 %
Platelet Count: 204 10*3/uL (ref 150–400)
RBC: 4.19 MIL/uL — ABNORMAL LOW (ref 4.22–5.81)
RDW: 12.3 % (ref 11.5–15.5)
WBC Count: 3.4 10*3/uL — ABNORMAL LOW (ref 4.0–10.5)
nRBC: 0 % (ref 0.0–0.2)

## 2023-02-19 MED ORDER — ATORVASTATIN CALCIUM 80 MG PO TABS: 80.0000 mg | ORAL_TABLET | Freq: Every day | ORAL | 1 refills | Status: DC

## 2023-02-19 MED ORDER — DENOSUMAB 120 MG/1.7ML ~~LOC~~ SOLN
120.0000 mg | Freq: Once | SUBCUTANEOUS | Status: AC
  Administered 2023-02-19: 120 mg via SUBCUTANEOUS
  Filled 2023-02-19: qty 1.7

## 2023-02-19 NOTE — Progress Notes (Signed)
[facility] [phone]   Fax:(336) [phone]  PROGRESS NOTE  Patient Care Team: Lorelei Pont, DO as PCP - General (Family Medicine)  Hematological/Oncological History # Metastatic Castrate Sensitive Prostate Cancer  09/30/2021: percutaneous nephrostomy tube placement and retroperitoneal lymph node biopsy  10/01/2021: Deborra Medina 240 mg  09/2021: radiation therapy to the lumbar spine after receiving 30 Gray in 10 fractions  11/01/2021: started Eligard 30 mg every 4 months and Zytiga 1000 mg daily with prednisone 5 mg daily  09/25/2022: last visit with Dr. Clelia Croft 10/29/2022: transition care to Dr. Leonides Schanz. Insurance no longer assisting with Zytiga 1000 mg daily, converting enzalutamide 160 mg PO daily.    Interval History:  Erik Page [age] y.o. male with medical history significant for [facility] presents for a follow up visit. The patient's last visit was on 10/29/2022. In the interim since the last visit he has transitioned to enzalutamide therapy.   On exam today Erik Page reports he has been well overall in the interim since her last visit.  He continues taking his enzalutamide 160 mg p.o. daily without difficulty.  He takes them without any side effects.  He notes they are not causing any issues with hot flashes, fevers, chills, sweats, nausea, vomiting or diarrhea.  He notes that he has noticed a decrease in his back pain.  He does typically take ibuprofen in order to help with his back pain.  Overall he feels well and is willing and able to proceed with therapy at this time.  A full 10 point ROS was otherwise negative.   MEDICAL HISTORY:  Past Medical History:  Diagnosis Date   HTN (hypertension)    Myocardial infarction [facility])     SURGICAL HISTORY: Past Surgical History:  Procedure Laterality Date   CARDIAC CATHETERIZATION N/A 08/15/2015   Procedure: Left Heart Cath and Coronary Angiography;  Surgeon: Marykay Lex, MD;  Location: [facility] INVASIVE CV LAB;  Service:  Cardiovascular;  Laterality: N/A;   CARDIAC CATHETERIZATION N/A 08/15/2015   Procedure: Left Heart Cath and Coronary Angiography;  Surgeon: Marykay Lex, MD;  Location: [facility] INVASIVE CV LAB;  Service: Cardiovascular;  Laterality: N/A;   CARDIAC CATHETERIZATION N/A 08/15/2015   Procedure: Coronary Stent Intervention;  Surgeon: Marykay Lex, MD;  Location: [facility] INVASIVE CV LAB;  Service: Cardiovascular;  Laterality: N/A;   IR CONVERT RIGHT NEPHROSTOMY TO NEPHROURETERAL CATH  11/12/2021   IR EXT NEPHROURETERAL CATH EXCHANGE  01/07/2022   IR NEPHROSTOMY EXCHANGE RIGHT  02/20/2022   IR NEPHROSTOMY PLACEMENT RIGHT  10/01/2021   TONSILLECTOMY      SOCIAL HISTORY: Social History   Socioeconomic History   Marital status: Married    Spouse name: Not on file   Number of children: Not on file   Years of education: Not on file   Highest education level: Not on file  Occupational History   Not on file  Tobacco Use   Smoking status: Never   Smokeless tobacco: Never  Vaping Use   Vaping Use: Never used  Substance and Sexual Activity   Alcohol use: No    Alcohol/week: 0.0 standard drinks of alcohol   Drug use: No   Sexual activity: Not on file  Other Topics Concern   Not on file  Social History Narrative   Works as a Pensions consultant; 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  MEDICATIONS:  Current Outpatient Medications  Medication Sig Dispense Refill   atorvastatin (LIPITOR) 80 MG tablet Take 1 tablet (80 mg total) by mouth daily. 90 tablet 1   aspirin EC 81 MG EC tablet Take 1 tablet (81 mg total) by mouth  daily.     atorvastatin (LIPITOR) 80 MG tablet TAKE 1 TABLET BY MOUTH EVERY DAY (MUST CALL MD FOR APPOINTMENT) 15 tablet 0   calcium-vitamin D (OSCAL WITH D) 500-5 MG-MCG tablet Take 1 tablet by mouth 2 (two) times daily. 90 tablet 3   feeding supplement (ENSURE ENLIVE / ENSURE PLUS) LIQD Take 237 mLs by mouth 2 (two) times daily between meals. 237 mL 12   ibuprofen (ADVIL) 600 MG tablet Take 1 tablet (600 mg total) by mouth 3 (three) times daily. 30 tablet 0   metoprolol tartrate (LOPRESSOR) 25 MG tablet TAKE 1 TABLET BY MOUTH TWICE A DAY 14 tablet 0   nitroGLYCERIN (NITROSTAT) 0.4 MG SL tablet DISSOLVE 1 TAB UNDER TONGUE FOR CHEST PAIN - IF PAIN REMAINS AFTER 5 MIN, CALL 911 AND REPEAT DOSE. MAX 3 TABS IN 15 MINUTES (Patient taking differently: 0.4 mg every 5 (five) minutes as needed for chest pain.) 25 tablet 3   ondansetron (ZOFRAN) 4 MG tablet Take 1 tablet (4 mg total) by mouth every 6 (six) hours as needed for nausea. 20 tablet 0   oxyCODONE (OXY IR/ROXICODONE) 5 MG immediate release tablet Take 2 tablets (10 mg total) by mouth every 4 (four) hours as needed for severe pain. 120 tablet 0   senna-docusate (SENOKOT-S) 8.6-50 MG tablet Take 1 tablet by mouth at bedtime as needed for mild constipation. 30 tablet 0   XTANDI 40 MG tablet Take 4 tablets (160mg ) by mouth once daily as directed by physician. 120 tablet 1   No current facility-administered medications for this visit.    REVIEW OF SYSTEMS:   Constitutional: ( - ) fevers, ( - )  chills , ( - ) night sweats Eyes: ( - ) blurriness of vision, ( - ) double vision, ( - ) watery eyes Ears, nose, mouth, throat, and face: ( - ) mucositis, ( - ) sore throat Respiratory: ( - ) cough, ( - ) dyspnea, ( - ) wheezes Cardiovascular: ( - ) palpitation, ( - ) chest discomfort, ( - ) lower extremity swelling Gastrointestinal:  ( - ) nausea, ( - ) heartburn, ( - ) change in bowel habits Skin: ( - ) abnormal skin rashes Lymphatics: ( - ) new  lymphadenopathy, ( - ) easy bruising Neurological: ( - ) numbness, ( - ) tingling, ( - ) new weaknesses Behavioral/Psych: ( - ) mood change, ( - ) new changes  All other systems were reviewed with the patient and are negative.  PHYSICAL EXAMINATION: ECOG PERFORMANCE STATUS: 0 - Asymptomatic  Vitals:   02/19/23 0928  BP: 132/84  Pulse: 65  Resp: 13  Temp: 98.5 F (36.9 C)  SpO2: 99%    Filed Weights   02/19/23 0928  Weight: 243 lb 11.2 oz (110.5 kg)     GENERAL: Well-appearing elderly African-American male, alert, no distress and comfortable SKIN: skin color, texture, turgor are normal, no  rashes or significant lesions EYES: conjunctiva are pink and non-injected, sclera clear LUNGS: clear to auscultation and percussion with normal breathing effort HEART: regular rate & rhythm and no murmurs and no lower extremity edema Musculoskeletal: no cyanosis of digits and no clubbing  PSYCH: alert & oriented x 3, fluent speech NEURO: no focal motor/sensory deficits  LABORATORY DATA:  I have reviewed the data as listed    Latest Ref Rng & Units 02/19/2023    9:02 AM 01/20/2023    8:56 AM 12/23/2022    9:15 AM  CBC  WBC 4.0 - 10.5 K/uL 3.4  3.1  3.1   Hemoglobin 13.0 - 17.0 g/dL 62.9  52.8  41.3   Hematocrit 39.0 - 52.0 % 38.5  36.6  37.9   Platelets 150 - 400 K/uL 204  204  193        Latest Ref Rng & Units 02/19/2023    9:02 AM 01/20/2023    8:56 AM 12/23/2022    9:15 AM  CMP  Glucose 70 - 99 mg/dL 244  010  272   BUN 8 - 23 mg/dL 17  16  16    Creatinine 0.61 - 1.24 mg/dL 5.36  6.44  0.34   Sodium 135 - 145 mmol/L 140  142  140   Potassium 3.5 - 5.1 mmol/L 3.8  3.4  3.6   Chloride 98 - 111 mmol/L 105  105  104   CO2 22 - 32 mmol/L 30  30  30    Calcium 8.9 - 10.3 mg/dL 8.8  9.2  9.3   Total Protein 6.5 - 8.1 g/dL 6.6  6.2  6.4   Total Bilirubin 0.3 - 1.2 mg/dL 1.0  1.5  1.5   Alkaline Phos 38 - 126 U/L 80  81  91   AST 15 - 41 U/L 12  15  13    ALT 0 - 44 U/L 10  11  12       RADIOGRAPHIC STUDIES: No results found.  ASSESSMENT & PLAN Erik Page [age] y.o. male with medical history significant for [facility] presents for a follow up visit.   # Metastatic Castrate Sensitive Prostate Cancer  --continue lupron 30mg  subq q 4 months indefinitely  --transition from Zytiga to enzalutamide 160mg  PO daily due to cost.  --continue enzalutamide as prescribed.  -- Labs today show white blood cell count 3.4, hemoglobin 13.2, MCV 91.9, and platelets of 204 -- Last PSA on 01/20/2023 was 7.2.  It appears to be increasing. --If PSA continues to rise on enzalutamide therapy would need to consider alternative treatments.  Would recommend consideration of Pluvicto versus chemotherapy --Will administer Xgeva and Lupron every 4 months. -- Return to clinic in 4 months time for labs/injection  No orders of the defined types were placed in this encounter.   All questions were answered. The patient knows to call the clinic with any problems, questions or concerns.  A total of more than 30 minutes were spent on this encounter with face-to-face time and non-face-to-face time, including preparing to see the patient, ordering tests and/or medications, counseling the patient and coordination of care as outlined above.   Erik Barns, MD Department of Hematology/Oncology [facility] at [facility] Phone: [phone] Pager: [phone] Email: [email or web address]  02/19/2023 5:14 PM

## 2023-02-20 ENCOUNTER — Telehealth: Payer: Self-pay | Admitting: Hematology and Oncology

## 2023-02-20 LAB — TESTOSTERONE: Testosterone: <3 ng/dL — ABNORMAL LOW (ref 264–916)

## 2023-02-20 LAB — PROSTATE-SPECIFIC AG, SERUM (LABCORP): Prostate Specific Ag, Serum: 11.6 ng/mL — ABNORMAL HIGH (ref 0.0–4.0)

## 2023-03-11 ENCOUNTER — Telehealth: Payer: Self-pay | Admitting: Hematology and Oncology

## 2023-03-25 ENCOUNTER — Other Ambulatory Visit: Payer: Self-pay | Admitting: Hematology and Oncology

## 2023-03-25 DIAGNOSIS — C61 Malignant neoplasm of prostate: Secondary | ICD-10-CM

## 2023-04-16 ENCOUNTER — Inpatient Hospital Stay: Payer: Medicare Other

## 2023-04-16 ENCOUNTER — Other Ambulatory Visit: Payer: Self-pay

## 2023-04-16 ENCOUNTER — Other Ambulatory Visit: Payer: Self-pay | Admitting: Hematology and Oncology

## 2023-04-16 ENCOUNTER — Inpatient Hospital Stay: Payer: Medicare Other | Attending: Oncology

## 2023-04-16 VITALS — BP 118/70 | HR 67 | Temp 98.5°F | Resp 18

## 2023-04-16 DIAGNOSIS — C7951 Secondary malignant neoplasm of bone: Secondary | ICD-10-CM | POA: Diagnosis present

## 2023-04-16 DIAGNOSIS — C61 Malignant neoplasm of prostate: Secondary | ICD-10-CM

## 2023-04-16 LAB — CMP (CANCER CENTER ONLY)
ALT: 10 U/L (ref 0–44)
AST: 13 U/L — ABNORMAL LOW (ref 15–41)
Albumin: 3.9 g/dL (ref 3.5–5.0)
Alkaline Phosphatase: 79 U/L (ref 38–126)
Anion gap: 5 (ref 5–15)
BUN: 17 mg/dL (ref 8–23)
CO2: 30 mmol/L (ref 22–32)
Calcium: 9.1 mg/dL (ref 8.9–10.3)
Chloride: 106 mmol/L (ref 98–111)
Creatinine: 0.93 mg/dL (ref 0.61–1.24)
GFR, Estimated: >60 mL/min (ref >60)
Glucose, Bld: 115 mg/dL — ABNORMAL HIGH (ref 70–99)
Potassium: 4.1 mmol/L (ref 3.5–5.1)
Sodium: 141 mmol/L (ref 135–145)
Total Bilirubin: 1.1 mg/dL (ref 0.3–1.2)
Total Protein: 6.2 g/dL — ABNORMAL LOW (ref 6.5–8.1)

## 2023-04-16 LAB — CBC WITH DIFFERENTIAL (CANCER CENTER ONLY)
Abs Immature Granulocytes: 0.01 10*3/uL (ref 0.00–0.07)
Basophils Absolute: 0 10*3/uL (ref 0.0–0.1)
Basophils Relative: 1 %
Eosinophils Absolute: 0.1 10*3/uL (ref 0.0–0.5)
Eosinophils Relative: 3 %
HCT: 37.7 % — ABNORMAL LOW (ref 39.0–52.0)
Hemoglobin: 13.2 g/dL (ref 13.0–17.0)
Immature Granulocytes: 0 %
Lymphocytes Relative: 34 %
Lymphs Abs: 1.1 10*3/uL (ref 0.7–4.0)
MCH: 31.9 pg (ref 26.0–34.0)
MCHC: 35 g/dL (ref 30.0–36.0)
MCV: 91.1 fL (ref 80.0–100.0)
Monocytes Absolute: 0.2 10*3/uL (ref 0.1–1.0)
Monocytes Relative: 6 %
Neutro Abs: 1.8 10*3/uL (ref 1.7–7.7)
Neutrophils Relative %: 56 %
Platelet Count: 187 10*3/uL (ref 150–400)
RBC: 4.14 MIL/uL — ABNORMAL LOW (ref 4.22–5.81)
RDW: 12.3 % (ref 11.5–15.5)
WBC Count: 3.2 10*3/uL — ABNORMAL LOW (ref 4.0–10.5)
nRBC: 0 % (ref 0.0–0.2)

## 2023-04-16 MED ORDER — DENOSUMAB 120 MG/1.7ML ~~LOC~~ SOLN
120.0000 mg | Freq: Once | SUBCUTANEOUS | Status: AC
  Administered 2023-04-16: 120 mg via SUBCUTANEOUS
  Filled 2023-04-16: qty 1.7

## 2023-04-16 MED ORDER — LEUPROLIDE ACETATE (4 MONTH) 30 MG ~~LOC~~ KIT
30.0000 mg | PACK | Freq: Once | SUBCUTANEOUS | Status: AC
  Administered 2023-04-16: 30 mg via SUBCUTANEOUS
  Filled 2023-04-16: qty 30

## 2023-04-17 LAB — PROSTATE-SPECIFIC AG, SERUM (LABCORP): Prostate Specific Ag, Serum: 23.5 ng/mL — ABNORMAL HIGH (ref 0.0–4.0)

## 2023-04-17 LAB — TESTOSTERONE: Testosterone: <3 ng/dL — ABNORMAL LOW (ref 264–916)

## 2023-04-21 ENCOUNTER — Telehealth: Payer: Self-pay | Admitting: *Deleted

## 2023-04-21 NOTE — Telephone Encounter (Signed)
Received call from pt inquiring about  his rising PSA. He is asking what the next steps are. Advised that I would have Dr. Leonides Schanz give him a call. Pt voiced understanding. Dr. Leonides Schanz made aware.

## 2023-04-25 ENCOUNTER — Other Ambulatory Visit: Payer: Self-pay

## 2023-04-25 ENCOUNTER — Telehealth: Payer: Self-pay | Admitting: Pharmacist

## 2023-04-25 ENCOUNTER — Other Ambulatory Visit (HOSPITAL_COMMUNITY): Payer: Self-pay

## 2023-04-25 DIAGNOSIS — C61 Malignant neoplasm of prostate: Secondary | ICD-10-CM

## 2023-04-25 MED ORDER — ABIRATERONE ACETATE 250 MG PO TABS
1000.0000 mg | ORAL_TABLET | Freq: Every day | ORAL | 2 refills | Status: DC
Start: 2023-04-25 — End: 2023-07-08
  Filled 2023-04-25 (×2): qty 120, 30d supply, fill #0
  Filled 2023-05-20: qty 120, 30d supply, fill #1
  Filled 2023-06-12: qty 120, 30d supply, fill #2

## 2023-04-25 MED ORDER — PREDNISONE 5 MG PO TABS: 5.0000 mg | ORAL_TABLET | Freq: Every day | ORAL | 1 refills | Status: DC

## 2023-04-25 NOTE — Telephone Encounter (Signed)
Oral Oncology Pharmacist Encounter  Received new prescription for re-initiation of Zytiga (abiraterone acetate) for the treatment of mCRPC in conjunction with prednisone and ADT, planned duration until disease progression or unacceptable drug toxicity.   Patient was previously on Zytiga, but the [employer] became unaffordable at the beginning of 2024 through patient's insurance, and there were no grant options available for prostate cancer at that time, so the patient was switched to [place], which had a manufacturer assistance program available for patient to obtain the drug at no cost. Patient did not discontinue Zytiga due to disease progression previously. Patient now has disease progression on Xtandi, requiring patient to switch therapies. Patient agreeable to paying cash price of Zytiga while awaiting prostate grant funding to open in the future.  CBC w/ Diff and CMP from 04/16/23 assessed, no relevant lab abnormalities requiring baseline dose adjustment required at this time. Patient PSA from 04/16/23 23.5 ng/mL (up from 11.6 ng/mL on 02/19/23). Prescription dose and frequency assessed for appropriateness.  Current medication list in Epic reviewed, DDIs with Zytgia identified: Category C drug-drug interaction between Zytiga and Atorvastatin - Zytiga may increase risk of myalgia side effects from atorvastatin - recommend monitoring for myalgias. No change in therapy warranted at this time. Category C drug-drug interaction between Zytiga and Metoprolol tartrate -  Zytiga, a moderate CYP2D6 inhibitor may increase serum concentrations of metoprolol tartrate. Recommend monitoring patient for hypotension, decreased HR while on both agents (patient BP 132/84 mmHg and HR of 65 bpm on 02/19/23). No change in therapy warranted at this time.  Evaluated chart and no patient barriers to medication adherence noted.   Prescription has been e-scribed to the [facility] for benefits analysis and  approval.  Oral Oncology Clinic will continue to follow for insurance authorization, copayment issues, initial counseling and start date.  Lenord Carbo, PharmD, BCPS, [facility] Hematology/Oncology Clinical Pharmacist [facility] and [facility] Oral Chemotherapy Navigation Clinics [phone] 04/25/2023 9:55 AM

## 2023-04-25 NOTE — Telephone Encounter (Signed)
Oral Chemotherapy Pharmacist Encounter  I spoke with patient for review of: Zytiga (abiraterone acetate) for the treatment of metastatic, castration-resistant prostate cancer in conjunction with prednisone, planned duration until disease progression or unacceptable toxicity.   Counseled patient on administration, dosing, side effects, monitoring, drug-food interactions, safe handling, storage, and disposal.  Patient will take Zytiga 250mg  tablets, 4 tablets (1000mg ) by mouth once daily on an empty stomach, 1 hour before or 2 hours after a meal.  Patient will take prednisone 5mg  tablet, 1 tablet by mouth one daily with breakfast.  Zytiga re-initiation date: 05/01/23 (patient will stop Xtandi 04/30/23)  Adverse effects include but are not limited to: peripheral edema, GI upset, hypertension, hot flashes, fatigue, and arthralgias.    Prednisone prescription has been sent to [facility] on 04/25/23. Patient will obtain prednisone and knows to start prednisone on the same day as Zytiga start.  Reviewed with patient importance of keeping a medication schedule and plan for any missed doses. No barriers to medication adherence identified.  Medication reconciliation performed and medication/allergy list updated.  Patient agreeable to paying cash price for Zytiga until prostate cancer grant becomes available to help cover his insurance OOP cost. This will ship from the [facility] outpatient pharmacy on 04/28/23 to deliver to patient's home on 04/30/23.  All questions answered.  Mr. Sippel voiced understanding and appreciation.   Medication education handout placed in mail for patient. Patient knows to call the office with questions or concerns. Oral Chemotherapy Clinic phone number provided to patient.   Lenord Carbo, PharmD, BCPS, BCOP Hematology/Oncology Clinical Pharmacist [facility] and [facility] Oral Chemotherapy Navigation Clinics [phone] 04/25/2023 11:00 AM

## 2023-04-28 ENCOUNTER — Other Ambulatory Visit (HOSPITAL_COMMUNITY): Payer: Self-pay

## 2023-05-13 ENCOUNTER — Other Ambulatory Visit: Payer: Self-pay | Admitting: Hematology and Oncology

## 2023-05-20 ENCOUNTER — Other Ambulatory Visit (HOSPITAL_COMMUNITY): Payer: Self-pay

## 2023-06-10 ENCOUNTER — Other Ambulatory Visit: Payer: Self-pay | Admitting: Hematology and Oncology

## 2023-06-10 DIAGNOSIS — C61 Malignant neoplasm of prostate: Secondary | ICD-10-CM

## 2023-06-10 NOTE — Progress Notes (Signed)
[facility] Telephone:(336) [phone]   Fax:(336) [phone]  PROGRESS NOTE  Patient Care Team: Lorelei Pont, DO as PCP - General (Family Medicine)  Hematological/Oncological History # Metastatic Castrate Sensitive Prostate Cancer  09/30/2021: percutaneous nephrostomy tube placement and retroperitoneal lymph node biopsy  10/01/2021: Deborra Medina 240 mg  09/2021: radiation therapy to the lumbar spine after receiving 30 Gray in 10 fractions  11/01/2021: started Eligard 30 mg every 4 months and Zytiga 1000 mg daily with prednisone 5 mg daily  09/25/2022: last visit with Dr. Clelia Croft 10/29/2022: transition care to Dr. Leonides Schanz. Insurance no longer assisting with Zytiga 1000 mg daily, converting enzalutamide 160 mg PO daily.    Interval History:  Erik Page 72 y.o. male with medical history significant for [facility] presents for a follow up visit. The patient's last visit was on 02/19/2023. In the interim since the last visit he has transitioned to back to zytiga therapy.   On exam today Erik Page reports he has been tolerating Zytiga 1000 mg p.o. daily quite well.  He is taking his prednisone as prescribed with it.  He reports he feels like it is working.  He notes that the hot flashes can be "a beast" with Zytiga, he was not experiencing this as much with the enzalutamide.  He reports that he is not having any other side effect such as nausea, vomiting, or diarrhea.  His energy levels are "so-so".  He reports he has had a few bad days where his energy is low and he is struggling.  He notes that he is trying to eat well and notes he would like to get his weight back down.  Otherwise he denies any fevers, chills, sweats, nausea, vomiting or diarrhea..  Overall he feels well and is willing and able to proceed with therapy at this time.  A full 10 point ROS was otherwise negative.   MEDICAL HISTORY:  Past Medical History:  Diagnosis Date   HTN (hypertension)    Myocardial infarction [facility])      SURGICAL HISTORY: Past Surgical History:  Procedure Laterality Date   CARDIAC CATHETERIZATION N/A 08/15/2015   Procedure: Left Heart Cath and Coronary Angiography;  Surgeon: Marykay Lex, MD;  Location: [facility] INVASIVE CV LAB;  Service: Cardiovascular;  Laterality: N/A;   CARDIAC CATHETERIZATION N/A 08/15/2015   Procedure: Left Heart Cath and Coronary Angiography;  Surgeon: Marykay Lex, MD;  Location: [facility] INVASIVE CV LAB;  Service: Cardiovascular;  Laterality: N/A;   CARDIAC CATHETERIZATION N/A 08/15/2015   Procedure: Coronary Stent Intervention;  Surgeon: Marykay Lex, MD;  Location: [facility] INVASIVE CV LAB;  Service: Cardiovascular;  Laterality: N/A;   IR CONVERT RIGHT NEPHROSTOMY TO NEPHROURETERAL CATH  11/12/2021   IR EXT NEPHROURETERAL CATH EXCHANGE  01/07/2022   IR NEPHROSTOMY EXCHANGE RIGHT  02/20/2022   IR NEPHROSTOMY PLACEMENT RIGHT  10/01/2021   TONSILLECTOMY      SOCIAL HISTORY: Social History   Socioeconomic History   Marital status: Married    Spouse name: Not on file   Number of children: Not on file   Years of education: Not on file   Highest education level: Not on file  Occupational History   Not on file  Tobacco Use   Smoking status: Never   Smokeless tobacco: Never  Vaping Use   Vaping status: Never Used  Substance and Sexual Activity   Alcohol use: No    Alcohol/week: 0.0 standard drinks of alcohol   Drug use: No   Sexual activity: Not  on file  Other Topics Concern   Not on file  Social History Narrative   Works as a Pensions consultant; 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  MEDICATIONS:  Current Outpatient Medications  Medication Sig Dispense Refill   abiraterone acetate (ZYTIGA) 250 MG tablet Take 4 tablets (1,000 mg total) by mouth daily. Take on an empty stomach 1 hour before or 2 hours after a meal 120 tablet 2   aspirin EC 81 MG EC tablet Take 1 tablet (81 mg total) by mouth daily.     atorvastatin (LIPITOR) 80 MG tablet Take 1 tablet (80 mg total) by mouth daily. 90 tablet 1   calcium-vitamin D (OSCAL WITH D) 500-5 MG-MCG tablet Take 1 tablet by mouth 2 (two) times daily. 90 tablet 3   feeding supplement (ENSURE ENLIVE / ENSURE PLUS) LIQD Take 237 mLs by mouth 2 (two) times daily between meals. 237 mL 12   ibuprofen (ADVIL) 600 MG tablet Take 1 tablet (600 mg total) by mouth 3 (three) times daily. 30 tablet 0   metoprolol tartrate (LOPRESSOR) 25 MG tablet TAKE 1 TABLET BY MOUTH TWICE A DAY 14 tablet 0   nitroGLYCERIN (NITROSTAT) 0.4 MG SL tablet DISSOLVE 1 TAB UNDER TONGUE FOR CHEST PAIN - IF PAIN REMAINS AFTER 5 MIN, CALL 911 AND REPEAT DOSE. MAX 3 TABS IN 15 MINUTES (Patient taking differently: 0.4 mg every 5 (five) minutes as needed for chest pain.) 25 tablet 3   ondansetron (ZOFRAN) 4 MG tablet Take 1 tablet (4 mg total) by mouth every 6 (six) hours as needed for nausea. 20 tablet 0   oxyCODONE (OXY IR/ROXICODONE) 5 MG immediate release tablet Take 2 tablets (10 mg total) by mouth every 4 (four) hours as needed for severe pain. 120 tablet 0   predniSONE (DELTASONE) 5 MG tablet Take 1 tablet (5 mg total) by mouth daily with breakfast. 90 tablet 1   senna-docusate (SENOKOT-S) 8.6-50 MG tablet Take 1 tablet by mouth at bedtime as needed for mild constipation. 30 tablet 0   No current facility-administered medications for this visit.    REVIEW OF SYSTEMS:   Constitutional: ( - ) fevers, ( - )  chills , ( - ) night sweats Eyes: ( - ) blurriness of vision, ( - ) double vision, ( - ) watery eyes Ears, nose, mouth, throat, and face: ( - )  mucositis, ( - ) sore throat Respiratory: ( - ) cough, ( - ) dyspnea, ( - ) wheezes Cardiovascular: ( - ) palpitation, ( - ) chest discomfort, ( - ) lower extremity swelling Gastrointestinal:  ( - ) nausea, ( - ) heartburn, ( - ) change in bowel habits Skin: ( - ) abnormal skin rashes Lymphatics: ( - ) new lymphadenopathy, ( - ) easy bruising Neurological: ( - ) numbness, ( - ) tingling, ( - ) new weaknesses Behavioral/Psych: ( - ) mood change, ( - ) new changes  All other systems were reviewed with the patient and are negative.  PHYSICAL EXAMINATION: ECOG PERFORMANCE STATUS: 0 -  Asymptomatic  Vitals:   09/18/Page 1103  BP: 118/73  Pulse: 74  Resp: 14  Temp: 98.1 F (36.7 C)  SpO2: 100%   Filed Weights   09/18/Page 1103  Weight: 237 lb 9.6 oz (107.8 kg)    GENERAL: Well-appearing elderly African-American male, alert, no distress and comfortable SKIN: skin color, texture, turgor are normal, no rashes or significant lesions EYES: conjunctiva are pink and non-injected, sclera clear LUNGS: clear to auscultation and percussion with normal breathing effort HEART: regular rate & rhythm and no murmurs and no lower extremity edema Musculoskeletal: no cyanosis of digits and no clubbing  PSYCH: alert & oriented x 3, fluent speech NEURO: no focal motor/sensory deficits  LABORATORY DATA:  I have reviewed the data as listed    Latest Ref Rng & Units 06/11/2023   10:09 AM 7/Page/2024    9:07 AM 02/19/2023    9:02 AM  CBC  WBC 4.0 - 10.5 K/uL 4.1  3.2  3.4   Hemoglobin 13.0 - 17.0 g/dL 84.1  32.4  40.1   Hematocrit 39.0 - 52.0 % 37.3  37.7  38.5   Platelets 150 - 400 K/uL 175  187  204        Latest Ref Rng & Units 06/11/2023   10:09 AM 7/Page/2024    9:07 AM 02/19/2023    9:02 AM  CMP  Glucose 70 - 99 mg/dL 027  253  664   BUN 8 - 23 mg/dL 15  17  17    Creatinine 0.61 - 1.Page mg/dL 4.03  4.74  2.59   Sodium 135 - 145 mmol/L 141  141  140   Potassium 3.5 - 5.1 mmol/L 3.3  4.1  3.8    Chloride 98 - 111 mmol/L 104  106  105   CO2 22 - 32 mmol/L 32  30  30   Calcium 8.9 - 10.3 mg/dL 8.9  9.1  8.8   Total Protein 6.5 - 8.1 g/dL 6.2  6.2  6.6   Total Bilirubin 0.3 - 1.2 mg/dL 1.8  1.1  1.0   Alkaline Phos 38 - 126 U/L 84  79  80   AST 15 - 41 U/L 13  13  12    ALT 0 - 44 U/L 13  10  10      RADIOGRAPHIC STUDIES: No results found.  ASSESSMENT & PLAN Erik Page [age] y.o. male with medical history significant for [facility] presents for a follow up visit.   # Metastatic Castrate Sensitive Prostate Cancer  --continue lupron 30mg  subq q 4 months indefinitely  --transition from Zytiga to enzalutamide 160mg  PO daily due to cost. Was able to get approval to return to Zytiga due to faillure of enzalutamide.  -- Labs today show white blood cell count 4.1, Hgb 12.6, MCV 93.3, Plt 175 -- Last PSA 23.52 months ago.  He has transition to [place] therapy since that time. --If PSA continues to rise on Zytiga therapy would need to consider alternative treatments.  Would recommend consideration of Pluvicto versus chemotherapy --Will administer Xgeva and Lupron every 4 months. -- Return to clinic in 4 months time for labs/injection  No orders of the defined types were placed in this encounter.   All questions were answered. The patient knows to call the clinic with any problems, questions or concerns.  A total of more than 30 minutes were spent on this encounter with face-to-face time and non-face-to-face time, including preparing to see the patient, ordering tests and/or medications, counseling  the patient and coordination of care as outlined above.   Erik Barns, MD Department of Hematology/Oncology [facility] at [facility] Phone: [phone] Pager: [phone] Email: [email or web address]  06/15/2023 6:39 PM

## 2023-06-11 ENCOUNTER — Inpatient Hospital Stay: Payer: Medicare Other | Attending: Hematology and Oncology | Admitting: Hematology and Oncology

## 2023-06-11 ENCOUNTER — Inpatient Hospital Stay: Payer: Medicare Other | Attending: Oncology

## 2023-06-11 ENCOUNTER — Inpatient Hospital Stay: Payer: Medicare Other

## 2023-06-11 VITALS — BP 118/73 | HR 74 | Temp 98.1°F | Resp 14 | Wt 237.6 lb

## 2023-06-11 DIAGNOSIS — C61 Malignant neoplasm of prostate: Secondary | ICD-10-CM | POA: Diagnosis present

## 2023-06-11 DIAGNOSIS — C7951 Secondary malignant neoplasm of bone: Secondary | ICD-10-CM | POA: Diagnosis present

## 2023-06-11 LAB — CBC WITH DIFFERENTIAL (CANCER CENTER ONLY)
Abs Immature Granulocytes: 0.01 10*3/uL (ref 0.00–0.07)
Basophils Absolute: 0 10*3/uL (ref 0.0–0.1)
Basophils Relative: 1 %
Eosinophils Absolute: 0.1 10*3/uL (ref 0.0–0.5)
Eosinophils Relative: 2 %
HCT: 37.3 % — ABNORMAL LOW (ref 39.0–52.0)
Hemoglobin: 12.6 g/dL — ABNORMAL LOW (ref 13.0–17.0)
Immature Granulocytes: 0 %
Lymphocytes Relative: 26 %
Lymphs Abs: 1 10*3/uL (ref 0.7–4.0)
MCH: 31.5 pg (ref 26.0–34.0)
MCHC: 33.8 g/dL (ref 30.0–36.0)
MCV: 93.3 fL (ref 80.0–100.0)
Monocytes Absolute: 0.2 10*3/uL (ref 0.1–1.0)
Monocytes Relative: 5 %
Neutro Abs: 2.7 10*3/uL (ref 1.7–7.7)
Neutrophils Relative %: 66 %
Platelet Count: 175 10*3/uL (ref 150–400)
RBC: 4 MIL/uL — ABNORMAL LOW (ref 4.22–5.81)
RDW: 12.2 % (ref 11.5–15.5)
WBC Count: 4.1 10*3/uL (ref 4.0–10.5)
nRBC: 0 % (ref 0.0–0.2)

## 2023-06-11 LAB — CMP (CANCER CENTER ONLY)
ALT: 13 U/L (ref 0–44)
AST: 13 U/L — ABNORMAL LOW (ref 15–41)
Albumin: 3.9 g/dL (ref 3.5–5.0)
Alkaline Phosphatase: 84 U/L (ref 38–126)
Anion gap: 5 (ref 5–15)
BUN: 15 mg/dL (ref 8–23)
CO2: 32 mmol/L (ref 22–32)
Calcium: 8.9 mg/dL (ref 8.9–10.3)
Chloride: 104 mmol/L (ref 98–111)
Creatinine: 1.01 mg/dL (ref 0.61–1.24)
GFR, Estimated: >60 mL/min (ref >60)
Glucose, Bld: 138 mg/dL — ABNORMAL HIGH (ref 70–99)
Potassium: 3.3 mmol/L — ABNORMAL LOW (ref 3.5–5.1)
Sodium: 141 mmol/L (ref 135–145)
Total Bilirubin: 1.8 mg/dL — ABNORMAL HIGH (ref 0.3–1.2)
Total Protein: 6.2 g/dL — ABNORMAL LOW (ref 6.5–8.1)

## 2023-06-11 MED ORDER — DENOSUMAB 120 MG/1.7ML ~~LOC~~ SOLN
120.0000 mg | Freq: Once | SUBCUTANEOUS | Status: AC
  Administered 2023-06-11: 120 mg via SUBCUTANEOUS
  Filled 2023-06-11: qty 1.7

## 2023-06-11 NOTE — Patient Instructions (Signed)
Denosumab Injection (Oncology) What is this medication? DENOSUMAB (den oh SUE mab) prevents weakened bones caused by cancer. It may also be used to treat noncancerous bone tumors that cannot be removed by surgery. It can also be used to treat high calcium levels in the blood caused by cancer. It works by blocking a protein that causes bones to break down quickly. This slows down the release of calcium from bones, which lowers calcium levels in your blood. It also makes your bones stronger and less likely to break (fracture). This medicine may be used for other purposes; ask your health care provider or pharmacist if you have questions. COMMON BRAND NAME(S): XGEVA What should I tell my care team before I take this medication? They need to know if you have any of these conditions: Dental disease Having surgery or tooth extraction Infection Kidney disease Low levels of calcium or vitamin D in the blood Malnutrition On hemodialysis Skin conditions or sensitivity Thyroid or parathyroid disease An unusual reaction to denosumab, other medications, foods, dyes, or preservatives Pregnant or trying to get pregnant Breast-feeding How should I use this medication? This medication is for injection under the skin. It is given by your care team in a hospital or clinic setting. A special MedGuide will be given to you before each treatment. Be sure to read this information carefully each time. Talk to your care team about the use of this medication in children. While it may be prescribed for children as young as 13 years for selected conditions, precautions do apply. Overdosage: If you think you have taken too much of this medicine contact a poison control center or emergency room at once. NOTE: This medicine is only for you. Do not share this medicine with others. What if I miss a dose? Keep appointments for follow-up doses. It is important not to miss your dose. Call your care team if you are unable to  keep an appointment. What may interact with this medication? Do not take this medication with any of the following: Other medications containing denosumab This medication may also interact with the following: Medications that lower your chance of fighting infection Steroid medications, such as prednisone or cortisone This list may not describe all possible interactions. Give your health care provider a list of all the medicines, herbs, non-prescription drugs, or dietary supplements you use. Also tell them if you smoke, drink alcohol, or use illegal drugs. Some items may interact with your medicine. What should I watch for while using this medication? Your condition will be monitored carefully while you are receiving this medication. You may need blood work while taking this medication. This medication may increase your risk of getting an infection. Call your care team for advice if you get a fever, chills, sore throat, or other symptoms of a cold or flu. Do not treat yourself. Try to avoid being around people who are sick. You should make sure you get enough calcium and vitamin D while you are taking this medication, unless your care team tells you not to. Discuss the foods you eat and the vitamins you take with your care team. Some people who take this medication have severe bone, joint, or muscle pain. This medication may also increase your risk for jaw problems or a broken thigh bone. Tell your care team right away if you have severe pain in your jaw, bones, joints, or muscles. Tell your care team if you have any pain that does not go away or that gets worse. Talk  to your care team if you may be pregnant. Serious birth defects can occur if you take this medication during pregnancy and for 5 months after the last dose. You will need a negative pregnancy test before starting this medication. Contraception is recommended while taking this medication and for 5 months after the last dose. Your care team  can help you find the option that works for you. What side effects may I notice from receiving this medication? Side effects that you should report to your care team as soon as possible: Allergic reactions--skin rash, itching, hives, swelling of the face, lips, tongue, or throat Bone, joint, or muscle pain Low calcium level--muscle pain or cramps, confusion, tingling, or numbness in the hands or feet Osteonecrosis of the jaw--pain, swelling, or redness in the mouth, numbness of the jaw, poor healing after dental work, unusual discharge from the mouth, visible bones in the mouth Side effects that usually do not require medical attention (report to your care team if they continue or are bothersome): Cough Diarrhea Fatigue Headache Nausea This list may not describe all possible side effects. Call your doctor for medical advice about side effects. You may report side effects to FDA at 1-800-FDA-1088. Where should I keep my medication? This medication is given in a hospital or clinic. It will not be stored at home. NOTE: This sheet is a summary. It may not cover all possible information. If you have questions about this medicine, talk to your doctor, pharmacist, or health care provider.  2024 Elsevier/Gold Standard (2022-01-30 00:00:00)

## 2023-06-12 ENCOUNTER — Encounter: Payer: Self-pay | Admitting: Hematology and Oncology

## 2023-06-12 ENCOUNTER — Other Ambulatory Visit (HOSPITAL_COMMUNITY): Payer: Self-pay

## 2023-06-12 LAB — PROSTATE-SPECIFIC AG, SERUM (LABCORP): Prostate Specific Ag, Serum: 50.3 ng/mL — ABNORMAL HIGH (ref 0.0–4.0)

## 2023-06-12 LAB — TESTOSTERONE: Testosterone: <3 ng/dL — ABNORMAL LOW (ref 264–916)

## 2023-06-15 ENCOUNTER — Encounter: Payer: Self-pay | Admitting: Hematology and Oncology

## 2023-06-16 ENCOUNTER — Encounter: Payer: Self-pay | Admitting: Hematology and Oncology

## 2023-06-16 ENCOUNTER — Other Ambulatory Visit: Payer: Self-pay

## 2023-06-17 ENCOUNTER — Telehealth: Payer: Self-pay | Admitting: *Deleted

## 2023-06-17 NOTE — Telephone Encounter (Signed)
-----   Message from Nurse Lorra Hals sent at 06/16/2023  5:43 PM EDT ----- Please contact patient with results and directions  Thanks ----- Message ----- From: Kyra Searles, RN Sent: 06/16/2023  12:07 PM EDT To: Tonny Bollman, RN   ----- Message ----- From: Jaci Standard, MD Sent: 06/15/2023   6:40 PM EDT To: Kyra Searles, RN  Please let Mr. [name] know that his PSA has increased to 50.3.  I do not believe his Roosvelt Maser has had adequate time to work, and therefore I would recommend repeat labs in 4 weeks time in order to assure that this number is going down rather than continuing to trend up.  We have a clinic visit scheduled in November 2024.  Please request a lab only visit in 4 weeks time. ----- Message ----- From: Leory Plowman, Lab In [place] Sent: 06/11/2023  10:27 AM EDT To: Jaci Standard, MD

## 2023-06-17 NOTE — Telephone Encounter (Signed)
LM to call Dr Derek Mound nurse

## 2023-06-19 ENCOUNTER — Telehealth: Payer: Self-pay | Admitting: Hematology and Oncology

## 2023-06-19 ENCOUNTER — Telehealth: Payer: Self-pay

## 2023-06-19 NOTE — Telephone Encounter (Signed)
Pt called to inform him of his PSA results have increased to 50.3 but Dr. Leonides Schanz believes it is from his Roosvelt Maser not having an adequate amount of time to work. Pt made aware that Dr. Leonides Schanz suggests getting repeat labs in 4 weeks time in order to ensure that his levels are not trending up. Pt states that he would like to wait to come in for labs for his scheduled for November. He states that he wishes to just come in at that time and not to schedule a lab appt in 4 weeks.    This RN stated understanding. Message sent to scheduling to schedule MD appt and to adjust lab and inj appt.

## 2023-07-08 ENCOUNTER — Other Ambulatory Visit: Payer: Self-pay

## 2023-07-08 ENCOUNTER — Other Ambulatory Visit (HOSPITAL_COMMUNITY): Payer: Self-pay

## 2023-07-08 ENCOUNTER — Encounter: Payer: Self-pay | Admitting: Hematology and Oncology

## 2023-07-08 ENCOUNTER — Other Ambulatory Visit: Payer: Self-pay | Admitting: Hematology and Oncology

## 2023-07-08 DIAGNOSIS — C61 Malignant neoplasm of prostate: Secondary | ICD-10-CM

## 2023-07-08 MED ORDER — ABIRATERONE ACETATE 250 MG PO TABS
1000.0000 mg | ORAL_TABLET | Freq: Every day | ORAL | 2 refills | Status: DC
  Filled 2023-07-08: qty 120, 30d supply, fill #0
  Filled 2023-08-06: qty 120, 30d supply, fill #1
  Filled 2023-09-04: qty 120, 30d supply, fill #2

## 2023-07-08 NOTE — Progress Notes (Signed)
Specialty Pharmacy Refill Coordination Note  Mcihael Hinderman is a 72 y.o. male contacted today regarding refills of specialty medication(s) Abiraterone Acetate   Patient requested Delivery   Delivery date: 07/14/23   Verified address: [address]   [place] [address]   Medication will be filled on 07/11/23 pending refill request.

## 2023-07-11 ENCOUNTER — Other Ambulatory Visit: Payer: Self-pay

## 2023-07-11 ENCOUNTER — Encounter: Payer: Self-pay | Admitting: Hematology and Oncology

## 2023-07-31 ENCOUNTER — Other Ambulatory Visit: Payer: Self-pay

## 2023-08-04 ENCOUNTER — Other Ambulatory Visit (HOSPITAL_COMMUNITY): Payer: Self-pay

## 2023-08-06 ENCOUNTER — Other Ambulatory Visit: Payer: Self-pay

## 2023-08-06 ENCOUNTER — Inpatient Hospital Stay: Payer: Medicare Other

## 2023-08-06 ENCOUNTER — Other Ambulatory Visit: Payer: Self-pay | Admitting: Hematology and Oncology

## 2023-08-06 ENCOUNTER — Inpatient Hospital Stay: Payer: Medicare Other | Attending: Hematology and Oncology

## 2023-08-06 ENCOUNTER — Inpatient Hospital Stay (HOSPITAL_BASED_OUTPATIENT_CLINIC_OR_DEPARTMENT_OTHER): Payer: Medicare Other | Admitting: Hematology and Oncology

## 2023-08-06 ENCOUNTER — Encounter: Payer: Self-pay | Admitting: Hematology and Oncology

## 2023-08-06 VITALS — BP 139/88 | HR 77 | Temp 97.4°F | Resp 14 | Wt 239.5 lb

## 2023-08-06 DIAGNOSIS — C61 Malignant neoplasm of prostate: Secondary | ICD-10-CM | POA: Insufficient documentation

## 2023-08-06 DIAGNOSIS — C7951 Secondary malignant neoplasm of bone: Secondary | ICD-10-CM | POA: Diagnosis present

## 2023-08-06 DIAGNOSIS — D649 Anemia, unspecified: Secondary | ICD-10-CM

## 2023-08-06 LAB — CMP (CANCER CENTER ONLY)
ALT: 12 U/L (ref 0–44)
AST: 11 U/L — ABNORMAL LOW (ref 15–41)
Albumin: 3.9 g/dL (ref 3.5–5.0)
Alkaline Phosphatase: 130 U/L — ABNORMAL HIGH (ref 38–126)
Anion gap: 7 (ref 5–15)
BUN: 14 mg/dL (ref 8–23)
CO2: 34 mmol/L — ABNORMAL HIGH (ref 22–32)
Calcium: 9.3 mg/dL (ref 8.9–10.3)
Chloride: 102 mmol/L (ref 98–111)
Creatinine: 0.91 mg/dL (ref 0.61–1.24)
GFR, Estimated: >60 mL/min (ref >60)
Glucose, Bld: 141 mg/dL — ABNORMAL HIGH (ref 70–99)
Potassium: 3.3 mmol/L — ABNORMAL LOW (ref 3.5–5.1)
Sodium: 143 mmol/L (ref 135–145)
Total Bilirubin: 1.7 mg/dL — ABNORMAL HIGH (ref <1.2)
Total Protein: 6.5 g/dL (ref 6.5–8.1)

## 2023-08-06 LAB — CBC WITH DIFFERENTIAL (CANCER CENTER ONLY)
Abs Immature Granulocytes: 0.02 10*3/uL (ref 0.00–0.07)
Basophils Absolute: 0 10*3/uL (ref 0.0–0.1)
Basophils Relative: 1 %
Eosinophils Absolute: 0.1 10*3/uL (ref 0.0–0.5)
Eosinophils Relative: 2 %
HCT: 37.9 % — ABNORMAL LOW (ref 39.0–52.0)
Hemoglobin: 13.1 g/dL (ref 13.0–17.0)
Immature Granulocytes: 1 %
Lymphocytes Relative: 29 %
Lymphs Abs: 1.3 10*3/uL (ref 0.7–4.0)
MCH: 32.2 pg (ref 26.0–34.0)
MCHC: 34.6 g/dL (ref 30.0–36.0)
MCV: 93.1 fL (ref 80.0–100.0)
Monocytes Absolute: 0.2 10*3/uL (ref 0.1–1.0)
Monocytes Relative: 6 %
Neutro Abs: 2.7 10*3/uL (ref 1.7–7.7)
Neutrophils Relative %: 61 %
Platelet Count: 205 10*3/uL (ref 150–400)
RBC: 4.07 MIL/uL — ABNORMAL LOW (ref 4.22–5.81)
RDW: 12.2 % (ref 11.5–15.5)
WBC Count: 4.3 10*3/uL (ref 4.0–10.5)
nRBC: 0 % (ref 0.0–0.2)

## 2023-08-06 MED ORDER — LEUPROLIDE ACETATE (4 MONTH) 30 MG ~~LOC~~ KIT
30.0000 mg | PACK | Freq: Once | SUBCUTANEOUS | Status: AC
  Administered 2023-08-06: 30 mg via SUBCUTANEOUS
  Filled 2023-08-06: qty 30

## 2023-08-06 MED ORDER — DENOSUMAB 120 MG/1.7ML ~~LOC~~ SOLN
120.0000 mg | Freq: Once | SUBCUTANEOUS | Status: AC
  Administered 2023-08-06: 120 mg via SUBCUTANEOUS
  Filled 2023-08-06: qty 1.7

## 2023-08-06 NOTE — Progress Notes (Signed)
Specialty Pharmacy Ongoing Clinical Assessment Note  Erik Page is a 72 y.o. male who is being followed by the specialty pharmacy service for RxSp Oncology   Patient's specialty medication(s) reviewed today: Abiraterone Acetate   Missed doses in the last 4 weeks: 0   Patient/Caregiver did not have any additional questions or concerns.   Therapeutic benefit summary: Patient is achieving benefit   Adverse events/side effects summary: Experienced adverse events/side effects (Mild, tolerable hot flashes)   Patient's therapy is appropriate to: Continue    Goals Addressed             This Visit's Progress    Slow Disease Progression       Patient is not on track and no change. Patient will maintain adherence and adhere to provider and/or lab appointments. PSA has been rising on Zytiga to 50.3 on 06/11/23. Per provider notes from today, if PSA continues to rise will consider therapy change.          Follow up:  6 months  Otto Herb Specialty Pharmacist

## 2023-08-06 NOTE — Progress Notes (Signed)
Specialty Pharmacy Refill Coordination Note  Erik Page is a 72 y.o. male contacted today regarding refills of specialty medication(s) Abiraterone Acetate   Patient requested Delivery   Delivery date: 08/12/23   Verified address: [address]   [place] [address]   Medication will be filled on 08/11/23.  Confirmed $140 copay with patient.

## 2023-08-06 NOTE — Progress Notes (Signed)
[facility], [facility] [phone]   [phone]  PROGRESS NOTE  Patient Care Team: Lorelei Pont, DO as PCP - General (Family Medicine)  Hematological/Oncological History # Metastatic Castrate Sensitive Prostate Cancer  09/30/2021: percutaneous nephrostomy tube placement and retroperitoneal lymph node biopsy  10/01/2021: Deborra Medina 240 mg  09/2021: radiation therapy to the lumbar spine after receiving 30 Gray in 10 fractions  11/01/2021: started Eligard 30 mg every 4 months and Zytiga 1000 mg daily with prednisone 5 mg daily  09/25/2022: last visit with Dr. Clelia Croft 10/29/2022: transition care to Dr. Leonides Schanz. Insurance no longer assisting with Zytiga 1000 mg daily, converting enzalutamide 160 mg PO daily.    Interval History:  Erik [age] y.o. Page with medical history significant for [facility] presents for a follow up visit. The patient's last visit was on 06/11/2023. In the interim since the last visit he has continued on zytiga therapy.   On exam today Erik Page reports he has been having a lot of stress at home but that physically his body has been "okay".  He reports that the hot flashes he has have been "killer".  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  He reports his appetite has been so-so but his energy levels have been good.  He notes that his energy does feel improved from prior.  Overall he feels like he is good shape.  He notes that he is not having any runny nose, sore throat, or cough.  We briefly discussed neck step in the event his PSA were to continue rising.  We discussed Pluvicto versus chemotherapy.  He voices understanding and was dealing and able to proceed with treatment at this time and scan if necessary.  Otherwise he denies any fevers, chills, sweats, nausea, vomiting or diarrhea..  Overall he feels well and is willing and able to proceed with therapy at this time.  A full 10 point ROS was otherwise  negative.   MEDICAL HISTORY:  Past Medical History:  Diagnosis Date   HTN (hypertension)    Myocardial infarction [facility])     SURGICAL HISTORY: Past Surgical History:  Procedure Laterality Date   CARDIAC CATHETERIZATION N/A 08/15/2015   Procedure: Left Heart Cath and Coronary Angiography;  Surgeon: Marykay Lex, MD;  Location: [facility] INVASIVE CV LAB;  Service: Cardiovascular;  Laterality: N/A;   CARDIAC CATHETERIZATION N/A 08/15/2015   Procedure: Left Heart Cath and Coronary Angiography;  Surgeon: Marykay Lex, MD;  Location: [facility] INVASIVE CV LAB;  Service: Cardiovascular;  Laterality: N/A;   CARDIAC CATHETERIZATION N/A 08/15/2015   Procedure: Coronary Stent Intervention;  Surgeon: Marykay Lex, MD;  Location: [facility] INVASIVE CV LAB;  Service: Cardiovascular;  Laterality: N/A;   IR CONVERT RIGHT NEPHROSTOMY TO NEPHROURETERAL CATH  11/12/2021   IR EXT NEPHROURETERAL CATH EXCHANGE  01/07/2022   IR NEPHROSTOMY EXCHANGE RIGHT  02/20/2022   IR NEPHROSTOMY PLACEMENT RIGHT  10/01/2021   TONSILLECTOMY      SOCIAL HISTORY: Social History   Socioeconomic History   Marital status: Married    Spouse name: Not on file   Number of children: Not on file   Years of education: Not on file   Highest education level: Not on file  Occupational History   Not on file  Tobacco Use   Smoking status: Never   Smokeless tobacco: Never  Vaping Use   Vaping status: Never Used  Substance and Sexual Activity   Alcohol use: No  Alcohol/week: 0.0 standard drinks of alcohol   Drug use: No   Sexual activity: Not on file  Other Topics Concern   Not on file  Social History Narrative   Works as a Pensions consultant; 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  MEDICATIONS:  Current Outpatient Medications  Medication Sig Dispense Refill   abiraterone acetate (ZYTIGA) 250 MG tablet Take 4 tablets (1,000 mg total) by mouth daily. Take on an empty stomach 1 hour before or 2 hours after a meal 120 tablet 2   aspirin EC 81 MG EC tablet Take 1 tablet (81 mg total) by mouth daily.     atorvastatin (LIPITOR) 80 MG tablet Take 1 tablet (80 mg total) by mouth daily. 90 tablet 1   calcium-vitamin D (OSCAL WITH D) 500-5 MG-MCG tablet Take 1 tablet by mouth 2 (two) times daily. 90 tablet 3   feeding supplement (ENSURE ENLIVE / ENSURE PLUS) LIQD Take 237 mLs by mouth 2 (two) times daily between meals. 237 mL 12   ibuprofen (ADVIL) 600 MG tablet Take 1 tablet (600 mg total) by mouth 3 (three) times daily. 30 tablet 0   metoprolol tartrate (LOPRESSOR) 25 MG tablet TAKE 1 TABLET BY MOUTH TWICE A DAY 14 tablet 0   nitroGLYCERIN (NITROSTAT) 0.4 MG SL tablet DISSOLVE 1 TAB UNDER TONGUE FOR CHEST PAIN - IF PAIN REMAINS AFTER 5 MIN, CALL 911 AND REPEAT DOSE. MAX 3 TABS IN 15 MINUTES (Patient taking differently: 0.4 mg every 5 (five) minutes as needed for chest pain.) 25 tablet 3   ondansetron (ZOFRAN) 4 MG tablet Take 1 tablet (4 mg total) by mouth every 6 (six) hours as needed for nausea. 20 tablet 0   oxyCODONE (OXY IR/ROXICODONE) 5 MG immediate release tablet Take 2 tablets (10 mg total) by mouth every 4 (four) hours as needed for severe pain. 120 tablet 0   predniSONE (DELTASONE) 5 MG tablet Take 1 tablet (5 mg total) by mouth daily with breakfast. 90 tablet 1   senna-docusate (SENOKOT-S) 8.6-50 MG tablet Take 1 tablet by mouth at bedtime as needed for mild constipation. 30 tablet 0   No current facility-administered medications for this visit.    REVIEW OF SYSTEMS:   Constitutional: ( - ) fevers, ( - )  chills , ( - ) night  sweats Eyes: ( - ) blurriness of vision, ( - ) double vision, ( - ) watery eyes Ears, nose, mouth, throat, and face: ( - ) mucositis, ( - ) sore throat Respiratory: ( - ) cough, ( - ) dyspnea, ( - ) wheezes Cardiovascular: ( - ) palpitation, ( - ) chest discomfort, ( - ) lower extremity swelling Gastrointestinal:  ( - ) nausea, ( - ) heartburn, ( - ) change in bowel habits Skin: ( - ) abnormal skin rashes Lymphatics: ( - ) new lymphadenopathy, ( - ) easy bruising Neurological: ( - ) numbness, ( - ) tingling, ( - ) new weaknesses Behavioral/Psych: ( - ) mood change, ( - ) new changes  All other  systems were reviewed with the patient and are negative.  PHYSICAL EXAMINATION: ECOG PERFORMANCE STATUS: 0 - Asymptomatic  Vitals:   08/06/23 0842  BP: 139/88  Pulse: 77  Resp: 14  Temp: (!) 97.4 F (36.3 C)  SpO2: 100%    Filed Weights   08/06/23 0842  Weight: 239 lb 8 oz (108.6 kg)     GENERAL: Well-appearing elderly African-American Page, alert, no distress and comfortable SKIN: skin color, texture, turgor are normal, no rashes or significant lesions EYES: conjunctiva are pink and non-injected, sclera clear LUNGS: clear to auscultation and percussion with normal breathing effort HEART: regular rate & rhythm and no murmurs and no lower extremity edema Musculoskeletal: no cyanosis of digits and no clubbing  PSYCH: alert & oriented x 3, fluent speech NEURO: no focal motor/sensory deficits  LABORATORY DATA:  I have reviewed the data as listed    Latest Ref Rng & Units 08/06/2023    8:14 AM 06/11/2023   10:09 AM 04/16/2023    9:07 AM  CBC  WBC 4.0 - 10.5 K/uL 4.3  4.1  3.2   Hemoglobin 13.0 - 17.0 g/dL 08.6  57.8  46.9   Hematocrit 39.0 - 52.0 % 37.9  37.3  37.7   Platelets 150 - 400 K/uL 205  175  187        Latest Ref Rng & Units 08/06/2023    8:14 AM 06/11/2023   10:09 AM 04/16/2023    9:07 AM  CMP  Glucose 70 - 99 mg/dL 629  528  413   BUN 8 - 23 mg/dL 14  15  17     Creatinine 0.61 - 1.24 mg/dL 2.44  0.10  2.72   Sodium 135 - 145 mmol/L 143  141  141   Potassium 3.5 - 5.1 mmol/L 3.3  3.3  4.1   Chloride 98 - 111 mmol/L 102  104  106   CO2 22 - 32 mmol/L 34  32  30   Calcium 8.9 - 10.3 mg/dL 9.3  8.9  9.1   Total Protein 6.5 - 8.1 g/dL 6.5  6.2  6.2   Total Bilirubin <1.2 mg/dL 1.7  1.8  1.1   Alkaline Phos 38 - 126 U/L 130  84  79   AST 15 - 41 U/L 11  13  13    ALT 0 - 44 U/L 12  13  10      RADIOGRAPHIC STUDIES: No results found.  ASSESSMENT & PLAN Erik [age] y.o. Page with medical history significant for [facility] presents for a follow up visit.   # Metastatic Castrate Sensitive Prostate Cancer  --continue lupron 30mg  subq q 4 months indefinitely  --transition from Zytiga to enzalutamide 160mg  PO daily due to cost. Was able to get approval to return to Zytiga due to faillure of enzalutamide.  -- Labs today show white blood cell count 4.3, hemoglobin 13.1, MCV 93.1, platelets 205 -- Last PSA 50.3 on 06/11/2023.  --If PSA continues to rise on Zytiga therapy would need to consider alternative treatments.  Would recommend consideration of Pluvicto versus chemotherapy --Will administer Xgeva and Lupron every 4 months. -- Return to clinic in 4 months time for labs/injection  No orders of the defined types were placed in this encounter.   All questions were answered. The patient knows to call the clinic with any problems, questions or concerns.  A total of more than 30 minutes were spent on this encounter with face-to-face time and non-face-to-face time, including preparing  to see the patient, ordering tests and/or medications, counseling the patient and coordination of care as outlined above.   Erik Barns, MD Department of Hematology/Oncology [facility] Phone: [phone] Pager: [phone] Email: [email or web address]  08/06/2023 4:43 PM

## 2023-08-07 ENCOUNTER — Other Ambulatory Visit (HOSPITAL_COMMUNITY): Payer: Self-pay

## 2023-08-07 ENCOUNTER — Telehealth: Payer: Self-pay | Admitting: Pharmacy Technician

## 2023-08-07 ENCOUNTER — Other Ambulatory Visit: Payer: Self-pay | Admitting: Hematology and Oncology

## 2023-08-07 ENCOUNTER — Telehealth: Payer: Self-pay | Admitting: *Deleted

## 2023-08-07 ENCOUNTER — Encounter: Payer: Self-pay | Admitting: Hematology and Oncology

## 2023-08-07 DIAGNOSIS — C61 Malignant neoplasm of prostate: Secondary | ICD-10-CM

## 2023-08-07 LAB — TESTOSTERONE: Testosterone: <3 ng/dL — ABNORMAL LOW (ref 264–916)

## 2023-08-07 LAB — PROSTATE-SPECIFIC AG, SERUM (LABCORP): Prostate Specific Ag, Serum: 102 ng/mL — ABNORMAL HIGH (ref 0.0–4.0)

## 2023-08-07 NOTE — Telephone Encounter (Signed)
Oral Oncology Patient Advocate Encounter   Was successful in securing patient a $7,500 grant from [employer] to provide copayment coverage for abiraterone.  This will keep the out of pocket expense at $0.     I have spoken with the patient.    The billing information is as follows and has been shared with [facility].   Member ID: [id number] Group ID: [facility] RxBin: [id number] PCN: [id number] Dates of Eligibility: 08/05/23 through 08/04/24  Fund name:  Metastatic Prostate.   Jinger Neighbors, CPhT-Adv Oncology Pharmacy Patient Advocate [facility] Direct Number: [phone]  Fax: [phone]

## 2023-08-07 NOTE — Telephone Encounter (Signed)
TCT patient regarding recent lab results.. No answer. Was able to leave vm message for pt to return this call at his convenience @ [phone]

## 2023-08-07 NOTE — Telephone Encounter (Signed)
-----   Message from Ulysees Barns IV sent at 08/07/2023  8:27 AM EST ----- Please let Mr. Allen know that his PSA increased to >100. I have ordered a PSMA scan to help [place] determine what the next best steps will be. We did discuss Pluvicto vs chemotherapy previously, this scan will help [place] make that decision. ----- Message ----- From: Interface, Lab In [place] Sent: 08/06/2023   8:26 AM EST To: Jaci Standard, MD

## 2023-08-11 NOTE — Telephone Encounter (Signed)
Received call back from pt. Advised that his PSA increased to >100. Dr. Leonides Schanz ordered a PSMA scan to help [place] determine what the next best steps will be. Dr. Leonides Schanz discuss Pluvicto vs chemotherapy previously, this scan will help [place] make that decision. Pt states the scan has been scheduled for the day after Thanksgiving. We will see him the following week. Scheduling message sent for that visit.

## 2023-08-22 ENCOUNTER — Encounter (HOSPITAL_COMMUNITY)
Admission: RE | Admit: 2023-08-22 | Discharge: 2023-08-22 | Disposition: A | Discharge status: Home / Self Care | Payer: Medicare Other | Source: Ambulatory Visit | Attending: Hematology and Oncology | Admitting: Hematology and Oncology

## 2023-08-22 DIAGNOSIS — C61 Malignant neoplasm of prostate: Secondary | ICD-10-CM | POA: Diagnosis present

## 2023-08-22 MED ORDER — FLOTUFOLASTAT F 18 GALLIUM 296-5846 MBQ/ML IV SOLN
8.2200 | Freq: Once | INTRAVENOUS | Status: AC
  Administered 2023-08-22: 8.22 via INTRAVENOUS

## 2023-08-27 ENCOUNTER — Inpatient Hospital Stay: Payer: Medicare Other

## 2023-08-27 ENCOUNTER — Other Ambulatory Visit: Payer: Self-pay | Admitting: Hematology and Oncology

## 2023-08-27 ENCOUNTER — Inpatient Hospital Stay: Payer: Medicare Other | Attending: Hematology and Oncology | Admitting: Hematology and Oncology

## 2023-08-27 ENCOUNTER — Inpatient Hospital Stay: Payer: Medicare Other | Attending: Hematology and Oncology

## 2023-08-27 ENCOUNTER — Other Ambulatory Visit: Payer: Self-pay | Admitting: *Deleted

## 2023-08-27 VITALS — BP 136/85 | HR 79 | Temp 97.9°F | Resp 18 | Ht 74.0 in | Wt 241.5 lb

## 2023-08-27 DIAGNOSIS — Z7952 Long term (current) use of systemic steroids: Secondary | ICD-10-CM | POA: Insufficient documentation

## 2023-08-27 DIAGNOSIS — C61 Malignant neoplasm of prostate: Secondary | ICD-10-CM

## 2023-08-27 DIAGNOSIS — C7951 Secondary malignant neoplasm of bone: Secondary | ICD-10-CM | POA: Diagnosis present

## 2023-08-27 LAB — CMP (CANCER CENTER ONLY)
ALT: 16 U/L (ref 0–44)
AST: 13 U/L — ABNORMAL LOW (ref 15–41)
Albumin: 4 g/dL (ref 3.5–5.0)
Alkaline Phosphatase: 132 U/L — ABNORMAL HIGH (ref 38–126)
Anion gap: 7 (ref 5–15)
BUN: 16 mg/dL (ref 8–23)
CO2: 34 mmol/L — ABNORMAL HIGH (ref 22–32)
Calcium: 9.1 mg/dL (ref 8.9–10.3)
Chloride: 101 mmol/L (ref 98–111)
Creatinine: 0.87 mg/dL (ref 0.61–1.24)
GFR, Estimated: >60 mL/min (ref >60)
Glucose, Bld: 157 mg/dL — ABNORMAL HIGH (ref 70–99)
Potassium: 3.8 mmol/L (ref 3.5–5.1)
Sodium: 142 mmol/L (ref 135–145)
Total Bilirubin: 1.5 mg/dL — ABNORMAL HIGH (ref <1.2)
Total Protein: 6.4 g/dL — ABNORMAL LOW (ref 6.5–8.1)

## 2023-08-27 LAB — CBC WITH DIFFERENTIAL (CANCER CENTER ONLY)
Abs Immature Granulocytes: 0.02 10*3/uL (ref 0.00–0.07)
Basophils Absolute: 0 10*3/uL (ref 0.0–0.1)
Basophils Relative: 0 %
Eosinophils Absolute: 0.1 10*3/uL (ref 0.0–0.5)
Eosinophils Relative: 1 %
HCT: 38.7 % — ABNORMAL LOW (ref 39.0–52.0)
Hemoglobin: 12.9 g/dL — ABNORMAL LOW (ref 13.0–17.0)
Immature Granulocytes: 0 %
Lymphocytes Relative: 20 %
Lymphs Abs: 1 10*3/uL (ref 0.7–4.0)
MCH: 31.7 pg (ref 26.0–34.0)
MCHC: 33.3 g/dL (ref 30.0–36.0)
MCV: 95.1 fL (ref 80.0–100.0)
Monocytes Absolute: 0.3 10*3/uL (ref 0.1–1.0)
Monocytes Relative: 7 %
Neutro Abs: 3.5 10*3/uL (ref 1.7–7.7)
Neutrophils Relative %: 72 %
Platelet Count: 180 10*3/uL (ref 150–400)
RBC: 4.07 MIL/uL — ABNORMAL LOW (ref 4.22–5.81)
RDW: 12.9 % (ref 11.5–15.5)
WBC Count: 4.9 10*3/uL (ref 4.0–10.5)
nRBC: 0 % (ref 0.0–0.2)

## 2023-08-27 MED ORDER — PROCHLORPERAZINE MALEATE 10 MG PO TABS: 10.0000 mg | ORAL_TABLET | Freq: Four times a day (QID) | ORAL | 0 refills | Status: AC | PRN

## 2023-08-27 MED ORDER — IBUPROFEN 800 MG PO TABS: 800.0000 mg | ORAL_TABLET | Freq: Three times a day (TID) | ORAL | 1 refills | Status: DC

## 2023-08-27 MED ORDER — ONDANSETRON HCL 8 MG PO TABS: 8.0000 mg | ORAL_TABLET | Freq: Three times a day (TID) | ORAL | 0 refills | Status: DC | PRN

## 2023-08-27 MED ORDER — METOPROLOL TARTRATE 25 MG PO TABS: 25.0000 mg | ORAL_TABLET | Freq: Two times a day (BID) | ORAL | 0 refills | Status: DC

## 2023-08-27 NOTE — Progress Notes (Signed)
[facility] [phone]   [phone]  PROGRESS NOTE  Patient Care Team: Lorelei Pont, DO as PCP - General (Family Medicine)  Hematological/Oncological History # Metastatic Castrate Sensitive Prostate Cancer  09/30/2021: percutaneous nephrostomy tube placement and retroperitoneal lymph node biopsy  10/01/2021: Deborra Medina 240 mg  09/2021: radiation therapy to the lumbar spine after receiving 30 Gray in 10 fractions  11/01/2021: started Eligard 30 mg every 4 months and Zytiga 1000 mg daily with prednisone 5 mg daily  09/25/2022: last visit with Dr. Clelia Croft 10/29/2022: transition care to Dr. Leonides Schanz. Insurance no longer assisting with Zytiga 1000 mg daily, converting enzalutamide 160 mg PO daily.    Interval History:  Erik Page [age] y.o. male with medical history significant for [facility] presents for a follow up visit. The patient's last visit was on 08/06/2023. In the interim since the last visit he has continued on zytiga therapy.   On exam today Erik Page reports he has been well overall in the interim since her last visit.  He has been faithfully taking his Zytiga therapy without any difficulty.  He reports he is having a lot of emotional stress at home but is not elaborating on the details of the situation.  He reports that he feels like he is under a lot of stress and generally feels unwell, but no specific medical issues.  He reports that he has been praying a lot about the results of his PSA and is disheartened to hear that the levels are increasing.  He notes he would like to see today's results before making any final decisions.  We did cover possible treatment options including chemotherapy versus Pluvicto therapy.  He voices understanding and wanted to wait for the final PSA results before considering options moving forward.  Otherwise he denies any fevers, chills, sweats, nausea, vomiting or diarrhea..  Overall he feels well and is willing and able to proceed with  therapy at this time.  A full 10 point ROS was otherwise negative.   MEDICAL HISTORY:  Past Medical History:  Diagnosis Date   HTN (hypertension)    Myocardial infarction [facility])     SURGICAL HISTORY: Past Surgical History:  Procedure Laterality Date   CARDIAC CATHETERIZATION N/A 08/15/2015   Procedure: Left Heart Cath and Coronary Angiography;  Surgeon: Marykay Lex, MD;  Location: [facility] INVASIVE CV LAB;  Service: Cardiovascular;  Laterality: N/A;   CARDIAC CATHETERIZATION N/A 08/15/2015   Procedure: Left Heart Cath and Coronary Angiography;  Surgeon: Marykay Lex, MD;  Location: [facility] INVASIVE CV LAB;  Service: Cardiovascular;  Laterality: N/A;   CARDIAC CATHETERIZATION N/A 08/15/2015   Procedure: Coronary Stent Intervention;  Surgeon: Marykay Lex, MD;  Location: [facility] INVASIVE CV LAB;  Service: Cardiovascular;  Laterality: N/A;   IR CONVERT RIGHT NEPHROSTOMY TO NEPHROURETERAL CATH  11/12/2021   IR EXT NEPHROURETERAL CATH EXCHANGE  01/07/2022   IR NEPHROSTOMY EXCHANGE RIGHT  02/20/2022   IR NEPHROSTOMY PLACEMENT RIGHT  10/01/2021   TONSILLECTOMY      SOCIAL HISTORY: Social History   Socioeconomic History   Marital status: Married    Spouse name: Not on file   Number of children: Not on file   Years of education: Not on file   Highest education level: Not on file  Occupational History   Not on file  Tobacco Use   Smoking status: Never   Smokeless tobacco: Never  Vaping Use   Vaping status: Never Used  Substance and Sexual Activity  Alcohol use: No    Alcohol/week: 0.0 standard drinks of alcohol   Drug use: No   Sexual activity: Not on file  Other Topics Concern   Not on file  Social History Narrative   Works as a Pensions consultant; 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  MEDICATIONS:  Current Outpatient Medications  Medication Sig Dispense Refill   ondansetron (ZOFRAN) 8 MG tablet Take 1 tablet (8 mg total) by mouth every 8 (eight) hours as needed. 30 tablet 0   prochlorperazine (COMPAZINE) 10 MG tablet Take 1 tablet (10 mg total) by mouth every 6 (six) hours as needed for nausea or vomiting. 30 tablet 0   abiraterone acetate (ZYTIGA) 250 MG tablet Take 4 tablets (1,000 mg total) by mouth daily. Take on an empty stomach 1 hour before or 2 hours after a meal 120 tablet 2   aspirin EC 81 MG EC tablet Take 1 tablet (81 mg total) by mouth daily.     atorvastatin (LIPITOR) 80 MG tablet TAKE 1 TABLET BY MOUTH EVERY DAY 90 tablet 1   calcium-vitamin D (OSCAL WITH D) 500-5 MG-MCG tablet Take 1 tablet by mouth 2 (two) times daily. 90 tablet 3   feeding supplement (ENSURE ENLIVE / ENSURE PLUS) LIQD Take 237 mLs by mouth 2 (two) times daily between meals. 237 mL 12   ibuprofen (ADVIL) 800 MG tablet Take 1 tablet (800 mg total) by mouth 3 (three) times daily. 90 tablet 1   metoprolol tartrate (LOPRESSOR) 25 MG tablet Take 1 tablet (25 mg total) by mouth 2 (two) times daily. 14 tablet 0   nitroGLYCERIN (NITROSTAT) 0.4 MG SL tablet DISSOLVE 1 TAB UNDER TONGUE FOR CHEST PAIN - IF PAIN REMAINS AFTER 5 MIN, CALL 911 AND REPEAT DOSE. MAX 3 TABS IN 15 MINUTES (Patient taking differently: 0.4 mg every 5 (five) minutes as needed for chest pain.) 25 tablet 3   oxyCODONE (OXY IR/ROXICODONE) 5 MG immediate release tablet Take 2 tablets (10 mg total) by mouth every 4 (four) hours as needed for severe pain. 120 tablet 0   predniSONE (DELTASONE) 5 MG tablet Take 1 tablet (5 mg total) by mouth daily with breakfast. 90 tablet 1   senna-docusate (SENOKOT-S) 8.6-50 MG tablet Take 1 tablet by mouth at bedtime as  needed for mild constipation. 30 tablet 0   No current facility-administered medications for this visit.    REVIEW OF SYSTEMS:   Constitutional: ( - ) fevers, ( - )  chills , ( - ) night sweats Eyes: ( - ) blurriness of vision, ( - ) double vision, ( - ) watery eyes Ears, nose, mouth, throat, and face: ( - ) mucositis, ( - ) sore throat Respiratory: ( - ) cough, ( - ) dyspnea, ( - ) wheezes Cardiovascular: ( - ) palpitation, ( - ) chest discomfort, ( - ) lower extremity swelling Gastrointestinal:  ( - ) nausea, ( - ) heartburn, ( - ) change in bowel habits Skin: ( - ) abnormal skin rashes Lymphatics: ( - ) new  lymphadenopathy, ( - ) easy bruising Neurological: ( - ) numbness, ( - ) tingling, ( - ) new weaknesses Behavioral/Psych: ( - ) mood change, ( - ) new changes  All other systems were reviewed with the patient and are negative.  PHYSICAL EXAMINATION: ECOG PERFORMANCE STATUS: 0 - Asymptomatic  Vitals:   08/27/23 0904 08/27/23 0907  BP: (!) 141/80 136/85  Pulse: 79   Resp: 18   Temp: 97.9 F (36.6 C)   SpO2: 100%      Filed Weights   08/27/23 0904  Weight: 241 lb 8 oz (109.5 kg)      GENERAL: Well-appearing elderly African-American male, alert, no distress and comfortable SKIN: skin color, texture, turgor are normal, no rashes or significant lesions EYES: conjunctiva are pink and non-injected, sclera clear LUNGS: clear to auscultation and percussion with normal breathing effort HEART: regular rate & rhythm and no murmurs and no lower extremity edema Musculoskeletal: no cyanosis of digits and no clubbing  PSYCH: alert & oriented x 3, fluent speech NEURO: no focal motor/sensory deficits  LABORATORY DATA:  I have reviewed the data as listed    Latest Ref Rng & Units 08/27/2023    8:39 AM 08/06/2023    8:14 AM 06/11/2023   10:09 AM  CBC  WBC 4.0 - 10.5 K/uL 4.9  4.3  4.1   Hemoglobin 13.0 - 17.0 g/dL 60.4  54.0  98.1   Hematocrit 39.0 - 52.0 % 38.7  37.9  37.3    Platelets 150 - 400 K/uL 180  205  175        Latest Ref Rng & Units 08/27/2023    8:39 AM 08/06/2023    8:14 AM 06/11/2023   10:09 AM  CMP  Glucose 70 - 99 mg/dL 191  478  295   BUN 8 - 23 mg/dL 16  14  15    Creatinine 0.61 - 1.24 mg/dL 6.21  3.08  6.57   Sodium 135 - 145 mmol/L 142  143  141   Potassium 3.5 - 5.1 mmol/L 3.8  3.3  3.3   Chloride 98 - 111 mmol/L 101  102  104   CO2 22 - 32 mmol/L 34  34  32   Calcium 8.9 - 10.3 mg/dL 9.1  9.3  8.9   Total Protein 6.5 - 8.1 g/dL 6.4  6.5  6.2   Total Bilirubin <1.2 mg/dL 1.5  1.7  1.8   Alkaline Phos 38 - 126 U/L 132  130  84   AST 15 - 41 U/L 13  11  13    ALT 0 - 44 U/L 16  12  13      RADIOGRAPHIC STUDIES: NM PET (PSMA) SKULL TO MID THIGH Result Date: 08/26/2023 CLINICAL DATA:  Prostate carcinoma with biochemical recurrence. EXAM: NUCLEAR MEDICINE PET SKULL BASE TO THIGH TECHNIQUE: 8.2 mCi Flotufolastat (Posluma) was injected intravenously. Full-ring PET imaging was performed from the skull base to thigh after the radiotracer. CT data was obtained and used for attenuation correction and anatomic localization. COMPARISON:  PSV PET scan 08/28/2022 FINDINGS: NECK No radiotracer activity in neck lymph nodes. Incidental CT finding: None. CHEST No radiotracer accumulation within mediastinal or hilar lymph nodes. No suspicious pulmonary nodules on the CT scan. Incidental CT finding: None. ABDOMEN/PELVIS Prostate: Again demonstrated focal activity in the anterior RIGHT lobe of the prostate gland. Potential residual prostate carcinoma. Similar to prior findings. Lymph nodes: Previous described radiotracer avid RIGHT internal iliac node has resolved in the interval.  No radiotracer avid pelvic or abdominal lymph present Liver: No evidence of liver metastasis. Incidental CT finding: None. SKELETON There is clear progression of skeletal metastasis. Lesions are more confluent than comparison exam. Activity is increased. For example abnormal intense  radiotracer activity surrounding the entirety of the LEFT acetabulum with SUV max equal 46.9. Previously mottled radiotracer activity with SUV max equal 7.1. Lesion the posterior RIGHT acetabulum SUV max equal 35 is new from. There is underlying diffuse bone sclerosis without clear change from comparison exam. Multiple lesions in the midthoracic spine increase in radiotracer activity. For example lesions at T5-T7 with SUV max equal 65.7 increased from SUV max equal 10.1. Numerous new intensely radiotracer avid lesions in the sternum. IMPRESSION: 1. Progression skeletal metastasis with multiple new and/or progressive intensely radiotracer avid lesions throughout the pelvis, spine, and sternum. No clear CT new changes with diffuse underlying sclerotic change on the CT portion exam 2. No evidence of visceral metastasis or metastatic lymphadenopathy Electronically Signed   By: Genevive Bi M.D.   On: 08/26/2023 16:09    ASSESSMENT & PLAN Saahir Halberstadt 72 y.o. male with medical history significant for [facility] presents for a follow up visit.   # Metastatic Castrate Sensitive Prostate Cancer  --continue lupron 30mg  subq q 4 months indefinitely  --transition from Zytiga to enzalutamide 160mg  PO daily due to cost. Was able to get approval to return to Zytiga due to faillure of enzalutamide.  -- Labs today show white blood cell count 4.9, Hgb 12.9, MCV 95.1, Plt 180 -- Last PSA 50.3 on 06/11/2023.  --If PSA continues to rise on Zytiga therapy would need to consider alternative treatments.  Would recommend consideration of Pluvicto versus chemotherapy --Will administer Xgeva and Lupron every 4 months. -- Return to clinic pending the results of his PSA, suspect he will need to start chemotherapy.  We began this discussion.  Discussed docetaxel versus Perfecto, would prefer docetaxel at this time.  No orders of the defined types were placed in this encounter.   All questions were answered. The patient knows to  call the clinic with any problems, questions or concerns.  A total of more than 30 minutes were spent on this encounter with face-to-face time and non-face-to-face time, including preparing to see the patient, ordering tests and/or medications, counseling the patient and coordination of care as outlined above.   Erik Barns, MD Department of Hematology/Oncology [facility] Phone: [phone] Pager: [phone] Email: [email or web address]  09/13/2023 2:12 PM

## 2023-08-28 LAB — PROSTATE-SPECIFIC AG, SERUM (LABCORP): Prostate Specific Ag, Serum: 134 ng/mL — ABNORMAL HIGH (ref 0.0–4.0)

## 2023-08-28 LAB — TESTOSTERONE: Testosterone: <3 ng/dL — ABNORMAL LOW (ref 264–916)

## 2023-09-01 ENCOUNTER — Telehealth: Payer: Self-pay | Admitting: *Deleted

## 2023-09-01 NOTE — Telephone Encounter (Signed)
Received call from pt inquiring about about his PSA result. Shared with pt that his PSA has gone up again. It is at 134, up from 102,  3 weeks ago. Advised that I would let Dr. Leonides Schanz know he called and have him call back when he is in the office on Wednesday.  Pt is agreeable to this.

## 2023-09-04 ENCOUNTER — Other Ambulatory Visit (HOSPITAL_COMMUNITY): Payer: Self-pay

## 2023-09-04 ENCOUNTER — Other Ambulatory Visit (HOSPITAL_COMMUNITY): Payer: Self-pay | Admitting: Pharmacy Technician

## 2023-09-04 NOTE — Progress Notes (Signed)
Specialty Pharmacy Refill Coordination Note  Erik Page is a 72 y.o. male contacted today regarding refills of specialty medication(s) Abiraterone Acetate (ZYTIGA)   Patient requested Delivery   Delivery date: 09/15/23   Verified address: [address] [place]   Medication will be filled on 09/12/23.

## 2023-09-12 ENCOUNTER — Other Ambulatory Visit: Payer: Self-pay

## 2023-09-13 ENCOUNTER — Encounter: Payer: Self-pay | Admitting: Hematology and Oncology

## 2023-09-18 ENCOUNTER — Inpatient Hospital Stay: Payer: Medicare Other | Admitting: Hematology and Oncology

## 2023-09-18 ENCOUNTER — Other Ambulatory Visit: Payer: Self-pay | Admitting: Hematology and Oncology

## 2023-09-18 VITALS — BP 155/88 | HR 73 | Temp 98.2°F | Resp 16

## 2023-09-18 DIAGNOSIS — C61 Malignant neoplasm of prostate: Secondary | ICD-10-CM

## 2023-09-18 DIAGNOSIS — D649 Anemia, unspecified: Secondary | ICD-10-CM | POA: Diagnosis not present

## 2023-09-18 DIAGNOSIS — C7951 Secondary malignant neoplasm of bone: Secondary | ICD-10-CM | POA: Diagnosis not present

## 2023-09-18 NOTE — Progress Notes (Signed)
[facility] [phone]   Fax:([phone]  PROGRESS NOTE  Patient Care Team: Lorelei Pont, DO as PCP - General (Family Medicine)  Hematological/Oncological History # Metastatic Castrate Sensitive Prostate Cancer  09/30/2021: percutaneous nephrostomy tube placement and retroperitoneal lymph node biopsy  10/01/2021: Deborra Medina 240 mg  09/2021: radiation therapy to the lumbar spine after receiving 30 Gray in 10 fractions  11/01/2021: started Eligard 30 mg every 4 months and Zytiga 1000 mg daily with prednisone 5 mg daily  09/25/2022: last visit with Dr. Clelia Croft 10/29/2022: transition care to Dr. Leonides Schanz. Insurance no longer assisting with Zytiga 1000 mg daily, converting enzalutamide 160 mg PO daily.    Interval History:  Erik [age] y.o. Page with medical history significant for [facility] presents for a follow up visit. The patient's last visit was on 08/27/2023. In the interim since the last visit he has continued on zytiga therapy.   On exam today Erik Page is accompanied by his wife today and is rather reserved.  He reports that he has continued to think about chemotherapy but would like more time to consider it.  He reports that he has not had any new symptoms in the interim since her last visit.  Today we discussed the treatment options moving forward including chemotherapy, Pluvicto, and comfort based care.  We also discussed what to expect with side effects of all above treatments.  We discussed port placement as an option.  We covered all questions and concerns that the patient and his wife had.  The patient notes that he will call us next week to discuss further.  Otherwise he denies any fevers, chills, sweats, nausea, vomiting or diarrhea..  Overall he feels well and is willing and able to proceed with therapy at this time.  A full 10 point ROS was otherwise negative.   MEDICAL HISTORY:  Past Medical History:  Diagnosis Date   HTN (hypertension)    Myocardial  infarction [facility])     SURGICAL HISTORY: Past Surgical History:  Procedure Laterality Date   CARDIAC CATHETERIZATION N/A 08/15/2015   Procedure: Left Heart Cath and Coronary Angiography;  Surgeon: Marykay Lex, MD;  Location: [facility] INVASIVE CV LAB;  Service: Cardiovascular;  Laterality: N/A;   CARDIAC CATHETERIZATION N/A 08/15/2015   Procedure: Left Heart Cath and Coronary Angiography;  Surgeon: Marykay Lex, MD;  Location: [facility] INVASIVE CV LAB;  Service: Cardiovascular;  Laterality: N/A;   CARDIAC CATHETERIZATION N/A 08/15/2015   Procedure: Coronary Stent Intervention;  Surgeon: Marykay Lex, MD;  Location: [facility] INVASIVE CV LAB;  Service: Cardiovascular;  Laterality: N/A;   IR CONVERT RIGHT NEPHROSTOMY TO NEPHROURETERAL CATH  11/12/2021   IR EXT NEPHROURETERAL CATH EXCHANGE  01/07/2022   IR NEPHROSTOMY EXCHANGE RIGHT  02/20/2022   IR NEPHROSTOMY PLACEMENT RIGHT  10/01/2021   TONSILLECTOMY      SOCIAL HISTORY: Social History   Socioeconomic History   Marital status: Married    Spouse name: Not on file   Number of children: Not on file   Years of education: Not on file   Highest education level: Not on file  Occupational History   Not on file  Tobacco Use   Smoking status: Never   Smokeless tobacco: Never  Vaping Use   Vaping status: Never Used  Substance and Sexual Activity   Alcohol use: No    Alcohol/week: 0.0 standard drinks of alcohol   Drug use: No   Sexual activity: Not on file  Other Topics Concern  Not on file  Social History Narrative   Works as a Pensions consultant; 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  MEDICATIONS:  Current Outpatient Medications  Medication Sig Dispense Refill   abiraterone acetate (ZYTIGA) 250 MG tablet Take 4 tablets (1,000 mg total) by mouth daily. Take on an empty stomach 1 hour before or 2 hours after a meal 120 tablet 2   aspirin EC 81 MG EC tablet Take 1 tablet (81 mg total) by mouth daily.     atorvastatin (LIPITOR) 80 MG tablet TAKE 1 TABLET BY MOUTH EVERY DAY 90 tablet 1   calcium-vitamin D (OSCAL WITH D) 500-5 MG-MCG tablet Take 1 tablet by mouth 2 (two) times daily. 90 tablet 3   feeding supplement (ENSURE ENLIVE / ENSURE PLUS) LIQD Take 237 mLs by mouth 2 (two) times daily between meals. 237 mL 12   ibuprofen (ADVIL) 800 MG tablet Take 1 tablet (800 mg total) by mouth 3 (three) times daily. 90 tablet 1   metoprolol tartrate (LOPRESSOR) 25 MG tablet Take 1 tablet (25 mg total) by mouth 2 (two) times daily. 14 tablet 0   nitroGLYCERIN (NITROSTAT) 0.4 MG SL tablet DISSOLVE 1 TAB UNDER TONGUE FOR CHEST PAIN - IF PAIN REMAINS AFTER 5 MIN, CALL 911 AND REPEAT DOSE. MAX 3 TABS IN 15 MINUTES (Patient taking differently: 0.4 mg every 5 (five) minutes as needed for chest pain.) 25 tablet 3   ondansetron (ZOFRAN) 8 MG tablet Take 1 tablet (8 mg total) by mouth every 8 (eight) hours as needed. 30 tablet 0   oxyCODONE (OXY IR/ROXICODONE) 5 MG immediate release tablet Take 2 tablets (10 mg total) by mouth every 4 (four) hours as needed for severe pain. 120 tablet 0   predniSONE (DELTASONE) 5 MG tablet Take 1 tablet (5 mg total) by mouth daily with breakfast. 90 tablet 1   prochlorperazine (COMPAZINE) 10 MG tablet Take 1 tablet (10 mg total) by mouth every 6 (six) hours as needed for nausea or vomiting. 30 tablet 0   senna-docusate (SENOKOT-S) 8.6-50 MG tablet Take 1 tablet by mouth at bedtime as needed for mild constipation. 30 tablet 0   No current facility-administered medications for this visit.    REVIEW OF SYSTEMS:   Constitutional: ( - ) fevers, ( - )   chills , ( - ) night sweats Eyes: ( - ) blurriness of vision, ( - ) double vision, ( - ) watery eyes Ears, nose, mouth, throat, and face: ( - ) mucositis, ( - ) sore throat Respiratory: ( - ) cough, ( - ) dyspnea, ( - ) wheezes Cardiovascular: ( - ) palpitation, ( - ) chest discomfort, ( - ) lower extremity swelling Gastrointestinal:  ( - ) nausea, ( - ) heartburn, ( - ) change in bowel habits Skin: ( - ) abnormal skin rashes Lymphatics: ( - ) new lymphadenopathy, ( - ) easy bruising Neurological: ( - ) numbness, ( - ) tingling, ( - ) new weaknesses Behavioral/Psych: ( - ) mood change, ( - ) new  changes  All other systems were reviewed with the patient and are negative.  PHYSICAL EXAMINATION: ECOG PERFORMANCE STATUS: 0 - Asymptomatic  Vitals:   09/18/23 1119  BP: (!) 155/88  Pulse: 73  Resp: 16  Temp: 98.2 F (36.8 C)  SpO2: 97%      There were no vitals filed for this visit.     GENERAL: Well-appearing elderly African-American Page, alert, no distress and comfortable SKIN: skin color, texture, turgor are normal, no rashes or significant lesions EYES: conjunctiva are pink and non-injected, sclera clear LUNGS: clear to auscultation and percussion with normal breathing effort HEART: regular rate & rhythm and no murmurs and no lower extremity edema Musculoskeletal: no cyanosis of digits and no clubbing  PSYCH: alert & oriented x 3, fluent speech NEURO: no focal motor/sensory deficits  LABORATORY DATA:  I have reviewed the data as listed    Latest Ref Rng & Units 08/27/2023    8:39 AM 08/06/2023    8:14 AM 06/11/2023   10:09 AM  CBC  WBC 4.0 - 10.5 K/uL 4.9  4.3  4.1   Hemoglobin 13.0 - 17.0 g/dL 16.1  09.6  04.5   Hematocrit 39.0 - 52.0 % 38.7  37.9  37.3   Platelets 150 - 400 K/uL 180  205  175        Latest Ref Rng & Units 08/27/2023    8:39 AM 08/06/2023    8:14 AM 06/11/2023   10:09 AM  CMP  Glucose 70 - 99 mg/dL 409  811  914   BUN 8 - 23 mg/dL 16  14  15     Creatinine 0.61 - 1.24 mg/dL 7.82  9.56  2.13   Sodium 135 - 145 mmol/L 142  143  141   Potassium 3.5 - 5.1 mmol/L 3.8  3.3  3.3   Chloride 98 - 111 mmol/L 101  102  104   CO2 22 - 32 mmol/L 34  34  32   Calcium 8.9 - 10.3 mg/dL 9.1  9.3  8.9   Total Protein 6.5 - 8.1 g/dL 6.4  6.5  6.2   Total Bilirubin <1.2 mg/dL 1.5  1.7  1.8   Alkaline Phos 38 - 126 U/L 132  130  84   AST 15 - 41 U/L 13  11  13    ALT 0 - 44 U/L 16  12  13      RADIOGRAPHIC STUDIES: NM PET (PSMA) SKULL TO MID THIGH Result Date: 08/26/2023 CLINICAL DATA:  Prostate carcinoma with biochemical recurrence. EXAM: NUCLEAR MEDICINE PET SKULL BASE TO THIGH TECHNIQUE: 8.2 mCi Flotufolastat (Posluma) was injected intravenously. Full-ring PET imaging was performed from the skull base to thigh after the radiotracer. CT data was obtained and used for attenuation correction and anatomic localization. COMPARISON:  PSV PET scan 08/28/2022 FINDINGS: NECK No radiotracer activity in neck lymph nodes. Incidental CT finding: None. CHEST No radiotracer accumulation within mediastinal or hilar lymph nodes. No suspicious pulmonary nodules on the CT scan. Incidental CT finding: None. ABDOMEN/PELVIS Prostate: Again demonstrated focal activity in the anterior RIGHT lobe of the prostate gland. Potential residual prostate carcinoma. Similar to prior findings. Lymph nodes: Previous described radiotracer avid RIGHT internal iliac node has resolved in the interval. No radiotracer avid pelvic or abdominal lymph present Liver: No evidence of liver metastasis. Incidental CT finding: None. SKELETON There is clear progression of skeletal metastasis. Lesions are more confluent than comparison exam. Activity is increased. For example abnormal intense radiotracer activity surrounding  the entirety of the LEFT acetabulum with SUV max equal 46.9. Previously mottled radiotracer activity with SUV max equal 7.1. Lesion the posterior RIGHT acetabulum SUV max equal 35 is new  from. There is underlying diffuse bone sclerosis without clear change from comparison exam. Multiple lesions in the midthoracic spine increase in radiotracer activity. For example lesions at T5-T7 with SUV max equal 65.7 increased from SUV max equal 10.1. Numerous new intensely radiotracer avid lesions in the sternum. IMPRESSION: 1. Progression skeletal metastasis with multiple new and/or progressive intensely radiotracer avid lesions throughout the pelvis, spine, and sternum. No clear CT new changes with diffuse underlying sclerotic change on the CT portion exam 2. No evidence of visceral metastasis or metastatic lymphadenopathy Electronically Signed   By: Genevive Bi M.D.   On: 08/26/2023 16:09    ASSESSMENT & PLAN Erik Page 72 y.o. Page with medical history significant for [facility] presents for a follow up visit.   # Metastatic Castrate Sensitive Prostate Cancer  --continue lupron 30mg  subq q 4 months indefinitely  --transition from Zytiga to enzalutamide 160mg  PO daily due to cost. Was able to get approval to return to Zytiga due to faillure of enzalutamide.  -- Labs today show white blood cell count 4.9, Hgb 12.9, MCV 95.1, Plt 180 -- Last PSA 134.0 on 08/27/2023.  --If PSA continues to rise on Zytiga therapy would need to consider alternative treatments.  Would recommend consideration of Pluvicto versus chemotherapy --Will administer Xgeva and Lupron every 4 months. -- Return to clinic pending the results of his PSA, suspect he will need to start chemotherapy.  We began this discussion.  Discussed docetaxel versus Pluvicto, would prefer docetaxel at this time.  Patient notes he will call us next week to discuss further about his decisions regarding the start of chemotherapy.  No orders of the defined types were placed in this encounter.   All questions were answered. The patient knows to call the clinic with any problems, questions or concerns.  A total of more than 30 minutes were spent  on this encounter with face-to-face time and non-face-to-face time, including preparing to see the patient, ordering tests and/or medications, counseling the patient and coordination of care as outlined above.   Erik Barns, MD Department of Hematology/Oncology [facility] at [facility] Phone: [phone] Pager: [phone] Email: [email or web address]  09/18/2023 3:02 PM

## 2023-10-08 ENCOUNTER — Inpatient Hospital Stay (HOSPITAL_BASED_OUTPATIENT_CLINIC_OR_DEPARTMENT_OTHER): Payer: Medicare Other | Admitting: Hematology and Oncology

## 2023-10-08 ENCOUNTER — Other Ambulatory Visit: Payer: Self-pay

## 2023-10-08 ENCOUNTER — Inpatient Hospital Stay: Payer: Medicare Other

## 2023-10-08 ENCOUNTER — Inpatient Hospital Stay: Payer: Medicare Other | Attending: Hematology and Oncology

## 2023-10-08 VITALS — BP 126/75 | HR 75 | Temp 98.5°F | Resp 16 | Ht 74.0 in | Wt 240.4 lb

## 2023-10-08 DIAGNOSIS — C61 Malignant neoplasm of prostate: Secondary | ICD-10-CM | POA: Diagnosis not present

## 2023-10-08 DIAGNOSIS — C7951 Secondary malignant neoplasm of bone: Secondary | ICD-10-CM | POA: Diagnosis present

## 2023-10-08 LAB — CBC WITH DIFFERENTIAL (CANCER CENTER ONLY)
Abs Immature Granulocytes: 0.02 10*3/uL (ref 0.00–0.07)
Basophils Absolute: 0 10*3/uL (ref 0.0–0.1)
Basophils Relative: 0 %
Eosinophils Absolute: 0.1 10*3/uL (ref 0.0–0.5)
Eosinophils Relative: 2 %
HCT: 35.7 % — ABNORMAL LOW (ref 39.0–52.0)
Hemoglobin: 12.2 g/dL — ABNORMAL LOW (ref 13.0–17.0)
Immature Granulocytes: 0 %
Lymphocytes Relative: 20 %
Lymphs Abs: 1.1 10*3/uL (ref 0.7–4.0)
MCH: 32 pg (ref 26.0–34.0)
MCHC: 34.2 g/dL (ref 30.0–36.0)
MCV: 93.7 fL (ref 80.0–100.0)
Monocytes Absolute: 0.3 10*3/uL (ref 0.1–1.0)
Monocytes Relative: 6 %
Neutro Abs: 3.8 10*3/uL (ref 1.7–7.7)
Neutrophils Relative %: 72 %
Platelet Count: 198 10*3/uL (ref 150–400)
RBC: 3.81 MIL/uL — ABNORMAL LOW (ref 4.22–5.81)
RDW: 12.6 % (ref 11.5–15.5)
WBC Count: 5.3 10*3/uL (ref 4.0–10.5)
nRBC: 0 % (ref 0.0–0.2)

## 2023-10-08 LAB — CMP (CANCER CENTER ONLY)
ALT: 21 U/L (ref 0–44)
AST: 13 U/L — ABNORMAL LOW (ref 15–41)
Albumin: 3.7 g/dL (ref 3.5–5.0)
Alkaline Phosphatase: 174 U/L — ABNORMAL HIGH (ref 38–126)
Anion gap: 6 (ref 5–15)
BUN: 16 mg/dL (ref 8–23)
CO2: 31 mmol/L (ref 22–32)
Calcium: 8.8 mg/dL — ABNORMAL LOW (ref 8.9–10.3)
Chloride: 102 mmol/L (ref 98–111)
Creatinine: 0.86 mg/dL (ref 0.61–1.24)
GFR, Estimated: >60 mL/min (ref >60)
Glucose, Bld: 194 mg/dL — ABNORMAL HIGH (ref 70–99)
Potassium: 3.7 mmol/L (ref 3.5–5.1)
Sodium: 139 mmol/L (ref 135–145)
Total Bilirubin: 1.3 mg/dL — ABNORMAL HIGH (ref 0.0–1.2)
Total Protein: 6.4 g/dL — ABNORMAL LOW (ref 6.5–8.1)

## 2023-10-08 MED ORDER — DENOSUMAB 120 MG/1.7ML ~~LOC~~ SOLN
120.0000 mg | Freq: Once | SUBCUTANEOUS | Status: AC
Start: 2023-10-08 — End: 2023-10-08
  Administered 2023-10-08: 120 mg via SUBCUTANEOUS

## 2023-10-08 MED ORDER — METOPROLOL TARTRATE 25 MG PO TABS: 25.0000 mg | ORAL_TABLET | Freq: Two times a day (BID) | ORAL | 1 refills | Status: DC

## 2023-10-08 NOTE — Progress Notes (Signed)
 [facility] [phone]   Fax:(336) [phone]  PROGRESS NOTE  Patient Care Team: Dorrine Gaudy, DO as PCP - General (Family Medicine)  Hematological/Oncological History # Metastatic Castrate Sensitive Prostate Cancer  09/30/2021: percutaneous nephrostomy tube placement and retroperitoneal lymph node biopsy  10/01/2021: Firmagon  240 mg  09/2021: radiation therapy to the lumbar spine after receiving 30 Gray in 10 fractions  11/01/2021: started Eligard  30 mg every 4 months and Zytiga  1000 mg daily with prednisone  5 mg daily  09/25/2022: last visit with Dr. Dirk Fredericks 10/29/2022: transition care to Dr. Rosaline Coma. Insurance no longer assisting with Zytiga  1000 mg daily, converting enzalutamide  160 mg PO daily.    Interval History:  Erik Page [age] y.o. Page with medical history significant for [facility] presents for a follow up visit. The patient's last visit was on 09/18/2023. In the interim since the last visit we had a telephone visit, but he continues to want to delay start of chemotherapy.    On exam today Erik Page is having some increasing back pain.  He is been taking ibuprofen  about 800 mg/day for this.  He reports only takes about 1/day.  He notes that he is quite anxious to hear what his PSA levels are doing.  He reports he is trying to change his mindset and decrease his stress levels.  He reports that he is willing and able to proceed with chemotherapy treatment in the event his PSA has continued to rise..  Otherwise he denies any fevers, chills, sweats, nausea, vomiting or diarrhea..  Overall he feels well and is willing and able to proceed with therapy at this time.  A full 10 point ROS was otherwise negative.  The bulk of our discussion focused on the details regarding chemotherapy treatment for his prostate cancer.  He voices understanding and notes that he is willing and able to proceed with chemotherapy.  We discussed the risks and benefits as well as expected side  effects.  MEDICAL HISTORY:  Past Medical History:  Diagnosis Date   HTN (hypertension)    Myocardial infarction [facility])     SURGICAL HISTORY: Past Surgical History:  Procedure Laterality Date   CARDIAC CATHETERIZATION N/A 08/15/2015   Procedure: Left Heart Cath and Coronary Angiography;  Surgeon: Arleen Lacer, MD;  Location: [facility] INVASIVE CV LAB;  Service: Cardiovascular;  Laterality: N/A;   CARDIAC CATHETERIZATION N/A 08/15/2015   Procedure: Left Heart Cath and Coronary Angiography;  Surgeon: Arleen Lacer, MD;  Location: [facility] INVASIVE CV LAB;  Service: Cardiovascular;  Laterality: N/A;   CARDIAC CATHETERIZATION N/A 08/15/2015   Procedure: Coronary Stent Intervention;  Surgeon: Arleen Lacer, MD;  Location: [facility] INVASIVE CV LAB;  Service: Cardiovascular;  Laterality: N/A;   IR CONVERT RIGHT NEPHROSTOMY TO NEPHROURETERAL CATH  11/12/2021   IR EXT NEPHROURETERAL CATH EXCHANGE  01/07/2022   IR NEPHROSTOMY EXCHANGE RIGHT  02/20/2022   IR NEPHROSTOMY PLACEMENT RIGHT  10/01/2021   TONSILLECTOMY      SOCIAL HISTORY: Social History   Socioeconomic History   Marital status: Married    Spouse name: Not on file   Number of children: Not on file   Years of education: Not on file   Highest education level: Not on file  Occupational History   Not on file  Tobacco Use   Smoking status: Never   Smokeless tobacco: Never  Vaping Use   Vaping status: Never Used  Substance and Sexual Activity   Alcohol use: No    Alcohol/week: 0.0 standard  drinks of alcohol   Drug use: No   Sexual activity: Not on file  Other Topics Concern   Not on file  Social History Narrative   Works as a Pensions consultant; 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  MEDICATIONS:  Current Outpatient Medications  Medication Sig Dispense Refill   abiraterone  acetate (ZYTIGA ) 250 MG tablet Take 4 tablets (1,000 mg total) by mouth daily. Take on an empty stomach 1 hour before or 2 hours after a meal 120 tablet 2   aspirin  EC 81 MG EC tablet Take 1 tablet (81 mg total) by mouth daily.     atorvastatin  (LIPITOR ) 80 MG tablet TAKE 1 TABLET BY MOUTH EVERY DAY 90 tablet 1   calcium -vitamin D (OSCAL WITH D) 500-5 MG-MCG tablet Take 1 tablet by mouth 2 (two) times daily. 90 tablet 3   feeding supplement (ENSURE ENLIVE / ENSURE PLUS) LIQD Take 237 mLs by mouth 2 (two) times daily between meals. 237 mL 12   ibuprofen  (ADVIL ) 800 MG tablet Take 1 tablet (800 mg total) by mouth 3 (three) times daily. 90 tablet 1   metoprolol  tartrate (LOPRESSOR ) 25 MG tablet Take 1 tablet (25 mg total) by mouth 2 (two) times daily. 180 tablet 1   nitroGLYCERIN  (NITROSTAT ) 0.4 MG SL tablet DISSOLVE 1 TAB UNDER TONGUE FOR CHEST PAIN - IF PAIN REMAINS AFTER 5 MIN, CALL 911 AND REPEAT DOSE. MAX 3 TABS IN 15 MINUTES (Patient taking differently: 0.4 mg every 5 (five) minutes as needed for chest pain.) 25 tablet 3   ondansetron  (ZOFRAN ) 8 MG tablet Take 1 tablet (8 mg total) by mouth every 8 (eight) hours as needed. 30 tablet 0   oxyCODONE  (OXY IR/ROXICODONE ) 5 MG immediate release tablet Take 2 tablets (10 mg total) by mouth every 4 (four) hours as needed for severe pain. 120 tablet 0   predniSONE  (DELTASONE ) 5 MG tablet Take 1 tablet (5 mg total) by mouth daily with breakfast. 90 tablet 1   prochlorperazine  (COMPAZINE ) 10 MG tablet Take 1 tablet (10 mg total) by mouth every 6 (six) hours as needed for nausea or vomiting. 30 tablet 0   senna-docusate (SENOKOT-S) 8.6-50 MG tablet Take 1 tablet by mouth at bedtime as needed for mild constipation. 30 tablet 0   No  current facility-administered medications for this visit.    REVIEW OF SYSTEMS:   Constitutional: ( - ) fevers, ( - )  chills , ( - ) night sweats Eyes: ( - ) blurriness of vision, ( - ) double vision, ( - ) watery eyes Ears, nose, mouth, throat, and face: ( - ) mucositis, ( - ) sore throat Respiratory: ( - ) cough, ( - ) dyspnea, ( - ) wheezes Cardiovascular: ( - ) palpitation, ( - ) chest discomfort, ( - ) lower extremity swelling Gastrointestinal:  ( - ) nausea, ( - ) heartburn, ( - ) change in bowel habits Skin: ( - ) abnormal skin rashes Lymphatics: ( - ) new lymphadenopathy, ( - ) easy bruising Neurological: ( - )  numbness, ( - ) tingling, ( - ) new weaknesses Behavioral/Psych: ( - ) mood change, ( - ) new changes  All other systems were reviewed with the patient and are negative.  PHYSICAL EXAMINATION: ECOG PERFORMANCE STATUS: 0 - Asymptomatic  Vitals:   10/08/23 0856  BP: 126/75  Pulse: 75  Resp: 16  Temp: 98.5 F (36.9 C)  SpO2: 100%       Filed Weights   10/08/23 0856  Weight: 240 lb 6.4 oz (109 kg)       GENERAL: Well-appearing elderly African-American Page, alert, no distress and comfortable SKIN: skin color, texture, turgor are normal, no rashes or significant lesions EYES: conjunctiva are pink and non-injected, sclera clear LUNGS: clear to auscultation and percussion with normal breathing effort HEART: regular rate & rhythm and no murmurs and no lower extremity edema Musculoskeletal: no cyanosis of digits and no clubbing  PSYCH: alert & oriented x 3, fluent speech NEURO: no focal motor/sensory deficits  LABORATORY DATA:  I have reviewed the data as listed    Latest Ref Rng & Units 10/08/2023    8:41 AM 08/27/2023    8:39 AM 08/06/2023    8:14 AM  CBC  WBC 4.0 - 10.5 K/uL 5.3  4.9  4.3   Hemoglobin 13.0 - 17.0 g/dL 01.0  27.2  53.6   Hematocrit 39.0 - 52.0 % 35.7  38.7  37.9   Platelets 150 - 400 K/uL 198  180  205        Latest Ref Rng &  Units 10/08/2023    8:41 AM 08/27/2023    8:39 AM 08/06/2023    8:14 AM  CMP  Glucose 70 - 99 mg/dL 644  034  742   BUN 8 - 23 mg/dL 16  16  14    Creatinine 0.61 - 1.24 mg/dL 5.95  6.38  7.56   Sodium 135 - 145 mmol/L 139  142  143   Potassium 3.5 - 5.1 mmol/L 3.7  3.8  3.3   Chloride 98 - 111 mmol/L 102  101  102   CO2 22 - 32 mmol/L 31  34  34   Calcium  8.9 - 10.3 mg/dL 8.8  9.1  9.3   Total Protein 6.5 - 8.1 g/dL 6.4  6.4  6.5   Total Bilirubin 0.0 - 1.2 mg/dL 1.3  1.5  1.7   Alkaline Phos 38 - 126 U/L 174  132  130   AST 15 - 41 U/L 13  13  11    ALT 0 - 44 U/L 21  16  12      RADIOGRAPHIC STUDIES: No results found.   ASSESSMENT & PLAN Erik Page [age] y.o. Page with medical history significant for [facility] presents for a follow up visit.   # Metastatic Castrate Sensitive Prostate Cancer  --continue lupron  30mg  subq q 4 months indefinitely  --transition from Zytiga  to enzalutamide  160mg  PO daily due to cost. Was able to get approval to return to Zytiga  due to faillure of enzalutamide .  -- Labs today show white blood cell count 5.3, hemoglobin 12.2, MCV 93.7, platelets 198 -- Last PSA 134.0 on 08/27/2023.  --If PSA continues to rise on Zytiga  therapy would need to consider alternative treatments.  Would recommend consideration of Pluvicto versus chemotherapy --Will administer Xgeva  q 2 months and Lupron  every 4 months. -- Return to clinic pending the results of his PSA.  He is agreeable to starting chemotherapy if the PSA is increasing again on today's labs.  No orders of the defined types were placed in this encounter.   All questions were answered. The patient knows to call the clinic with any problems, questions or concerns.  A total of more than 30 minutes were spent on this encounter with face-to-face time and non-face-to-face time, including preparing to see the patient, ordering tests and/or medications, counseling the patient and coordination of care as outlined above.    Erik Clay, MD Department of Hematology/Oncology [facility] at [facility] Phone: [phone] Pager: [phone] Email: [email or web address]  10/08/2023 1:25 PM

## 2023-10-09 LAB — PROSTATE-SPECIFIC AG, SERUM (LABCORP): Prostate Specific Ag, Serum: 244 ng/mL — ABNORMAL HIGH (ref 0.0–4.0)

## 2023-10-10 ENCOUNTER — Other Ambulatory Visit: Payer: Self-pay | Admitting: Hematology and Oncology

## 2023-10-10 ENCOUNTER — Other Ambulatory Visit (HOSPITAL_COMMUNITY): Payer: Self-pay

## 2023-10-10 ENCOUNTER — Other Ambulatory Visit: Payer: Self-pay

## 2023-10-10 ENCOUNTER — Other Ambulatory Visit: Payer: Self-pay | Admitting: Pharmacy Technician

## 2023-10-10 ENCOUNTER — Telehealth: Payer: Self-pay | Admitting: *Deleted

## 2023-10-10 DIAGNOSIS — C61 Malignant neoplasm of prostate: Secondary | ICD-10-CM

## 2023-10-10 LAB — TESTOSTERONE: Testosterone: <3 ng/dL — ABNORMAL LOW (ref 264–916)

## 2023-10-10 NOTE — Telephone Encounter (Signed)
-----   Message from Erik Page sent at 10/10/2023 11:14 AM EST ----- Please let Erik Page know that his PSA rose to 244. We will order chemotherapy to start as soon as is feasible ----- Message ----- From: Interface, Lab In [place] Sent: 10/08/2023   8:55 AM EST To: Jaci Standard, MD

## 2023-10-10 NOTE — Progress Notes (Signed)
Specialty Pharmacy Refill Coordination Note  Erik Page is a 73 y.o. male contacted today regarding refills of specialty medication(s) Abiraterone Acetate (ZYTIGA)   Patient requested Delivery   Delivery date: 10/15/23   Verified address: Patient address [address]  [place]   Medication will be filled on 10/14/23.  Refill Request sent to MD; call if any delays

## 2023-10-10 NOTE — Telephone Encounter (Signed)
TCT patient regarding recent lab results. Spoke with pt. Advised that his PSA is continuing to increase. Pt acknowledged this and said he thought it was time for chemo.  He is reluctant but agreeable. Advised that we will be in touch with his patient education appts and his chemo appts with in the next week or so. Pt voiced understanding.

## 2023-10-13 ENCOUNTER — Other Ambulatory Visit: Payer: Self-pay

## 2023-10-14 ENCOUNTER — Other Ambulatory Visit (HOSPITAL_COMMUNITY): Payer: Self-pay

## 2023-10-15 ENCOUNTER — Other Ambulatory Visit: Payer: Self-pay | Admitting: Hematology and Oncology

## 2023-10-15 DIAGNOSIS — C61 Malignant neoplasm of prostate: Secondary | ICD-10-CM

## 2023-10-15 NOTE — Progress Notes (Signed)
Per Noralyn Pick. discontinuing & Disenrolled & refill Cancelled

## 2023-10-15 NOTE — Progress Notes (Signed)
START ON PATHWAY REGIMEN - Prostate ? ? ?  A cycle is every 21 days: ?    Prednisone  ?    Docetaxel  ? ?**Always confirm dose/schedule in your pharmacy ordering system** ? ?Patient Characteristics: ?Adenocarcinoma, Recurrent/New Systemic Disease (Including Biochemical Recurrence), Castration Resistant, M1, Prior Novel Hormonal Agent, No Molecular Alteration or Targeted Therapy Exhausted, No Prior Docetaxel ?Histology: Adenocarcinoma ?Therapeutic Status: Recurrent/New Systemic Disease (Including Biochemical Recurrence) ? ?Intent of Therapy: ?Curative Intent, Discussed with Patient ?

## 2023-10-16 ENCOUNTER — Other Ambulatory Visit: Payer: Self-pay

## 2023-10-16 ENCOUNTER — Telehealth: Payer: Self-pay | Admitting: Hematology and Oncology

## 2023-10-17 ENCOUNTER — Other Ambulatory Visit: Payer: Self-pay

## 2023-10-21 ENCOUNTER — Other Ambulatory Visit: Payer: Self-pay | Admitting: Hematology and Oncology

## 2023-10-21 DIAGNOSIS — C61 Malignant neoplasm of prostate: Secondary | ICD-10-CM

## 2023-10-21 NOTE — Progress Notes (Signed)

## 2023-10-27 ENCOUNTER — Inpatient Hospital Stay: Payer: Medicare Other | Attending: Hematology and Oncology

## 2023-10-27 DIAGNOSIS — Z7952 Long term (current) use of systemic steroids: Secondary | ICD-10-CM | POA: Insufficient documentation

## 2023-10-27 DIAGNOSIS — C61 Malignant neoplasm of prostate: Secondary | ICD-10-CM | POA: Insufficient documentation

## 2023-10-27 DIAGNOSIS — E876 Hypokalemia: Secondary | ICD-10-CM | POA: Insufficient documentation

## 2023-10-27 DIAGNOSIS — Z5111 Encounter for antineoplastic chemotherapy: Secondary | ICD-10-CM | POA: Insufficient documentation

## 2023-10-27 DIAGNOSIS — Z5189 Encounter for other specified aftercare: Secondary | ICD-10-CM | POA: Insufficient documentation

## 2023-10-27 DIAGNOSIS — C7951 Secondary malignant neoplasm of bone: Secondary | ICD-10-CM | POA: Insufficient documentation

## 2023-10-28 ENCOUNTER — Inpatient Hospital Stay: Payer: Medicare Other

## 2023-10-28 ENCOUNTER — Other Ambulatory Visit: Payer: Self-pay

## 2023-10-28 ENCOUNTER — Inpatient Hospital Stay (HOSPITAL_BASED_OUTPATIENT_CLINIC_OR_DEPARTMENT_OTHER): Payer: Medicare Other | Admitting: Physician Assistant

## 2023-10-28 VITALS — BP 154/80 | HR 85 | Temp 97.9°F | Resp 17 | Wt 240.6 lb

## 2023-10-28 VITALS — BP 135/83 | HR 67 | Temp 98.2°F | Resp 18

## 2023-10-28 DIAGNOSIS — C61 Malignant neoplasm of prostate: Secondary | ICD-10-CM

## 2023-10-28 DIAGNOSIS — Z5189 Encounter for other specified aftercare: Secondary | ICD-10-CM | POA: Diagnosis not present

## 2023-10-28 DIAGNOSIS — Z5111 Encounter for antineoplastic chemotherapy: Secondary | ICD-10-CM | POA: Diagnosis present

## 2023-10-28 DIAGNOSIS — E876 Hypokalemia: Secondary | ICD-10-CM | POA: Diagnosis not present

## 2023-10-28 DIAGNOSIS — C7951 Secondary malignant neoplasm of bone: Secondary | ICD-10-CM | POA: Diagnosis present

## 2023-10-28 DIAGNOSIS — Z7952 Long term (current) use of systemic steroids: Secondary | ICD-10-CM | POA: Diagnosis not present

## 2023-10-28 LAB — CMP (CANCER CENTER ONLY)
ALT: 19 U/L (ref 0–44)
AST: 15 U/L (ref 15–41)
Albumin: 3.9 g/dL (ref 3.5–5.0)
Alkaline Phosphatase: 197 U/L — ABNORMAL HIGH (ref 38–126)
Anion gap: 7 (ref 5–15)
BUN: 18 mg/dL (ref 8–23)
CO2: 30 mmol/L (ref 22–32)
Calcium: 8.7 mg/dL — ABNORMAL LOW (ref 8.9–10.3)
Chloride: 102 mmol/L (ref 98–111)
Creatinine: 0.96 mg/dL (ref 0.61–1.24)
GFR, Estimated: >60 mL/min (ref >60)
Glucose, Bld: 167 mg/dL — ABNORMAL HIGH (ref 70–99)
Potassium: 3.4 mmol/L — ABNORMAL LOW (ref 3.5–5.1)
Sodium: 139 mmol/L (ref 135–145)
Total Bilirubin: 1.5 mg/dL — ABNORMAL HIGH (ref 0.0–1.2)
Total Protein: 6.3 g/dL — ABNORMAL LOW (ref 6.5–8.1)

## 2023-10-28 LAB — CBC WITH DIFFERENTIAL (CANCER CENTER ONLY)
Abs Immature Granulocytes: 0.03 10*3/uL (ref 0.00–0.07)
Basophils Absolute: 0 10*3/uL (ref 0.0–0.1)
Basophils Relative: 1 %
Eosinophils Absolute: 0.1 10*3/uL (ref 0.0–0.5)
Eosinophils Relative: 3 %
HCT: 36.6 % — ABNORMAL LOW (ref 39.0–52.0)
Hemoglobin: 12.3 g/dL — ABNORMAL LOW (ref 13.0–17.0)
Immature Granulocytes: 1 %
Lymphocytes Relative: 27 %
Lymphs Abs: 1 10*3/uL (ref 0.7–4.0)
MCH: 31.1 pg (ref 26.0–34.0)
MCHC: 33.6 g/dL (ref 30.0–36.0)
MCV: 92.4 fL (ref 80.0–100.0)
Monocytes Absolute: 0.3 10*3/uL (ref 0.1–1.0)
Monocytes Relative: 7 %
Neutro Abs: 2.1 10*3/uL (ref 1.7–7.7)
Neutrophils Relative %: 61 %
Platelet Count: 164 10*3/uL (ref 150–400)
RBC: 3.96 MIL/uL — ABNORMAL LOW (ref 4.22–5.81)
RDW: 12.8 % (ref 11.5–15.5)
WBC Count: 3.5 10*3/uL — ABNORMAL LOW (ref 4.0–10.5)
nRBC: 0 % (ref 0.0–0.2)

## 2023-10-28 MED ORDER — SODIUM CHLORIDE 0.9 % IV SOLN: INTRAVENOUS | Status: DC

## 2023-10-28 MED ORDER — DEXAMETHASONE SODIUM PHOSPHATE 10 MG/ML IJ SOLN
10.0000 mg | Freq: Once | INTRAMUSCULAR | Status: AC
  Administered 2023-10-28: 10 mg via INTRAVENOUS
  Filled 2023-10-28: qty 1

## 2023-10-28 MED ORDER — SODIUM CHLORIDE 0.9 % IV SOLN
75.0000 mg/m2 | Freq: Once | INTRAVENOUS | Status: AC
  Administered 2023-10-28: 179 mg via INTRAVENOUS
  Filled 2023-10-28: qty 17.9

## 2023-10-28 NOTE — Progress Notes (Signed)
 [facility] [phone]   [phone]  PROGRESS NOTE  Patient Care Team: Henriette Anes, DO as PCP - General (Family Medicine)  Hematological/Oncological History # Metastatic Castrate Sensitive Prostate Cancer  09/30/2021: percutaneous nephrostomy tube placement and retroperitoneal lymph node biopsy  10/01/2021: Firmagon  240 mg  09/2021: radiation therapy to the lumbar spine after receiving 30 Gray in 10 fractions  11/01/2021: started Eligard  30 mg every 4 months and Zytiga  1000 mg daily with prednisone  5 mg daily  09/25/2022: last visit with Dr. Amadeo 10/29/2022: transition care to Dr. Federico. Insurance no longer assisting with Zytiga  1000 mg daily, converting enzalutamide  160 mg PO daily.   08/22/2023: PET scan shows progression 10/28/2023: cycle 1, day 1  of docetaxel   Interval History:  Erik Page 73 y.o. male with medical history significant for [facility] presents for a follow up visit. The patient's last visit was on 10/08/2023. He presents today to start cycle 1, day 1 of docetaxel .    On exam today Erik Page reports he has good energy levels. He does have some back pain but contributes this to doing yard work for the last few days. He takes ibuprofen  as needed with relief. He has a good appetite without any significant weight changes. He denies nausea, vomiting or bowel habit changes. He denies easy bruising or signs of bleeding. He denies fevers, chills, sweats, shortness of breath, chest pain or cough. He has no other complaints. Overall he feels well and is willing and able to proceed with therapy at this time.  A full 10 point ROS was otherwise negative.   MEDICAL HISTORY:  Past Medical History:  Diagnosis Date   HTN (hypertension)    Myocardial infarction [facility])     SURGICAL HISTORY: Past Surgical History:  Procedure Laterality Date   CARDIAC CATHETERIZATION N/A 08/15/2015   Procedure: Left Heart Cath and Coronary Angiography;  Surgeon: Alm LELON Clay, MD;  Location: [facility] INVASIVE CV LAB;  Service: Cardiovascular;  Laterality: N/A;   CARDIAC CATHETERIZATION N/A 08/15/2015   Procedure: Left Heart Cath and Coronary Angiography;  Surgeon: Alm LELON Clay, MD;  Location: [facility] INVASIVE CV LAB;  Service: Cardiovascular;  Laterality: N/A;   CARDIAC CATHETERIZATION N/A 08/15/2015   Procedure: Coronary Stent Intervention;  Surgeon: Alm LELON Clay, MD;  Location: [facility] INVASIVE CV LAB;  Service: Cardiovascular;  Laterality: N/A;   IR CONVERT RIGHT NEPHROSTOMY TO NEPHROURETERAL CATH  11/12/2021   IR EXT NEPHROURETERAL CATH EXCHANGE  01/07/2022   IR NEPHROSTOMY EXCHANGE RIGHT  02/20/2022   IR NEPHROSTOMY PLACEMENT RIGHT  10/01/2021   TONSILLECTOMY      SOCIAL HISTORY: Social History   Socioeconomic History   Marital status: Married    Spouse name: Not on file   Number of children: Not on file   Years of education: Not on file   Highest education level: Not on file  Occupational History   Not on file  Tobacco Use   Smoking status: Never   Smokeless tobacco: Never  Vaping Use   Vaping status: Never Used  Substance and Sexual Activity   Alcohol use: No    Alcohol/week: 0.0 standard drinks of alcohol   Drug use: No   Sexual activity: Not on file  Other Topics Concern   Not on file  Social History Narrative   Works as a Pensions Consultant; 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  MEDICATIONS:  Current Outpatient Medications  Medication Sig Dispense Refill   abiraterone  acetate (ZYTIGA ) 250 MG tablet Take 4 tablets (1,000 mg total) by mouth daily. Take on an empty  stomach 1 hour before or 2 hours after a meal 120 tablet 2   aspirin  EC 81 MG EC tablet Take 1 tablet (81 mg total) by mouth daily.     atorvastatin  (LIPITOR ) 80 MG tablet TAKE 1 TABLET BY MOUTH EVERY DAY 90 tablet 1   calcium -vitamin D (OSCAL WITH D) 500-5 MG-MCG tablet Take 1 tablet by mouth 2 (two) times daily. 90 tablet 3   feeding supplement (ENSURE ENLIVE / ENSURE PLUS) LIQD Take 237 mLs by mouth 2 (two) times daily between meals. 237 mL 12   ibuprofen  (ADVIL ) 800 MG tablet Take 1 tablet (800 mg total) by mouth 3 (three) times daily. 90 tablet 1   metoprolol  tartrate (LOPRESSOR ) 25 MG tablet Take 1 tablet (25 mg total) by mouth 2 (two) times daily. 180 tablet 1   nitroGLYCERIN  (NITROSTAT ) 0.4 MG SL tablet DISSOLVE 1 TAB UNDER TONGUE FOR CHEST PAIN - IF PAIN REMAINS AFTER 5 MIN, CALL 911 AND REPEAT DOSE. MAX 3 TABS IN 15 MINUTES (Patient taking differently: 0.4 mg every 5 (five) minutes as needed for chest pain.) 25 tablet 3   ondansetron  (ZOFRAN ) 8 MG tablet Take 1 tablet (8 mg total) by mouth every 8 (eight) hours as needed. 30 tablet 0   oxyCODONE  (OXY IR/ROXICODONE ) 5 MG immediate release tablet Take 2 tablets (10 mg total) by mouth every 4 (four) hours as needed for severe pain. 120 tablet 0   predniSONE  (DELTASONE ) 5 MG tablet TAKE 1 TABLET BY MOUTH EVERY DAY WITH BREAKFAST 90 tablet 3   prochlorperazine  (COMPAZINE ) 10 MG tablet Take 1 tablet (10 mg total) by mouth every 6 (six) hours as needed for nausea or vomiting. 30 tablet 0   senna-docusate (SENOKOT-S) 8.6-50 MG tablet Take 1 tablet by mouth at bedtime as needed for mild constipation. 30 tablet 0   No current facility-administered medications for this visit.    REVIEW OF SYSTEMS:   Constitutional: ( - ) fevers, ( - )  chills , ( - ) night sweats Eyes: ( - ) blurriness of vision, ( - ) double vision, ( - ) watery eyes Ears, nose, mouth, throat, and face: ( - ) mucositis, ( - ) sore throat Respiratory: ( - ) cough, ( - ) dyspnea,  ( - ) wheezes Cardiovascular: ( - ) palpitation, ( - ) chest discomfort, ( - ) lower extremity swelling Gastrointestinal:  ( - ) nausea, ( - ) heartburn, ( - ) change in bowel habits Skin: ( - ) abnormal skin rashes Lymphatics: ( - ) new lymphadenopathy, ( - ) easy bruising Neurological: ( - ) numbness, ( - ) tingling, ( - ) new weaknesses Behavioral/Psych: ( - ) mood change, ( - ) new changes  All other systems were reviewed with the patient and are negative.  PHYSICAL EXAMINATION: ECOG PERFORMANCE STATUS: 0 - Asymptomatic  Vitals:   10/28/23 0907  BP: (!) 154/80  Pulse: 85  Resp: 17  Temp: 97.9  F (36.6 C)  SpO2: 98%    Filed Weights   10/28/23 0907  Weight: 240 lb 9.6 oz (109.1 kg)    GENERAL: Well-appearing elderly African-American male, alert, no distress and comfortable SKIN: skin color, texture, turgor are normal, no rashes or significant lesions EYES: conjunctiva are pink and non-injected, sclera clear LUNGS: clear to auscultation and percussion with normal breathing effort HEART: regular rate & rhythm and no murmurs and no lower extremity edema Musculoskeletal: no cyanosis of digits and no clubbing  PSYCH: alert & oriented x 3, fluent speech NEURO: no focal motor/sensory deficits  LABORATORY DATA:  I have reviewed the data as listed    Latest Ref Rng & Units 10/28/2023    8:28 AM 10/08/2023    8:41 AM 08/27/2023    8:39 AM  CBC  WBC 4.0 - 10.5 K/uL 3.5  5.3  4.9   Hemoglobin 13.0 - 17.0 g/dL Page.6  Page.7  Page.0   Hematocrit 39.0 - 52.0 % 36.6  35.7  38.7   Platelets 150 - 400 K/uL 164  198  180        Latest Ref Rng & Units 10/28/2023    8:28 AM 10/08/2023    8:41 AM 08/27/2023    8:39 AM  CMP  Glucose 70 - 99 mg/dL 832  805  842   BUN 8 - 23 mg/dL 18  16  16    Creatinine 0.61 - 1.24 mg/dL 9.03  9.13  9.12   Sodium 135 - 145 mmol/L 139  139  142   Potassium 3.5 - 5.1 mmol/L 3.4  3.7  3.8   Chloride 98 - 111 mmol/L 102  102  101   CO2 22 - 32 mmol/L 30  31   34   Calcium  8.9 - 10.3 mg/dL 8.7  8.8  9.1   Total Protein 6.5 - 8.1 g/dL 6.3  6.4  6.4   Total Bilirubin 0.0 - 1.2 mg/dL 1.5  1.3  1.5   Alkaline Phos 38 - 126 U/L 197  174  132   AST 15 - 41 U/L 15  13  13    ALT 0 - 44 U/L 19  21  16      RADIOGRAPHIC STUDIES: No results found.   ASSESSMENT & PLAN Erik Page is a 74 y.o. male with medical history significant for [facility] presents for a follow up visit.   # Metastatic Castrate Sensitive Prostate Cancer  --continue lupron  30mg  subq q 4 months indefinitely  --started Eligard  30 mg every 4 months and Zytiga  1000 mg daily with prednisone  5 mg daily on 11/01/2021:   --transition from Zytiga  to enzalutamide  160mg  PO daily due to cost. Was able to get approval to return to Zytiga  due to faillure of enzalutamide .  --PET scan from 08/22/2023 showed progression with multiple new and progressive bone lesions.  --Recommend to switch to docetaxel  q 3 weeks.  PLAN: --Due for cycle 1, day 1 of docetaxel  today --Labs today show white blood cell count 3.5, hemoglobin 12.3, MCV 92.4, platelets 164, creatinine and LFTs are normal.  -- Last PSA 244 on 10/08/2023  --Will administer Xgeva  q 2 months, last one was 10/08/2023 so next one will be due around March 2025.  --Will administer Lupron  every 4 months, last one was on 08/06/2023 so next one will be due around March 2024 -- RTC in 3 weeks with labs and follow up before Cycle 2, Day 1.  #Hypokalemia: --Potassium level is 3.4 today --Advised to  incorporate potassium rich foods into his diet.    No orders of the defined types were placed in this encounter.   All questions were answered. The patient knows to call the clinic with any problems, questions or concerns.  A total of more than 30 minutes were spent on this encounter with face-to-face time and non-face-to-face time, including preparing to see the patient, ordering tests and/or medications, counseling the patient and coordination of care as  outlined above.   Johnston Police PA-C Dept of Hematology and Oncology [facility] at [facility] Phone: [phone]   10/28/2023 9:46 AM

## 2023-10-28 NOTE — Patient Instructions (Signed)
[facility] - A DEPT OF [facility]  Discharge Instructions: Thank you for choosing [facility] to provide your oncology and hematology care.   If you have a lab appointment with the Cancer Center, please go directly to the Cancer Center and check in at the registration area.   Wear comfortable clothing and clothing appropriate for easy access to any Portacath or PICC line.   We strive to give you quality time with your provider. You may need to reschedule your appointment if you arrive late (15 or more minutes).  Arriving late affects you and other patients whose appointments are after yours.  Also, if you miss three or more appointments without notifying the office, you may be dismissed from the clinic at the provider's discretion.      For prescription refill requests, have your pharmacy contact our office and allow 72 hours for refills to be completed.    Today you received the following chemotherapy and/or immunotherapy agent: Docetaxel (Taxotere)      To help prevent nausea and vomiting after your treatment, we encourage you to take your nausea medication as directed.  BELOW ARE SYMPTOMS THAT SHOULD BE REPORTED IMMEDIATELY: *FEVER GREATER THAN 100.4 F (38 C) OR HIGHER *CHILLS OR SWEATING *NAUSEA AND VOMITING THAT IS NOT CONTROLLED WITH YOUR NAUSEA MEDICATION *UNUSUAL SHORTNESS OF BREATH *UNUSUAL BRUISING OR BLEEDING *URINARY PROBLEMS (pain or burning when urinating, or frequent urination) *BOWEL PROBLEMS (unusual diarrhea, constipation, pain near the anus) TENDERNESS IN MOUTH AND THROAT WITH OR WITHOUT PRESENCE OF ULCERS (sore throat, sores in mouth, or a toothache) UNUSUAL RASH, SWELLING OR PAIN  UNUSUAL VAGINAL DISCHARGE OR ITCHING   Items with * indicate a potential emergency and should be followed up as soon as possible or go to the Emergency Department if any problems should occur.  Please show the CHEMOTHERAPY ALERT CARD or  IMMUNOTHERAPY ALERT CARD at check-in to the Emergency Department and triage nurse.  Should you have questions after your visit or need to cancel or reschedule your appointment, please contact [facility] - A DEPT OF [facility]  Dept: [phone]  and follow the prompts.  Office hours are 8:00 a.m. to 4:30 p.m. Monday - Friday. Please note that voicemails left after 4:00 p.m. may not be returned until the following business day.  We are closed weekends and major holidays. You have access to a nurse at all times for urgent questions. Please call the main number to the clinic Dept: [phone] and follow the prompts.   For any non-urgent questions, you may also contact your provider using MyChart. We now offer e-Visits for anyone 30 and older to request care online for non-urgent symptoms. For details visit [email or web address].   Also download the MyChart app! Go to the app store, search "MyChart", open the app, select [place], and log in with your MyChart username and password.  Docetaxel Injection What is this medication? DOCETAXEL (doe se TAX el) treats some types of cancer. It works by slowing down the growth of cancer cells. This medicine may be used for other purposes; ask your health care provider or pharmacist if you have questions. COMMON BRAND NAME(S): BEIZRAY, Docefrez, Docivyx, Taxotere What should I tell my care team before I take this medication? They need to know if you have any of these conditions: Kidney disease Liver disease Low white blood cell levels Tingling of the fingers or toes or  other nerve disorder An unusual or allergic reaction to docetaxel, polysorbate 80, other medications, foods, dyes, or preservatives Pregnant or trying to get pregnant Breast-feeding How should I use this medication? This medication is injected into a vein. It is given by your care team in a hospital or clinic setting. Talk to your care team about the  use of this medication in children. Special care may be needed. Overdosage: If you think you have taken too much of this medicine contact a poison control center or emergency room at once. NOTE: This medicine is only for you. Do not share this medicine with others. What if I miss a dose? Keep appointments for follow-up doses. It is important not to miss your dose. Call your care team if you are unable to keep an appointment. What may interact with this medication? Do not take this medication with any of the following: Live virus vaccines This medication may also interact with the following: Certain antibiotics, such as clarithromycin, telithromycin Certain antivirals for HIV or hepatitis Certain medications for fungal infections, such as itraconazole, ketoconazole, voriconazole Grapefruit juice Nefazodone Supplements, such as St. John's wort This list may not describe all possible interactions. Give your health care provider a list of all the medicines, herbs, non-prescription drugs, or dietary supplements you use. Also tell them if you smoke, drink alcohol, or use illegal drugs. Some items may interact with your medicine. What should I watch for while using this medication? This medication may make you feel generally unwell. This is not uncommon as chemotherapy can affect healthy cells as well as cancer cells. Report any side effects. Continue your course of treatment even though you feel ill unless your care team tells you to stop. You may need blood work done while you are taking this medication. This medication can cause serious side effects and infusion reactions. To reduce the risk, your care team may give you other medications to take before receiving this one. Be sure to follow the directions from your care team. This medication may increase your risk of getting an infection. Call your care team for advice if you get a fever, chills, sore throat, or other symptoms of a cold or flu. Do not  treat yourself. Try to avoid being around people who are sick. Avoid taking medications that contain aspirin, acetaminophen, ibuprofen, naproxen, or ketoprofen unless instructed by your care team. These medications may hide a fever. Be careful brushing or flossing your teeth or using a toothpick because you may get an infection or bleed more easily. If you have any dental work done, tell your dentist you are receiving this medication. Some products may contain alcohol. Ask your care team if this medication contains alcohol. Be sure to tell all care teams you are taking this medicine. Certain medications, like metronidazole and disulfiram, can cause an unpleasant reaction when taken with alcohol. The reaction includes flushing, headache, nausea, vomiting, sweating, and increased thirst. The reaction can last from 30 minutes to several hours. This medication may affect your coordination, reaction time, or judgement. Do not drive or operate machinery until you know how this medication affects you. Sit up or stand slowly to reduce the risk of dizzy or fainting spells. Drinking alcohol with this medication can increase the risk of these side effects. Talk to your care team about your risk of cancer. You may be more at risk for certain types of cancer if you take this medication. Talk to your care team if you wish to become pregnant  or think you might be pregnant. This medication can cause serious birth defects if taken during pregnancy or if you get pregnant within 2 months after stopping therapy. A negative pregnancy test is required before starting this medication. A reliable form of contraception is recommended while taking this medication and for 2 months after stopping it. Talk to your care team about reliable forms of contraception. Do not breast-feed while taking this medication and for 1 week after stopping therapy. Use a condom during sex and for 4 months after stopping therapy. Tell your care team right  away if you think your partner might be pregnant. This medication can cause serious birth defects. This medication may cause infertility. Talk to your care team if you are concerned about your fertility. What side effects may I notice from receiving this medication? Side effects that you should report to your care team as soon as possible: Allergic reactions--skin rash, itching, hives, swelling of the face, lips, tongue, or throat Change in vision such as blurry vision, seeing halos around lights, vision loss Infection--fever, chills, cough, or sore throat Infusion reactions--chest pain, shortness of breath or trouble breathing, feeling faint or lightheaded Low red blood cell level--unusual weakness or fatigue, dizziness, headache, trouble breathing Pain, tingling, or numbness in the hands or feet Painful swelling, warmth, or redness of the skin, blisters or sores at the infusion site Redness, blistering, peeling, or loosening of the skin, including inside the mouth Sudden or severe stomach pain, bloody diarrhea, fever, nausea, vomiting Swelling of the ankles, hands, or feet Tumor lysis syndrome (TLS)--nausea, vomiting, diarrhea, decrease in the amount of urine, dark urine, unusual weakness or fatigue, confusion, muscle pain or cramps, fast or irregular heartbeat, joint pain Unusual bruising or bleeding Side effects that usually do not require medical attention (report to your care team if they continue or are bothersome): Change in nail shape, thickness, or color Change in taste Hair loss Increased tears This list may not describe all possible side effects. Call your doctor for medical advice about side effects. You may report side effects to FDA at 1-800-FDA-1088. Where should I keep my medication? This medication is given in a hospital or clinic. It will not be stored at home. NOTE: This sheet is a summary. It may not cover all possible information. If you have questions about this  medicine, talk to your doctor, pharmacist, or health care provider.  2024 Elsevier/Gold Standard (2021-11-15 00:00:00)

## 2023-10-29 ENCOUNTER — Encounter: Payer: Self-pay | Admitting: Hematology and Oncology

## 2023-10-29 NOTE — Telephone Encounter (Signed)
-----   Message from Nurse Ruffus Couch sent at 10/28/2023  1:12 PM EST ----- Regarding: Dr. Rosaline Coma Erik Page. First time Docetaxel  tolerated well no issues noted. Thank you! 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

## 2023-10-29 NOTE — Telephone Encounter (Signed)
 Called & left message on home # to call back to speak with Dr Les Rao nurse to  let us  know how he did with hid treatment.

## 2023-10-30 ENCOUNTER — Other Ambulatory Visit: Payer: Self-pay

## 2023-10-30 ENCOUNTER — Inpatient Hospital Stay: Payer: Medicare Other

## 2023-10-30 VITALS — BP 119/74 | HR 88 | Temp 99.0°F | Resp 18

## 2023-10-30 DIAGNOSIS — Z5111 Encounter for antineoplastic chemotherapy: Secondary | ICD-10-CM | POA: Diagnosis not present

## 2023-10-30 DIAGNOSIS — C61 Malignant neoplasm of prostate: Secondary | ICD-10-CM

## 2023-10-30 MED ORDER — PEGFILGRASTIM-FPGK 6 MG/0.6ML ~~LOC~~ SOSY
6.0000 mg | PREFILLED_SYRINGE | Freq: Once | SUBCUTANEOUS | Status: AC
Start: 2023-10-30 — End: 2023-10-30
  Administered 2023-10-30: 6 mg via SUBCUTANEOUS
  Filled 2023-10-30: qty 0.6

## 2023-11-17 ENCOUNTER — Other Ambulatory Visit: Payer: Self-pay | Admitting: Hematology and Oncology

## 2023-11-18 ENCOUNTER — Inpatient Hospital Stay (HOSPITAL_BASED_OUTPATIENT_CLINIC_OR_DEPARTMENT_OTHER): Payer: Medicare Other | Admitting: Hematology and Oncology

## 2023-11-18 ENCOUNTER — Other Ambulatory Visit: Payer: Self-pay | Admitting: Hematology and Oncology

## 2023-11-18 ENCOUNTER — Inpatient Hospital Stay: Payer: Medicare Other

## 2023-11-18 VITALS — BP 122/75 | HR 86 | Temp 98.3°F | Resp 16 | Wt 237.3 lb

## 2023-11-18 DIAGNOSIS — C61 Malignant neoplasm of prostate: Secondary | ICD-10-CM | POA: Diagnosis not present

## 2023-11-18 DIAGNOSIS — Z5111 Encounter for antineoplastic chemotherapy: Secondary | ICD-10-CM | POA: Diagnosis not present

## 2023-11-18 LAB — CBC WITH DIFFERENTIAL (CANCER CENTER ONLY)
Abs Immature Granulocytes: 0.09 10*3/uL — ABNORMAL HIGH (ref 0.00–0.07)
Basophils Absolute: 0.1 10*3/uL (ref 0.0–0.1)
Basophils Relative: 1 %
Eosinophils Absolute: 0 10*3/uL (ref 0.0–0.5)
Eosinophils Relative: 0 %
HCT: 34.9 % — ABNORMAL LOW (ref 39.0–52.0)
Hemoglobin: 11.5 g/dL — ABNORMAL LOW (ref 13.0–17.0)
Immature Granulocytes: 2 %
Lymphocytes Relative: 18 %
Lymphs Abs: 1.1 10*3/uL (ref 0.7–4.0)
MCH: 31.4 pg (ref 26.0–34.0)
MCHC: 33 g/dL (ref 30.0–36.0)
MCV: 95.4 fL (ref 80.0–100.0)
Monocytes Absolute: 0.4 10*3/uL (ref 0.1–1.0)
Monocytes Relative: 7 %
Neutro Abs: 4.3 10*3/uL (ref 1.7–7.7)
Neutrophils Relative %: 72 %
Platelet Count: 231 10*3/uL (ref 150–400)
RBC: 3.66 MIL/uL — ABNORMAL LOW (ref 4.22–5.81)
RDW: 14.1 % (ref 11.5–15.5)
WBC Count: 5.9 10*3/uL (ref 4.0–10.5)
nRBC: 0.5 % — ABNORMAL HIGH (ref 0.0–0.2)

## 2023-11-18 LAB — CMP (CANCER CENTER ONLY)
ALT: 17 U/L (ref 0–44)
AST: 15 U/L (ref 15–41)
Albumin: 3.9 g/dL (ref 3.5–5.0)
Alkaline Phosphatase: 162 U/L — ABNORMAL HIGH (ref 38–126)
Anion gap: 5 (ref 5–15)
BUN: 16 mg/dL (ref 8–23)
CO2: 30 mmol/L (ref 22–32)
Calcium: 8.7 mg/dL — ABNORMAL LOW (ref 8.9–10.3)
Chloride: 107 mmol/L (ref 98–111)
Creatinine: 0.88 mg/dL (ref 0.61–1.24)
GFR, Estimated: >60 mL/min (ref >60)
Glucose, Bld: 144 mg/dL — ABNORMAL HIGH (ref 70–99)
Potassium: 4.2 mmol/L (ref 3.5–5.1)
Sodium: 142 mmol/L (ref 135–145)
Total Bilirubin: 1.2 mg/dL (ref 0.0–1.2)
Total Protein: 6.2 g/dL — ABNORMAL LOW (ref 6.5–8.1)

## 2023-11-18 MED ORDER — SODIUM CHLORIDE 0.9 % IV SOLN: INTRAVENOUS | Status: DC

## 2023-11-18 MED ORDER — IBUPROFEN 800 MG PO TABS: 800.0000 mg | ORAL_TABLET | Freq: Three times a day (TID) | ORAL | 1 refills | Status: DC

## 2023-11-18 MED ORDER — DENOSUMAB 120 MG/1.7ML ~~LOC~~ SOLN: 120.0000 mg | Freq: Once | SUBCUTANEOUS | Status: DC

## 2023-11-18 MED ORDER — SODIUM CHLORIDE 0.9 % IV SOLN
75.0000 mg/m2 | Freq: Once | INTRAVENOUS | Status: AC
  Administered 2023-11-18: 179 mg via INTRAVENOUS
  Filled 2023-11-18: qty 17.9

## 2023-11-18 MED ORDER — ONDANSETRON HCL 8 MG PO TABS: 8.0000 mg | ORAL_TABLET | Freq: Three times a day (TID) | ORAL | 0 refills | Status: DC | PRN

## 2023-11-18 MED ORDER — DEXAMETHASONE SODIUM PHOSPHATE 10 MG/ML IJ SOLN
10.0000 mg | Freq: Once | INTRAMUSCULAR | Status: AC
  Administered 2023-11-18: 10 mg via INTRAVENOUS
  Filled 2023-11-18: qty 1

## 2023-11-18 NOTE — Progress Notes (Deleted)
 Per Dr. Leonides Schanz, OK to administer [facility] today with corrected calcium 8.78.

## 2023-11-18 NOTE — Progress Notes (Signed)
 [facility] [phone]   [phone]  PROGRESS NOTE  Patient Care Team: Lorelei Pont, DO as PCP - General (Family Medicine)  Hematological/Oncological History # Metastatic Castrate Sensitive Prostate Cancer  09/30/2021: percutaneous nephrostomy tube placement and retroperitoneal lymph node biopsy  10/01/2021: Deborra Medina 240 mg  09/2021: radiation therapy to the lumbar spine after receiving 30 Gray in 10 fractions  11/01/2021: started Eligard 30 mg every 4 months and Zytiga 1000 mg daily with prednisone 5 mg daily  09/25/2022: last visit with Dr. Clelia Croft 10/29/2022: transition care to Dr. Leonides Schanz. Insurance no longer assisting with Zytiga 1000 mg daily, converting enzalutamide 160 mg PO daily.   08/22/2023: PET scan shows progression 10/28/2023: cycle 1, day 1  of docetaxel  Interval History:  Yonael [age] y.o. male with medical history significant for [facility] presents for a follow up visit. The patient's last visit was on 10/28/2023. He presents today to start cycle 2, day 1 of docetaxel.   On exam today Mr. [name] reports reports that he tolerated his treatment well for the first 3 days but then afterwards developed severe nausea, vomiting, and diarrhea.  He reports he had a big drop in energy and after the diarrhea developed constipation.  He also reports that his taste became for and he lost a desire to eat.  He reports that he did use Imodium and Zofran while this was occurring.  He reports that otherwise the treatment did not cause any neuropathy, fevers, chills, sweats.  He also reports that he feels that he is rebounding and that the first week after treatment was the worst.. Overall he feels well and is willing and able to proceed with therapy at this time.  A full 10 point ROS was otherwise negative.  MEDICAL HISTORY:  Past Medical History:  Diagnosis Date   HTN (hypertension)    Myocardial infarction [facility])     SURGICAL HISTORY: Past Surgical History:   Procedure Laterality Date   CARDIAC CATHETERIZATION N/A 08/15/2015   Procedure: Left Heart Cath and Coronary Angiography;  Surgeon: Marykay Lex, MD;  Location: [facility] INVASIVE CV LAB;  Service: Cardiovascular;  Laterality: N/A;   CARDIAC CATHETERIZATION N/A 08/15/2015   Procedure: Left Heart Cath and Coronary Angiography;  Surgeon: Marykay Lex, MD;  Location: [facility] INVASIVE CV LAB;  Service: Cardiovascular;  Laterality: N/A;   CARDIAC CATHETERIZATION N/A 08/15/2015   Procedure: Coronary Stent Intervention;  Surgeon: Marykay Lex, MD;  Location: [facility] INVASIVE CV LAB;  Service: Cardiovascular;  Laterality: N/A;   IR CONVERT RIGHT NEPHROSTOMY TO NEPHROURETERAL CATH  11/12/2021   IR EXT NEPHROURETERAL CATH EXCHANGE  01/07/2022   IR NEPHROSTOMY EXCHANGE RIGHT  02/20/2022   IR NEPHROSTOMY PLACEMENT RIGHT  10/01/2021   TONSILLECTOMY      SOCIAL HISTORY: Social History   Socioeconomic History   Marital status: Married    Spouse name: Not on file   Number of children: Not on file   Years of education: Not on file   Highest education level: Not on file  Occupational History   Not on file  Tobacco Use   Smoking status: Never   Smokeless tobacco: Never  Vaping Use   Vaping status: Never Used  Substance and Sexual Activity   Alcohol use: No    Alcohol/week: 0.0 standard drinks of alcohol   Drug use: No   Sexual activity: Not on file  Other Topics Concern   Not on file  Social History Narrative   Works as  a Pensions consultant; 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  MEDICATIONS:  Current Outpatient Medications   Medication Sig Dispense Refill   aspirin EC 81 MG EC tablet Take 1 tablet (81 mg total) by mouth daily.     atorvastatin (LIPITOR) 80 MG tablet TAKE 1 TABLET BY MOUTH EVERY DAY 90 tablet 1   calcium-vitamin D (OSCAL WITH D) 500-5 MG-MCG tablet Take 1 tablet by mouth 2 (two) times daily. 90 tablet 3   feeding supplement (ENSURE ENLIVE / ENSURE PLUS) LIQD Take 237 mLs by mouth 2 (two) times daily between meals. 237 mL 12   metoprolol tartrate (LOPRESSOR) 25 MG tablet Take 1 tablet (25 mg total) by mouth 2 (two) times daily. 180 tablet 1   nitroGLYCERIN (NITROSTAT) 0.4 MG SL tablet DISSOLVE 1 TAB UNDER TONGUE FOR CHEST PAIN - IF PAIN REMAINS AFTER 5 MIN, CALL 911 AND REPEAT DOSE. MAX 3 TABS IN 15 MINUTES (Patient taking differently: 0.4 mg every 5 (five) minutes as needed for chest pain.) 25 tablet 3   oxyCODONE (OXY IR/ROXICODONE) 5 MG immediate release tablet Take 2 tablets (10 mg total) by mouth every 4 (four) hours as needed for severe pain. 120 tablet 0   prochlorperazine (COMPAZINE) 10 MG tablet Take 1 tablet (10 mg total) by mouth every 6 (six) hours as needed for nausea or vomiting. 30 tablet 0   senna-docusate (SENOKOT-S) 8.6-50 MG tablet Take 1 tablet by mouth at bedtime as needed for mild constipation. 30 tablet 0   ibuprofen (ADVIL) 800 MG tablet Take 1 tablet (800 mg total) by mouth 3 (three) times daily. 90 tablet 1   ondansetron (ZOFRAN) 8 MG tablet Take 1 tablet (8 mg total) by mouth every 8 (eight) hours as needed. 30 tablet 0   No current facility-administered medications for this visit.   Facility-Administered Medications Ordered in Other Visits  Medication Dose Route Frequency Provider Last Rate Last Admin   0.9 %  sodium chloride infusion   Intravenous Continuous Jaci Standard, MD   Stopped at 11/18/23 1300    REVIEW OF SYSTEMS:   Constitutional: ( - ) fevers, ( - )  chills , ( - ) night sweats Eyes: ( - ) blurriness of vision, ( - ) double vision, ( - ) watery  eyes Ears, nose, mouth, throat, and face: ( - ) mucositis, ( - ) sore throat Respiratory: ( - ) cough, ( - ) dyspnea, ( - ) wheezes Cardiovascular: ( - ) palpitation, ( - ) chest discomfort, ( - ) lower extremity swelling Gastrointestinal:  ( - ) nausea, ( - ) heartburn, ( - ) change in bowel habits Skin: ( - ) abnormal skin rashes Lymphatics: ( - ) new lymphadenopathy, ( - ) easy bruising Neurological: ( - ) numbness, ( - ) tingling, ( - ) new weaknesses Behavioral/Psych: ( - ) mood change, ( - ) new changes  All other systems were reviewed with the patient and are negative.  PHYSICAL EXAMINATION: ECOG PERFORMANCE STATUS: 0 - Asymptomatic  Vitals:  11/18/23 1006  BP: 122/75  Pulse: 86  Resp: 16  Temp: 98.3 F (36.8 C)  SpO2: 99%     Filed Weights   11/18/23 1006  Weight: 237 lb 4.8 oz (107.6 kg)     GENERAL: Well-appearing elderly African-American male, alert, no distress and comfortable SKIN: skin color, texture, turgor are normal, no rashes or significant lesions EYES: conjunctiva are pink and non-injected, sclera clear LUNGS: clear to auscultation and percussion with normal breathing effort HEART: regular rate & rhythm and no murmurs and no lower extremity edema Musculoskeletal: no cyanosis of digits and no clubbing  PSYCH: alert & oriented x 3, fluent speech NEURO: no focal motor/sensory deficits  LABORATORY DATA:  I have reviewed the data as listed    Latest Ref Rng & Units 11/18/2023    9:32 AM 10/28/2023    8:28 AM 10/08/2023    8:41 AM  CBC  WBC 4.0 - 10.5 K/uL 5.9  3.5  5.3   Hemoglobin 13.0 - 17.0 g/dL 11.9  14.7  82.9   Hematocrit 39.0 - 52.0 % 34.9  36.6  35.7   Platelets 150 - 400 K/uL 231  164  198        Latest Ref Rng & Units 11/18/2023    9:32 AM 10/28/2023    8:28 AM 10/08/2023    8:41 AM  CMP  Glucose 70 - 99 mg/dL 562  130  865   BUN 8 - 23 mg/dL 16  18  16    Creatinine 0.61 - 1.24 mg/dL 7.84  6.96  2.95   Sodium 135 - 145 mmol/L 142  139   139   Potassium 3.5 - 5.1 mmol/L 4.2  3.4  3.7   Chloride 98 - 111 mmol/L 107  102  102   CO2 22 - 32 mmol/L 30  30  31    Calcium 8.9 - 10.3 mg/dL 8.7  8.7  8.8   Total Protein 6.5 - 8.1 g/dL 6.2  6.3  6.4   Total Bilirubin 0.0 - 1.2 mg/dL 1.2  1.5  1.3   Alkaline Phos 38 - 126 U/L 162  197  174   AST 15 - 41 U/L 15  15  13    ALT 0 - 44 U/L 17  19  21      RADIOGRAPHIC STUDIES: No results found.   ASSESSMENT & PLAN Ransome Helwig is a 73 y.o. male with medical history significant for [facility] presents for a follow up visit.   # Metastatic Castrate Sensitive Prostate Cancer  --continue lupron 30mg  subq q 4 months indefinitely  --started Eligard 30 mg every 4 months and Zytiga 1000 mg daily with prednisone 5 mg daily on 11/01/2021:   --transition from Zytiga to enzalutamide 160mg  PO daily due to cost. Was able to get approval to return to Zytiga due to faillure of enzalutamide.  --PET scan from 08/22/2023 showed progression with multiple new and progressive bone lesions.  --Recommend to switch to docetaxel q 3 weeks.  PLAN: --Due for cycle 2, day 1 of docetaxel today --Labs today show white blood cell count 5.9, Hgb 11.5, MCV 95.4, Plt 231, creatinine and LFTs are normal.  -- Last PSA 244 on 10/08/2023  --Will administer Xgeva q 2 months, last one was 10/08/2023 so next one will be due around March 2025.  --Will administer Lupron every 4 months, last one was on 08/06/2023 so next one will be due around March 2024 -- RTC in 3 weeks with labs and  follow up before Cycle 3, Day 1.  #Hypokalemia: --Potassium level is 4.2 today --Advised to incorporate potassium rich foods into his diet.    No orders of the defined types were placed in this encounter.   All questions were answered. The patient knows to call the clinic with any problems, questions or concerns.  A total of more than 30 minutes were spent on this encounter with face-to-face time and non-face-to-face time, including preparing to  see the patient, ordering tests and/or medications, counseling the patient and coordination of care as outlined above.   Ulysees Barns, MD Department of Hematology/Oncology [facility] Phone: [phone] Pager: [phone] Email: [email or web address]    11/18/2023 3:26 PM

## 2023-11-18 NOTE — Patient Instructions (Addendum)
 [facility] - A DEPT OF [facility]  Discharge Instructions: Thank you for choosing [facility] to provide your oncology and hematology care.   If you have a lab appointment with the Cancer Center, please go directly to the Cancer Center and check in at the registration area.   Wear comfortable clothing and clothing appropriate for easy access to any Portacath or PICC line.   We strive to give you quality time with your provider. You may need to reschedule your appointment if you arrive late (15 or more minutes).  Arriving late affects you and other patients whose appointments are after yours.  Also, if you miss three or more appointments without notifying the office, you may be dismissed from the clinic at the provider's discretion.      For prescription refill requests, have your pharmacy contact our office and allow 72 hours for refills to be completed.    Today you received the following chemotherapy and/or immunotherapy agents Taxotere.      To help prevent nausea and vomiting after your treatment, we encourage you to take your nausea medication as directed.  BELOW ARE SYMPTOMS THAT SHOULD BE REPORTED IMMEDIATELY: *FEVER GREATER THAN 100.4 F (38 C) OR HIGHER *CHILLS OR SWEATING *NAUSEA AND VOMITING THAT IS NOT CONTROLLED WITH YOUR NAUSEA MEDICATION *UNUSUAL SHORTNESS OF BREATH *UNUSUAL BRUISING OR BLEEDING *URINARY PROBLEMS (pain or burning when urinating, or frequent urination) *BOWEL PROBLEMS (unusual diarrhea, constipation, pain near the anus) TENDERNESS IN MOUTH AND THROAT WITH OR WITHOUT PRESENCE OF ULCERS (sore throat, sores in mouth, or a toothache) UNUSUAL RASH, SWELLING OR PAIN  UNUSUAL VAGINAL DISCHARGE OR ITCHING   Items with * indicate a potential emergency and should be followed up as soon as possible or go to the Emergency Department if any problems should occur.  Please show the CHEMOTHERAPY ALERT CARD or IMMUNOTHERAPY  ALERT CARD at check-in to the Emergency Department and triage nurse.  Should you have questions after your visit or need to cancel or reschedule your appointment, please contact [facility] - A DEPT OF [facility]  Dept: [phone]  and follow the prompts.  Office hours are 8:00 a.m. to 4:30 p.m. Monday - Friday. Please note that voicemails left after 4:00 p.m. may not be returned until the following business day.  We are closed weekends and major holidays. You have access to a nurse at all times for urgent questions. Please call the main number to the clinic Dept: [phone] and follow the prompts.   For any non-urgent questions, you may also contact your provider using MyChart. We now offer e-Visits for anyone 69 and older to request care online for non-urgent symptoms. For details visit [email or web address].   Also download the MyChart app! Go to the app store, search "MyChart", open the app, select [facility], and log in with your MyChart username and password.

## 2023-11-19 ENCOUNTER — Other Ambulatory Visit: Payer: Self-pay

## 2023-11-19 LAB — PROSTATE-SPECIFIC AG, SERUM (LABCORP): Prostate Specific Ag, Serum: 468 ng/mL — ABNORMAL HIGH (ref 0.0–4.0)

## 2023-11-20 ENCOUNTER — Inpatient Hospital Stay: Payer: Medicare Other

## 2023-11-20 VITALS — BP 125/76 | HR 94 | Temp 98.0°F | Resp 17

## 2023-11-20 DIAGNOSIS — C61 Malignant neoplasm of prostate: Secondary | ICD-10-CM

## 2023-11-20 DIAGNOSIS — Z5111 Encounter for antineoplastic chemotherapy: Secondary | ICD-10-CM | POA: Diagnosis not present

## 2023-11-20 MED ORDER — PEGFILGRASTIM-FPGK 6 MG/0.6ML ~~LOC~~ SOSY
6.0000 mg | PREFILLED_SYRINGE | Freq: Once | SUBCUTANEOUS | Status: AC
  Administered 2023-11-20: 6 mg via SUBCUTANEOUS
  Filled 2023-11-20: qty 0.6

## 2023-11-25 ENCOUNTER — Telehealth: Payer: Self-pay | Admitting: *Deleted
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
## 2023-11-26 ENCOUNTER — Telehealth: Payer: Self-pay | Admitting: *Deleted

## 2023-11-26 NOTE — Telephone Encounter (Signed)
 TCT patient to check on his status. He had called yesterday feeling quite ill with nausea/diarrhea, muscle aches, weak. Spoke with him this am. Offered to have him seen in the [facility] today. Even though EMS was called to see him yesterday and told him his blood was w Low, pt has decided to stay home to hydrate himself. Advised that he may have alterations in his electrolytes and we would check those here and correct if needed. He still declined. He said he would call in the morning if he was no better. Advised to call back here even if he feels better.  He states he will.

## 2023-12-01 ENCOUNTER — Encounter: Payer: Self-pay | Admitting: Hematology and Oncology

## 2023-12-03 ENCOUNTER — Ambulatory Visit: Payer: Medicare Other | Admitting: Hematology and Oncology

## 2023-12-03 ENCOUNTER — Other Ambulatory Visit: Payer: Medicare Other

## 2023-12-03 ENCOUNTER — Ambulatory Visit: Payer: Medicare Other

## 2023-12-08 ENCOUNTER — Inpatient Hospital Stay: Payer: Medicare Other | Attending: Hematology and Oncology

## 2023-12-08 ENCOUNTER — Inpatient Hospital Stay: Payer: Medicare Other

## 2023-12-08 ENCOUNTER — Inpatient Hospital Stay (HOSPITAL_BASED_OUTPATIENT_CLINIC_OR_DEPARTMENT_OTHER): Payer: Medicare Other | Admitting: Hematology and Oncology

## 2023-12-08 ENCOUNTER — Other Ambulatory Visit: Payer: Self-pay | Admitting: *Deleted

## 2023-12-08 VITALS — BP 124/73 | HR 84 | Temp 97.3°F | Resp 15 | Wt 240.3 lb

## 2023-12-08 VITALS — BP 124/73 | HR 74 | Resp 18

## 2023-12-08 DIAGNOSIS — C7951 Secondary malignant neoplasm of bone: Secondary | ICD-10-CM | POA: Insufficient documentation

## 2023-12-08 DIAGNOSIS — C61 Malignant neoplasm of prostate: Secondary | ICD-10-CM | POA: Insufficient documentation

## 2023-12-08 DIAGNOSIS — Z5189 Encounter for other specified aftercare: Secondary | ICD-10-CM | POA: Diagnosis not present

## 2023-12-08 DIAGNOSIS — Z7952 Long term (current) use of systemic steroids: Secondary | ICD-10-CM | POA: Insufficient documentation

## 2023-12-08 DIAGNOSIS — Z5111 Encounter for antineoplastic chemotherapy: Secondary | ICD-10-CM | POA: Diagnosis present

## 2023-12-08 DIAGNOSIS — E876 Hypokalemia: Secondary | ICD-10-CM | POA: Diagnosis not present

## 2023-12-08 LAB — CBC WITH DIFFERENTIAL (CANCER CENTER ONLY)
Abs Immature Granulocytes: 0.18 10*3/uL — ABNORMAL HIGH (ref 0.00–0.07)
Basophils Absolute: 0.1 10*3/uL (ref 0.0–0.1)
Basophils Relative: 1 %
Eosinophils Absolute: 0 10*3/uL (ref 0.0–0.5)
Eosinophils Relative: 0 %
HCT: 29.8 % — ABNORMAL LOW (ref 39.0–52.0)
Hemoglobin: 9.7 g/dL — ABNORMAL LOW (ref 13.0–17.0)
Immature Granulocytes: 3 %
Lymphocytes Relative: 17 %
Lymphs Abs: 1 10*3/uL (ref 0.7–4.0)
MCH: 31.3 pg (ref 26.0–34.0)
MCHC: 32.6 g/dL (ref 30.0–36.0)
MCV: 96.1 fL (ref 80.0–100.0)
Monocytes Absolute: 0.5 10*3/uL (ref 0.1–1.0)
Monocytes Relative: 8 %
Neutro Abs: 4.2 10*3/uL (ref 1.7–7.7)
Neutrophils Relative %: 71 %
Platelet Count: 172 10*3/uL (ref 150–400)
RBC: 3.1 MIL/uL — ABNORMAL LOW (ref 4.22–5.81)
RDW: 16.3 % — ABNORMAL HIGH (ref 11.5–15.5)
WBC Count: 6 10*3/uL (ref 4.0–10.5)
nRBC: 0.3 % — ABNORMAL HIGH (ref 0.0–0.2)

## 2023-12-08 LAB — CMP (CANCER CENTER ONLY)
ALT: 10 U/L (ref 0–44)
AST: 12 U/L — ABNORMAL LOW (ref 15–41)
Albumin: 3.6 g/dL (ref 3.5–5.0)
Alkaline Phosphatase: 140 U/L — ABNORMAL HIGH (ref 38–126)
Anion gap: 6 (ref 5–15)
BUN: 15 mg/dL (ref 8–23)
CO2: 27 mmol/L (ref 22–32)
Calcium: 8 mg/dL — ABNORMAL LOW (ref 8.9–10.3)
Chloride: 108 mmol/L (ref 98–111)
Creatinine: 0.91 mg/dL (ref 0.61–1.24)
GFR, Estimated: >60 mL/min (ref >60)
Glucose, Bld: 111 mg/dL — ABNORMAL HIGH (ref 70–99)
Potassium: 3.6 mmol/L (ref 3.5–5.1)
Sodium: 141 mmol/L (ref 135–145)
Total Bilirubin: 1 mg/dL (ref 0.0–1.2)
Total Protein: 5.7 g/dL — ABNORMAL LOW (ref 6.5–8.1)

## 2023-12-08 MED ORDER — SODIUM CHLORIDE 0.9 % IV SOLN: INTRAVENOUS | Status: DC

## 2023-12-08 MED ORDER — DEXAMETHASONE SODIUM PHOSPHATE 10 MG/ML IJ SOLN
10.0000 mg | Freq: Once | INTRAMUSCULAR | Status: AC
  Administered 2023-12-08: 10 mg via INTRAVENOUS
  Filled 2023-12-08: qty 1

## 2023-12-08 MED ORDER — DENOSUMAB 120 MG/1.7ML ~~LOC~~ SOLN
120.0000 mg | Freq: Once | SUBCUTANEOUS | Status: AC
  Administered 2023-12-08: 120 mg via SUBCUTANEOUS
  Filled 2023-12-08: qty 1.7

## 2023-12-08 MED ORDER — SODIUM CHLORIDE 0.9 % IV SOLN
75.0000 mg/m2 | Freq: Once | INTRAVENOUS | Status: AC
  Administered 2023-12-08: 179 mg via INTRAVENOUS
  Filled 2023-12-08: qty 17.9

## 2023-12-08 NOTE — Patient Instructions (Signed)
 [facility]  Discharge Instructions: Thank you for choosing [facility] to provide your oncology and hematology care.   If you have a lab appointment with the Cancer Center, please go directly to the Cancer Center and check in at the registration area.   Wear comfortable clothing and clothing appropriate for easy access to any Portacath or PICC line.   We strive to give you quality time with your provider. You may need to reschedule your appointment if you arrive late (15 or more minutes).  Arriving late affects you and other patients whose appointments are after yours.  Also, if you miss three or more appointments without notifying the office, you may be dismissed from the clinic at the provider's discretion.      For prescription refill requests, have your pharmacy contact our office and allow 72 hours for refills to be completed.    Today you received the following chemotherapy and/or immunotherapy agents: Docetaxel      To help prevent nausea and vomiting after your treatment, we encourage you to take your nausea medication as directed.  BELOW ARE SYMPTOMS THAT SHOULD BE REPORTED IMMEDIATELY: *FEVER GREATER THAN 100.4 F (38 C) OR HIGHER *CHILLS OR SWEATING *NAUSEA AND VOMITING THAT IS NOT CONTROLLED WITH YOUR NAUSEA MEDICATION *UNUSUAL SHORTNESS OF BREATH *UNUSUAL BRUISING OR BLEEDING *URINARY PROBLEMS (pain or burning when urinating, or frequent urination) *BOWEL PROBLEMS (unusual diarrhea, constipation, pain near the anus) TENDERNESS IN MOUTH AND THROAT WITH OR WITHOUT PRESENCE OF ULCERS (sore throat, sores in mouth, or a toothache) UNUSUAL RASH, SWELLING OR PAIN  UNUSUAL VAGINAL DISCHARGE OR ITCHING   Items with * indicate a potential emergency and should be followed up as soon as possible or go to the Emergency Department if any problems should occur.  Please show the CHEMOTHERAPY ALERT CARD or IMMUNOTHERAPY  ALERT CARD at check-in to the Emergency Department and triage nurse.  Should you have questions after your visit or need to cancel or reschedule your appointment, please contact [facility] - A DE[facility] OF [facility]  Dept: [phone]  and follow the prompts.  Office hours are 8:00 a.m. to 4:30 p.m. Monday - Friday. Please note that voicemails left after 4:00 p.m. may not be returned until the following business day.  We are closed weekends and major holidays. You have access to a nurse at all times for urgent questions. Please call the main number to the clinic Dept: [phone] and follow the prompts.   For any non-urgent questions, you may also contact your provider using MyChart. We now offer e-Visits for anyone 58 and older to request care online for non-urgent symptoms. For details visit [email or web address].   Also download the MyChart app! Go to the app store, search "MyChart", open the app, select , and log in with your MyChart username and password.`

## 2023-12-08 NOTE — Progress Notes (Unsigned)
 [facility] [phone]   [phone]  PROGRESS NOTE  Patient Care Team: Lorelei Pont, DO as PCP - General (Family Medicine)  Hematological/Oncological History # Metastatic Castrate Sensitive Prostate Cancer  09/30/2021: percutaneous nephrostomy tube placement and retroperitoneal lymph node biopsy  10/01/2021: Erik Page 240 mg  09/2021: radiation therapy to the lumbar spine after receiving 30 Gray in 10 fractions  11/01/2021: started Eligard 30 mg every 4 months and Zytiga 1000 mg daily with prednisone 5 mg daily  09/25/2022: last visit with Dr. Clelia Croft 10/29/2022: transition care to Dr. Leonides Schanz. Insurance no longer assisting with Zytiga 1000 mg daily, converting enzalutamide 160 mg PO daily.   08/22/2023: PET scan shows progression 10/28/2023: cycle 1, day 1  of docetaxel  Interval History:  Erik Page [age] y.o. male with medical history significant for [facility] presents for a follow up visit. The patient's last visit was on 11/18/2023. He presents today to start cycle 3, day 1 of docetaxel.   On exam today Mr. [name] reports he has not been doing well with hydration and did have an episode where he passed out at church.  He had low blood pressure at that time.  Reports he did not go to the hospital after that incident.  He reports that he did take some ibuprofen this morning because he is having some pain in his leg.  He notes that he does need a refill on his oxycodone at this time.  He is having leg discomfort but no swelling or redness/pain.  He is agreeable to getting an ultrasound.  He reports his appetite and weight are stable.  He notes he is doing his best to try to drink plenty of fluids.  His family did bring him some Gatorade to try to help with this.  He is not having any nausea or vomiting but did have diarrhea which is now turned into constipation.. Overall he feels well and is willing and able to proceed with therapy at this time.  A full 10 point ROS was  otherwise negative.  MEDICAL HISTORY:  Past Medical History:  Diagnosis Date   HTN (hypertension)    Myocardial infarction [facility])     SURGICAL HISTORY: Past Surgical History:  Procedure Laterality Date   CARDIAC CATHETERIZATION N/A 08/15/2015   Procedure: Left Heart Cath and Coronary Angiography;  Surgeon: Marykay Lex, MD;  Location: [facility] INVASIVE CV LAB;  Service: Cardiovascular;  Laterality: N/A;   CARDIAC CATHETERIZATION N/A 08/15/2015   Procedure: Left Heart Cath and Coronary Angiography;  Surgeon: Marykay Lex, MD;  Location: [facility] INVASIVE CV LAB;  Service: Cardiovascular;  Laterality: N/A;   CARDIAC CATHETERIZATION N/A 08/15/2015   Procedure: Coronary Stent Intervention;  Surgeon: Marykay Lex, MD;  Location: [facility] INVASIVE CV LAB;  Service: Cardiovascular;  Laterality: N/A;   IR CONVERT RIGHT NEPHROSTOMY TO NEPHROURETERAL CATH  11/12/2021   IR EXT NEPHROURETERAL CATH EXCHANGE  01/07/2022   IR NEPHROSTOMY EXCHANGE RIGHT  02/20/2022   IR NEPHROSTOMY PLACEMENT RIGHT  10/01/2021   TONSILLECTOMY      SOCIAL HISTORY: Social History   Socioeconomic History   Marital status: Married    Spouse name: Not on file   Number of children: Not on file   Years of education: Not on file   Highest education level: Not on file  Occupational History   Not on file  Tobacco Use   Smoking status: Never   Smokeless tobacco: Never  Vaping Use   Vaping status: Never Used  Substance and Sexual Activity   Alcohol use: No    Alcohol/week: 0.0 standard drinks of alcohol   Drug use: No   Sexual activity: Not on file  Other Topics Concern   Not on file  Social History Narrative   Works as a Pensions consultant; 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  MEDICATIONS:  Current Outpatient Medications  Medication Sig Dispense Refill   aspirin EC 81 MG EC tablet Take 1 tablet (81 mg total) by mouth daily.     atorvastatin (LIPITOR) 80 MG tablet TAKE 1 TABLET BY MOUTH EVERY DAY 90 tablet 1   calcium-vitamin D (OSCAL WITH D) 500-5 MG-MCG tablet Take 1 tablet by mouth 2 (two) times daily. 90 tablet 3   feeding supplement (ENSURE ENLIVE / ENSURE PLUS) LIQD Take 237 mLs by mouth 2 (two) times daily between meals. 237 mL 12   ibuprofen (ADVIL) 800 MG tablet Take 1 tablet (800 mg total) by mouth 3 (three) times daily. 90 tablet 1   metoprolol tartrate (LOPRESSOR) 25 MG tablet Take 1 tablet (25 mg total) by mouth 2 (two) times daily. 180 tablet 1   nitroGLYCERIN (NITROSTAT) 0.4 MG SL tablet DISSOLVE 1 TAB UNDER TONGUE FOR CHEST PAIN - IF PAIN REMAINS AFTER 5 MIN, CALL 911 AND REPEAT DOSE. MAX 3 TABS IN 15 MINUTES (Patient taking differently: 0.4 mg every 5 (five) minutes as needed for chest pain.) 25 tablet 3   ondansetron (ZOFRAN) 8 MG tablet Take 1 tablet (8 mg total) by mouth every 8 (eight) hours as needed. 30 tablet 0   oxyCODONE (OXY IR/ROXICODONE) 5 MG immediate release tablet Take 2 tablets (10 mg total) by mouth every 4 (four) hours as needed for severe pain (pain score 7-10). 120 tablet 0   prochlorperazine (COMPAZINE) 10 MG tablet Take 1 tablet (10 mg total) by mouth every 6 (six) hours as needed for nausea or vomiting. 30 tablet 0   senna-docusate (SENOKOT-S) 8.6-50 MG tablet Take 1 tablet by mouth at bedtime as needed for mild constipation. 30 tablet 0   No current facility-administered medications for this visit.    REVIEW OF SYSTEMS:   Constitutional: ( - ) fevers, ( - )  chills , ( - ) night sweats Eyes: ( - ) blurriness of vision, ( - ) double vision, ( - ) watery eyes Ears, nose, mouth, throat, and face:  ( - ) mucositis, ( - ) sore throat Respiratory: ( - ) cough, ( - ) dyspnea, ( - ) wheezes Cardiovascular: ( - ) palpitation, ( - ) chest discomfort, ( - ) lower extremity swelling Gastrointestinal:  ( - ) nausea, ( - ) heartburn, ( - ) change in bowel habits Skin: ( - ) abnormal skin rashes Lymphatics: ( - ) new lymphadenopathy, ( - ) easy bruising Neurological: ( - ) numbness, ( - ) tingling, ( - ) new weaknesses Behavioral/Psych: ( - ) mood change, ( - ) new changes  All other systems were reviewed with the patient and are negative.  PHYSICAL EXAMINATION:  ECOG PERFORMANCE STATUS: 0 - Asymptomatic  Vitals:   12/08/23 0909  BP: 124/73  Pulse: 84  Resp: 15  Temp: (!) 97.3 F (36.3 C)  SpO2: 100%      Filed Weights   12/08/23 0909  Weight: 240 lb 4.8 oz (109 kg)      GENERAL: Well-appearing elderly African-American male, alert, no distress and comfortable SKIN: skin color, texture, turgor are normal, no rashes or significant lesions EYES: conjunctiva are pink and non-injected, sclera clear LUNGS: clear to auscultation and percussion with normal breathing effort HEART: regular rate & rhythm and no murmurs and no lower extremity edema Musculoskeletal: no cyanosis of digits and no clubbing  PSYCH: alert & oriented x 3, fluent speech NEURO: no focal motor/sensory deficits  LABORATORY DATA:  I have reviewed the data as listed    Latest Ref Rng & Units 12/08/2023    8:39 AM 11/18/2023    9:32 AM 10/28/2023    8:28 AM  CBC  WBC 4.0 - 10.5 K/uL 6.0  5.9  3.5   Hemoglobin 13.0 - 17.0 g/dL 9.7  16.1  09.6   Hematocrit 39.0 - 52.0 % 29.8  34.9  36.6   Platelets 150 - 400 K/uL 172  231  164        Latest Ref Rng & Units 12/08/2023    8:39 AM 11/18/2023    9:32 AM 10/28/2023    8:28 AM  CMP  Glucose 70 - 99 mg/dL 045  409  811   BUN 8 - 23 mg/dL 15  16  18    Creatinine 0.61 - 1.24 mg/dL 9.14  7.82  9.56   Sodium 135 - 145 mmol/L 141  142  139   Potassium 3.5 - 5.1 mmol/L 3.6   4.2  3.4   Chloride 98 - 111 mmol/L 108  107  102   CO2 22 - 32 mmol/L 27  30  30    Calcium 8.9 - 10.3 mg/dL 8.0  8.7  8.7   Total Protein 6.5 - 8.1 g/dL 5.7  6.2  6.3   Total Bilirubin 0.0 - 1.2 mg/dL 1.0  1.2  1.5   Alkaline Phos 38 - 126 U/L 140  162  197   AST 15 - 41 U/L 12  15  15    ALT 0 - 44 U/L 10  17  19      RADIOGRAPHIC STUDIES: VAS [place] LOWER EXTREMITY VENOUS (DVT) Result Date: 12/11/2023  Lower Venous DVT Study Patient Name:  BREYDON Delfavero  Date of Exam:   12/10/2023 Medical Rec #: [id number]    Accession #:    [id number] Date of Birth: Jul 13, 1951    Patient Gender: M Patient Age:   73 years Exam Location:  [facility] Procedure:      VAS [place] LOWER EXTREMITY VENOUS (DVT) Referring Phys: Jeanie Sewer --------------------------------------------------------------------------------  Indications: Swelling.  Risk Factors: Cancer. Comparison Study: No prior studies. Performing Technologist: Chanda Busing RVT  Examination Guidelines: A complete evaluation includes B-mode imaging, spectral Doppler, color Doppler, and power Doppler as needed of all accessible portions of each vessel. Bilateral testing is considered an integral part of a complete examination. Limited examinations for reoccurring indications may be performed as noted. The reflux portion of the exam is performed with the patient in reverse Trendelenburg.  +---------+---------------+---------+-----------+----------+--------------+ RIGHT    CompressibilityPhasicitySpontaneityPropertiesThrombus Aging +---------+---------------+---------+-----------+----------+--------------+ CFV      Full           Yes  Yes                                 +---------+---------------+---------+-----------+----------+--------------+ SFJ      Full                                                        +---------+---------------+---------+-----------+----------+--------------+ FV Prox  Full                                                         +---------+---------------+---------+-----------+----------+--------------+ FV Mid   Full                                                        +---------+---------------+---------+-----------+----------+--------------+ FV DistalFull                                                        +---------+---------------+---------+-----------+----------+--------------+ PFV      Full                                                        +---------+---------------+---------+-----------+----------+--------------+ POP      Full           Yes      Yes                                 +---------+---------------+---------+-----------+----------+--------------+ PTV      Full                                                        +---------+---------------+---------+-----------+----------+--------------+ PERO     Full                                                        +---------+---------------+---------+-----------+----------+--------------+   +---------+---------------+---------+-----------+----------+--------------+ LEFT     CompressibilityPhasicitySpontaneityPropertiesThrombus Aging +---------+---------------+---------+-----------+----------+--------------+ CFV      Full           Yes      Yes                                 +---------+---------------+---------+-----------+----------+--------------+ SFJ      Full                                                        +---------+---------------+---------+-----------+----------+--------------+  FV Prox  Full                                                        +---------+---------------+---------+-----------+----------+--------------+ FV Mid   Full                                                        +---------+---------------+---------+-----------+----------+--------------+ FV DistalFull                                                         +---------+---------------+---------+-----------+----------+--------------+ PFV      Full                                                        +---------+---------------+---------+-----------+----------+--------------+ POP      Full           Yes      Yes                                 +---------+---------------+---------+-----------+----------+--------------+ PTV      Full                                                        +---------+---------------+---------+-----------+----------+--------------+ PERO     Full                                                        +---------+---------------+---------+-----------+----------+--------------+     Summary: RIGHT: - There is no evidence of deep vein thrombosis in the lower extremity.  - No cystic structure found in the popliteal fossa.  LEFT: - There is no evidence of deep vein thrombosis in the lower extremity.  - No cystic structure found in the popliteal fossa.  *See table(s) above for measurements and observations. Electronically signed by Coral Else MD on 12/11/2023 at 7:40:02 AM.    Final      ASSESSMENT & PLAN Collan Schoenfeld is a 73 y.o. male with medical history significant for [facility] presents for a follow up visit.   # Metastatic Castrate Sensitive Prostate Cancer  --continue lupron 30mg  subq q 4 months indefinitely  --started Eligard 30 mg every 4 months and Zytiga 1000 mg daily with prednisone 5 mg daily on 11/01/2021:   --transition from Zytiga to enzalutamide 160mg  PO daily due to cost. Was able to get approval to return to Zytiga due to faillure of enzalutamide.  --PET scan from 08/22/2023 showed progression  with multiple new and progressive bone lesions.  --Recommend to switch to docetaxel q 3 weeks.  PLAN: --Due for cycle 3, day 1 of docetaxel today --Labs today show white blood cell count 6.0, hemoglobin 9.7, MCV 96.1, platelets 172, creatinine and LFTs are normal.  -- Last PSA 468 on 11/18/2023  --Will  administer Xgeva q 2 months, last one was 10/08/2023 so next one will be due around March 2025 (due today) .  --Will administer Lupron every 4 months, last one was on 08/06/2023 so next one will be due around March 2025 (due today)  -- RTC in 3 weeks with labs and follow up before Cycle 4, Day 1.  #Hypokalemia: --Potassium level is 3.6 today --Advised to incorporate potassium rich foods into his diet.    No orders of the defined types were placed in this encounter.   All questions were answered. The patient knows to call the clinic with any problems, questions or concerns.  A total of more than 30 minutes were spent on this encounter with face-to-face time and non-face-to-face time, including preparing to see the patient, ordering tests and/or medications, counseling the patient and coordination of care as outlined above.   Ulysees Barns, MD Department of Hematology/Oncology [facility] Phone: [phone] Pager: [phone] Email: [email or web address]    12/11/2023 9:54 AM

## 2023-12-09 LAB — PROSTATE-SPECIFIC AG, SERUM (LABCORP): Prostate Specific Ag, Serum: 567 ng/mL — ABNORMAL HIGH (ref 0.0–4.0)

## 2023-12-10 ENCOUNTER — Ambulatory Visit (HOSPITAL_BASED_OUTPATIENT_CLINIC_OR_DEPARTMENT_OTHER)
Admission: RE | Admit: 2023-12-10 | Discharge: 2023-12-10 | Disposition: A | Discharge status: Home / Self Care | Source: Ambulatory Visit | Attending: Hematology and Oncology | Admitting: Hematology and Oncology

## 2023-12-10 ENCOUNTER — Inpatient Hospital Stay: Payer: Medicare Other

## 2023-12-10 VITALS — BP 112/68 | HR 86 | Temp 99.1°F | Resp 17

## 2023-12-10 DIAGNOSIS — C61 Malignant neoplasm of prostate: Secondary | ICD-10-CM | POA: Insufficient documentation

## 2023-12-10 DIAGNOSIS — Z5111 Encounter for antineoplastic chemotherapy: Secondary | ICD-10-CM | POA: Diagnosis not present

## 2023-12-10 MED ORDER — PEGFILGRASTIM-FPGK 6 MG/0.6ML ~~LOC~~ SOSY
6.0000 mg | PREFILLED_SYRINGE | Freq: Once | SUBCUTANEOUS | Status: AC
  Administered 2023-12-10: 6 mg via SUBCUTANEOUS
  Filled 2023-12-10: qty 0.6

## 2023-12-10 MED ORDER — LEUPROLIDE ACETATE (4 MONTH) 30 MG ~~LOC~~ KIT
30.0000 mg | PACK | Freq: Once | SUBCUTANEOUS | Status: AC
  Administered 2023-12-10: 30 mg via SUBCUTANEOUS
  Filled 2023-12-10: qty 30

## 2023-12-10 NOTE — Progress Notes (Signed)
 Bilateral lower extremity venous duplex has been completed. Preliminary results can be found in CV Proc through chart review.  Results were given to Dr. Leonides Schanz.  12/10/23 2:01 PM Olen Cordial RVT

## 2023-12-11 ENCOUNTER — Encounter: Payer: Self-pay | Admitting: Hematology and Oncology

## 2023-12-11 ENCOUNTER — Telehealth: Payer: Self-pay | Admitting: *Deleted

## 2023-12-11 MED ORDER — OXYCODONE HCL 5 MG PO TABS: 10.0000 mg | ORAL_TABLET | ORAL | 0 refills | Status: AC | PRN

## 2023-12-11 NOTE — Telephone Encounter (Signed)
 TCT patient regarding recent lab results. Spoke with him. Advised that the ultrasound of his lower extremities was negative. There was no evidence of DVT. His PSA does continue to rise, though he is only on the third cycle of chemotherapy. Dr. Leonides Schanz is hopeful that by the next cycle these levels will start to decline. Additionally the oxycodone has been called into his pharmacy as requested. Pt has picked up the oxycodone and is trying it at night to see if it helps the leg pain he experiences at night. He took it last night and it did ease the pain some. He voiced understanding to the above information.

## 2023-12-11 NOTE — Telephone Encounter (Signed)
-----   Message from Ulysees Barns IV sent at 12/11/2023  9:54 AM EDT ----- Please let [name] know that the ultrasound of his lower extremities was negative.  There was no evidence of DVT.  His PSA does continue to rise, though he is only on the third cycle of chemotherapy.  I would hope that by the next cycle these levels will start to decline.  Additionally the oxycodone has been called into his pharmacy as requested. ----- Message ----- From: Raymondo Band Sent: 12/11/2023   9:46 AM EDT To: Jaci Standard, MD  His PSA level is continuing to rise. ----- Message ----- From: Leory Plowman, Lab In [place] Sent: 12/08/2023   8:57 AM EDT To: Briant Cedar, PA-C

## 2023-12-29 ENCOUNTER — Inpatient Hospital Stay: Payer: Medicare Other | Attending: Hematology and Oncology

## 2023-12-29 ENCOUNTER — Inpatient Hospital Stay (HOSPITAL_BASED_OUTPATIENT_CLINIC_OR_DEPARTMENT_OTHER): Payer: Medicare Other | Attending: Hematology and Oncology | Admitting: Hematology and Oncology

## 2023-12-29 VITALS — BP 144/78 | HR 86 | Temp 97.6°F | Resp 15 | Wt 244.5 lb

## 2023-12-29 DIAGNOSIS — Z5189 Encounter for other specified aftercare: Secondary | ICD-10-CM | POA: Insufficient documentation

## 2023-12-29 DIAGNOSIS — Z5111 Encounter for antineoplastic chemotherapy: Secondary | ICD-10-CM | POA: Insufficient documentation

## 2023-12-29 DIAGNOSIS — Z7952 Long term (current) use of systemic steroids: Secondary | ICD-10-CM | POA: Diagnosis not present

## 2023-12-29 DIAGNOSIS — E876 Hypokalemia: Secondary | ICD-10-CM | POA: Diagnosis not present

## 2023-12-29 DIAGNOSIS — C7951 Secondary malignant neoplasm of bone: Secondary | ICD-10-CM | POA: Diagnosis present

## 2023-12-29 DIAGNOSIS — C61 Malignant neoplasm of prostate: Secondary | ICD-10-CM

## 2023-12-29 LAB — CBC WITH DIFFERENTIAL (CANCER CENTER ONLY)
Abs Immature Granulocytes: 0.09 10*3/uL — ABNORMAL HIGH (ref 0.00–0.07)
Basophils Absolute: 0 10*3/uL (ref 0.0–0.1)
Basophils Relative: 1 %
Eosinophils Absolute: 0 10*3/uL (ref 0.0–0.5)
Eosinophils Relative: 1 %
HCT: 27.4 % — ABNORMAL LOW (ref 39.0–52.0)
Hemoglobin: 9.1 g/dL — ABNORMAL LOW (ref 13.0–17.0)
Immature Granulocytes: 2 %
Lymphocytes Relative: 18 %
Lymphs Abs: 0.9 10*3/uL (ref 0.7–4.0)
MCH: 32.9 pg (ref 26.0–34.0)
MCHC: 33.2 g/dL (ref 30.0–36.0)
MCV: 98.9 fL (ref 80.0–100.0)
Monocytes Absolute: 0.4 10*3/uL (ref 0.1–1.0)
Monocytes Relative: 9 %
Neutro Abs: 3.4 10*3/uL (ref 1.7–7.7)
Neutrophils Relative %: 69 %
Platelet Count: 173 10*3/uL (ref 150–400)
RBC: 2.77 MIL/uL — ABNORMAL LOW (ref 4.22–5.81)
RDW: 19.2 % — ABNORMAL HIGH (ref 11.5–15.5)
WBC Count: 4.9 10*3/uL (ref 4.0–10.5)
nRBC: 0 % (ref 0.0–0.2)

## 2023-12-29 LAB — CMP (CANCER CENTER ONLY)
ALT: 13 U/L (ref 0–44)
AST: 13 U/L — ABNORMAL LOW (ref 15–41)
Albumin: 3.5 g/dL (ref 3.5–5.0)
Alkaline Phosphatase: 114 U/L (ref 38–126)
Anion gap: 4 — ABNORMAL LOW (ref 5–15)
BUN: 16 mg/dL (ref 8–23)
CO2: 28 mmol/L (ref 22–32)
Calcium: 8.6 mg/dL — ABNORMAL LOW (ref 8.9–10.3)
Chloride: 109 mmol/L (ref 98–111)
Creatinine: 0.86 mg/dL (ref 0.61–1.24)
GFR, Estimated: >60 mL/min (ref >60)
Glucose, Bld: 116 mg/dL — ABNORMAL HIGH (ref 70–99)
Potassium: 4.2 mmol/L (ref 3.5–5.1)
Sodium: 141 mmol/L (ref 135–145)
Total Bilirubin: 1.2 mg/dL (ref 0.0–1.2)
Total Protein: 5.4 g/dL — ABNORMAL LOW (ref 6.5–8.1)

## 2023-12-29 MED ORDER — DOCETAXEL CHEMO INJECTION 160 MG/16ML
75.0000 mg/m2 | Freq: Once | INTRAVENOUS | Status: AC
  Administered 2023-12-29: 179 mg via INTRAVENOUS
  Filled 2023-12-29: qty 17.9

## 2023-12-29 MED ORDER — SODIUM CHLORIDE 0.9 % IV SOLN: INTRAVENOUS | Status: DC

## 2023-12-29 MED ORDER — DEXAMETHASONE SODIUM PHOSPHATE 10 MG/ML IJ SOLN
10.0000 mg | Freq: Once | INTRAMUSCULAR | Status: AC
  Administered 2023-12-29: 10 mg via INTRAVENOUS
  Filled 2023-12-29: qty 1

## 2023-12-29 NOTE — Progress Notes (Unsigned)
 [facility] [phone]   [phone]  PROGRESS NOTE  Patient Care Team: Lorelei Pont, DO as PCP - General (Family Medicine)  Hematological/Oncological History # Metastatic Castrate Sensitive Prostate Cancer  09/30/2021: percutaneous nephrostomy tube placement and retroperitoneal lymph node biopsy  10/01/2021: Deborra Medina 240 mg  09/2021: radiation therapy to the lumbar spine after receiving 30 Gray in 10 fractions  11/01/2021: started Eligard 30 mg every 4 months and Zytiga 1000 mg daily with prednisone 5 mg daily  09/25/2022: last visit with Dr. Clelia Croft 10/29/2022: transition care to Dr. Leonides Schanz. Insurance no longer assisting with Zytiga 1000 mg daily, converting enzalutamide 160 mg PO daily.   08/22/2023: PET scan shows progression 10/28/2023: cycle 1, day 1  of docetaxel  Interval History:  Clemens [age] y.o. male with medical history significant for [facility] presents for a follow up visit. The patient's last visit was on 12/08/2023. He presents today to start cycle 4, day 1 of docetaxel.   On exam today Mr. [name] reports he has been feeling weak with reduced stamina.  He is not having any lightheadedness, dizziness, shortness of breath.  He reports his energy is about a 5 out of 10.  He reports that he recently had to "crawl up the stairs".  He notes that his appetite remains good though food is tasting poor.  He reports he has gained 4 pounds of weight.  He notes that he is not having any nausea, vomiting, or diarrhea and is taking a stool softener daily.  He reports that he is tolerating the chemotherapy without any major side effects.  He notes he is not currently having any pain anywhere. Overall he feels well and is willing and able to proceed with therapy at this time.  A full 10 point ROS was otherwise negative.  MEDICAL HISTORY:  Past Medical History:  Diagnosis Date   HTN (hypertension)    Myocardial infarction [facility])     SURGICAL HISTORY: Past Surgical  History:  Procedure Laterality Date   CARDIAC CATHETERIZATION N/A 08/15/2015   Procedure: Left Heart Cath and Coronary Angiography;  Surgeon: Marykay Lex, MD;  Location: [facility] INVASIVE CV LAB;  Service: Cardiovascular;  Laterality: N/A;   CARDIAC CATHETERIZATION N/A 08/15/2015   Procedure: Left Heart Cath and Coronary Angiography;  Surgeon: Marykay Lex, MD;  Location: [facility] INVASIVE CV LAB;  Service: Cardiovascular;  Laterality: N/A;   CARDIAC CATHETERIZATION N/A 08/15/2015   Procedure: Coronary Stent Intervention;  Surgeon: Marykay Lex, MD;  Location: [facility] INVASIVE CV LAB;  Service: Cardiovascular;  Laterality: N/A;   IR CONVERT RIGHT NEPHROSTOMY TO NEPHROURETERAL CATH  11/12/2021   IR EXT NEPHROURETERAL CATH EXCHANGE  01/07/2022   IR NEPHROSTOMY EXCHANGE RIGHT  02/20/2022   IR NEPHROSTOMY PLACEMENT RIGHT  10/01/2021   TONSILLECTOMY      SOCIAL HISTORY: Social History   Socioeconomic History   Marital status: Married    Spouse name: Not on file   Number of children: Not on file   Years of education: Not on file   Highest education level: Not on file  Occupational History   Not on file  Tobacco Use   Smoking status: Never   Smokeless tobacco: Never  Vaping Use   Vaping status: Never Used  Substance and Sexual Activity   Alcohol use: No    Alcohol/week: 0.0 standard drinks of alcohol   Drug use: No   Sexual activity: Not on file  Other Topics Concern   Not on file  Social History Narrative   Works as a Pensions consultant; 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  MEDICATIONS:  Current Outpatient  Medications  Medication Sig Dispense Refill   aspirin EC 81 MG EC tablet Take 1 tablet (81 mg total) by mouth daily.     atorvastatin (LIPITOR) 80 MG tablet TAKE 1 TABLET BY MOUTH EVERY DAY 90 tablet 1   calcium-vitamin D (OSCAL WITH D) 500-5 MG-MCG tablet Take 1 tablet by mouth 2 (two) times daily. 90 tablet 3   feeding supplement (ENSURE ENLIVE / ENSURE PLUS) LIQD Take 237 mLs by mouth 2 (two) times daily between meals. 237 mL 12   ibuprofen (ADVIL) 800 MG tablet Take 1 tablet (800 mg total) by mouth 3 (three) times daily. 90 tablet 1   metoprolol tartrate (LOPRESSOR) 25 MG tablet Take 1 tablet (25 mg total) by mouth 2 (two) times daily. 180 tablet 1   nitroGLYCERIN (NITROSTAT) 0.4 MG SL tablet DISSOLVE 1 TAB UNDER TONGUE FOR CHEST PAIN - IF PAIN REMAINS AFTER 5 MIN, CALL 911 AND REPEAT DOSE. MAX 3 TABS IN 15 MINUTES (Patient taking differently: 0.4 mg every 5 (five) minutes as needed for chest pain.) 25 tablet 3   ondansetron (ZOFRAN) 8 MG tablet Take 1 tablet (8 mg total) by mouth every 8 (eight) hours as needed. 30 tablet 0   oxyCODONE (OXY IR/ROXICODONE) 5 MG immediate release tablet Take 2 tablets (10 mg total) by mouth every 4 (four) hours as needed for severe pain (pain score 7-10). 120 tablet 0   prochlorperazine (COMPAZINE) 10 MG tablet Take 1 tablet (10 mg total) by mouth every 6 (six) hours as needed for nausea or vomiting. 30 tablet 0   senna-docusate (SENOKOT-S) 8.6-50 MG tablet Take 1 tablet by mouth at bedtime as needed for mild constipation. 30 tablet 0   No current facility-administered medications for this visit.    REVIEW OF SYSTEMS:   Constitutional: ( - ) fevers, ( - )  chills , ( - ) night sweats Eyes: ( - ) blurriness of vision, ( - ) double vision, ( - ) watery eyes Ears, nose, mouth, throat, and face: ( - ) mucositis, ( - ) sore throat Respiratory: ( - ) cough, ( - ) dyspnea, ( - ) wheezes Cardiovascular: ( - ) palpitation, ( - ) chest discomfort, ( - ) lower  extremity swelling Gastrointestinal:  ( - ) nausea, ( - ) heartburn, ( - ) change in bowel habits Skin: ( - ) abnormal skin rashes Lymphatics: ( - ) new lymphadenopathy, ( - ) easy bruising Neurological: ( - ) numbness, ( - ) tingling, ( - ) new weaknesses Behavioral/Psych: ( - ) mood change, ( - ) new changes  All other systems were reviewed with the patient and are negative.  PHYSICAL EXAMINATION: ECOG PERFORMANCE STATUS: 0 - Asymptomatic  Vitals:   12/29/23 1417  BP: (!) 144/78  Pulse: 86  Resp: 15  Temp: 97.6 F (36.4 C)  SpO2: 100%      Filed Weights  12/29/23 1417  Weight: 244 lb 8 oz (110.9 kg)      GENERAL: Well-appearing elderly African-American male, alert, no distress and comfortable SKIN: skin color, texture, turgor are normal, no rashes or significant lesions EYES: conjunctiva are pink and non-injected, sclera clear LUNGS: clear to auscultation and percussion with normal breathing effort HEART: regular rate & rhythm and no murmurs and no lower extremity edema Musculoskeletal: no cyanosis of digits and no clubbing  PSYCH: alert & oriented x 3, fluent speech NEURO: no focal motor/sensory deficits  LABORATORY DATA:  I have reviewed the data as listed    Latest Ref Rng & Units 12/29/2023    1:40 PM 12/08/2023    8:39 AM 11/18/2023    9:32 AM  CBC  WBC 4.0 - 10.5 K/uL 4.9  6.0  5.9   Hemoglobin 13.0 - 17.0 g/dL 9.1  9.7  16.1   Hematocrit 39.0 - 52.0 % 27.4  29.8  34.9   Platelets 150 - 400 K/uL 173  172  231        Latest Ref Rng & Units 12/29/2023    1:40 PM 12/08/2023    8:39 AM 11/18/2023    9:32 AM  CMP  Glucose 70 - 99 mg/dL 096  045  409   BUN 8 - 23 mg/dL 16  15  16    Creatinine 0.61 - 1.24 mg/dL 8.11  9.14  7.82   Sodium 135 - 145 mmol/L 141  141  142   Potassium 3.5 - 5.1 mmol/L 4.2  3.6  4.2   Chloride 98 - 111 mmol/L 109  108  107   CO2 22 - 32 mmol/L 28  27  30    Calcium 8.9 - 10.3 mg/dL 8.6  8.0  8.7   Total Protein 6.5 - 8.1 g/dL 5.4   5.7  6.2   Total Bilirubin 0.0 - 1.2 mg/dL 1.2  1.0  1.2   Alkaline Phos 38 - 126 U/L 114  140  162   AST 15 - 41 U/L 13  12  15    ALT 0 - 44 U/L 13  10  17      RADIOGRAPHIC STUDIES: VAS [place] LOWER EXTREMITY VENOUS (DVT) Result Date: 12/11/2023  Lower Venous DVT Study Patient Name:  FOXX Dejager  Date of Exam:   12/10/2023 Medical Rec #: [id number]    Accession #:    [id number] Date of Birth: Sep 01, 1951    Patient Gender: M Patient Age:   73 years Exam Location:  [facility] Procedure:      VAS [place] LOWER EXTREMITY VENOUS (DVT) Referring Phys: Jeanie Sewer --------------------------------------------------------------------------------  Indications: Swelling.  Risk Factors: Cancer. Comparison Study: No prior studies. Performing Technologist: Chanda Busing RVT  Examination Guidelines: A complete evaluation includes B-mode imaging, spectral Doppler, color Doppler, and power Doppler as needed of all accessible portions of each vessel. Bilateral testing is considered an integral part of a complete examination. Limited examinations for reoccurring indications may be performed as noted. The reflux portion of the exam is performed with the patient in reverse Trendelenburg.  +---------+---------------+---------+-----------+----------+--------------+ RIGHT    CompressibilityPhasicitySpontaneityPropertiesThrombus Aging +---------+---------------+---------+-----------+----------+--------------+ CFV      Full           Yes      Yes                                 +---------+---------------+---------+-----------+----------+--------------+ SFJ  Full                                                        +---------+---------------+---------+-----------+----------+--------------+ FV Prox  Full                                                        +---------+---------------+---------+-----------+----------+--------------+ FV Mid   Full                                                         +---------+---------------+---------+-----------+----------+--------------+ FV DistalFull                                                        +---------+---------------+---------+-----------+----------+--------------+ PFV      Full                                                        +---------+---------------+---------+-----------+----------+--------------+ POP      Full           Yes      Yes                                 +---------+---------------+---------+-----------+----------+--------------+ PTV      Full                                                        +---------+---------------+---------+-----------+----------+--------------+ PERO     Full                                                        +---------+---------------+---------+-----------+----------+--------------+   +---------+---------------+---------+-----------+----------+--------------+ LEFT     CompressibilityPhasicitySpontaneityPropertiesThrombus Aging +---------+---------------+---------+-----------+----------+--------------+ CFV      Full           Yes      Yes                                 +---------+---------------+---------+-----------+----------+--------------+ SFJ      Full                                                        +---------+---------------+---------+-----------+----------+--------------+  FV Prox  Full                                                        +---------+---------------+---------+-----------+----------+--------------+ FV Mid   Full                                                        +---------+---------------+---------+-----------+----------+--------------+ FV DistalFull                                                        +---------+---------------+---------+-----------+----------+--------------+ PFV      Full                                                         +---------+---------------+---------+-----------+----------+--------------+ POP      Full           Yes      Yes                                 +---------+---------------+---------+-----------+----------+--------------+ PTV      Full                                                        +---------+---------------+---------+-----------+----------+--------------+ PERO     Full                                                        +---------+---------------+---------+-----------+----------+--------------+     Summary: RIGHT: - There is no evidence of deep vein thrombosis in the lower extremity.  - No cystic structure found in the popliteal fossa.  LEFT: - There is no evidence of deep vein thrombosis in the lower extremity.  - No cystic structure found in the popliteal fossa.  *See table(s) above for measurements and observations. Electronically signed by Coral Else MD on 12/11/2023 at 7:40:02 AM.    Final      ASSESSMENT & PLAN Pruitt Taboada is a 73 y.o. male with medical history significant for [facility] presents for a follow up visit.   # Metastatic Castrate Sensitive Prostate Cancer  --continue lupron 30mg  subq q 4 months indefinitely  --started Eligard 30 mg every 4 months and Zytiga 1000 mg daily with prednisone 5 mg daily on 11/01/2021:   --transition from Zytiga to enzalutamide 160mg  PO daily due to cost. Was able to get approval to return to Zytiga due to faillure of enzalutamide.  --PET scan from 08/22/2023 showed progression  with multiple new and progressive bone lesions.  --Recommend to switch to docetaxel q 3 weeks.  PLAN: --Due for cycle 4, day 1 of docetaxel today --Labs today show white blood cell count 4.9, hemoglobin 9.1, MCV 98.9, platelets 173, creatinine and LFTs are normal.  -- Last PSA 567.0 on 12/08/2023  --Will administer Xgeva q 2 months, last one was 12/08/2023 so next one will be due around May 2025 (due today) .  --Will administer Lupron every 4 months, last  one was on 12/10/2023 so next one will be due around July 2025  -- RTC in 3 weeks with labs and follow up before Cycle 5, Day 1.  #Hypokalemia: --Potassium level is 4.2 today --Advised to incorporate potassium rich foods into his diet.    No orders of the defined types were placed in this encounter.   All questions were answered. The patient knows to call the clinic with any problems, questions or concerns.  A total of more than 30 minutes were spent on this encounter with face-to-face time and non-face-to-face time, including preparing to see the patient, ordering tests and/or medications, counseling the patient and coordination of care as outlined above.   Ulysees Barns, MD Department of Hematology/Oncology [facility] at [facility] Phone: [phone] Pager: [phone] Email: [email or web address]    12/30/2023 9:32 AM

## 2023-12-29 NOTE — Patient Instructions (Signed)
 [facility] - A DEPT OF [facility]  Discharge Instructions: Thank you for choosing [facility] to provide your oncology and hematology care.   If you have a lab appointment with the Cancer Center, please go directly to the Cancer Center and check in at the registration area.   Wear comfortable clothing and clothing appropriate for easy access to any Portacath or PICC line.   We strive to give you quality time with your provider. You may need to reschedule your appointment if you arrive late (15 or more minutes).  Arriving late affects you and other patients whose appointments are after yours.  Also, if you miss three or more appointments without notifying the office, you may be dismissed from the clinic at the provider's discretion.      For prescription refill requests, have your pharmacy contact our office and allow 72 hours for refills to be completed.    Today you received the following chemotherapy and/or immunotherapy agent: Docetaxel (Taxotere).      To help prevent nausea and vomiting after your treatment, we encourage you to take your nausea medication as directed.  BELOW ARE SYMPTOMS THAT SHOULD BE REPORTED IMMEDIATELY: *FEVER GREATER THAN 100.4 F (38 C) OR HIGHER *CHILLS OR SWEATING *NAUSEA AND VOMITING THAT IS NOT CONTROLLED WITH YOUR NAUSEA MEDICATION *UNUSUAL SHORTNESS OF BREATH *UNUSUAL BRUISING OR BLEEDING *URINARY PROBLEMS (pain or burning when urinating, or frequent urination) *BOWEL PROBLEMS (unusual diarrhea, constipation, pain near the anus) TENDERNESS IN MOUTH AND THROAT WITH OR WITHOUT PRESENCE OF ULCERS (sore throat, sores in mouth, or a toothache) UNUSUAL RASH, SWELLING OR PAIN  UNUSUAL VAGINAL DISCHARGE OR ITCHING   Items with * indicate a potential emergency and should be followed up as soon as possible or go to the Emergency Department if any problems should occur.  Please show the CHEMOTHERAPY ALERT CARD or  IMMUNOTHERAPY ALERT CARD at check-in to the Emergency Department and triage nurse.  Should you have questions after your visit or need to cancel or reschedule your appointment, please contact [facility] - A DEPT OF [facility]  Dept: [phone]  and follow the prompts.  Office hours are 8:00 a.m. to 4:30 p.m. Monday - Friday. Please note that voicemails left after 4:00 p.m. may not be returned until the following business day.  We are closed weekends and major holidays. You have access to a nurse at all times for urgent questions. Please call the main number to the clinic Dept: [phone] and follow the prompts.   For any non-urgent questions, you may also contact your provider using MyChart. We now offer e-Visits for anyone 35 and older to request care online for non-urgent symptoms. For details visit [email or web address].   Also download the MyChart app! Go to the app store, search "MyChart", open the app, select [facility], and log in with your MyChart username and password.

## 2023-12-30 ENCOUNTER — Encounter: Payer: Self-pay | Admitting: Hematology and Oncology

## 2023-12-30 LAB — PROSTATE-SPECIFIC AG, SERUM (LABCORP): Prostate Specific Ag, Serum: 579 ng/mL — ABNORMAL HIGH (ref 0.0–4.0)

## 2023-12-31 ENCOUNTER — Inpatient Hospital Stay: Payer: Medicare Other

## 2023-12-31 VITALS — BP 133/98 | HR 105 | Temp 98.8°F | Resp 16

## 2023-12-31 DIAGNOSIS — C61 Malignant neoplasm of prostate: Secondary | ICD-10-CM

## 2023-12-31 DIAGNOSIS — Z5111 Encounter for antineoplastic chemotherapy: Secondary | ICD-10-CM | POA: Diagnosis not present

## 2023-12-31 MED ORDER — DENOSUMAB 120 MG/1.7ML ~~LOC~~ SOLN: 120.0000 mg | Freq: Once | SUBCUTANEOUS | Status: DC

## 2023-12-31 MED ORDER — PEGFILGRASTIM-FPGK 6 MG/0.6ML ~~LOC~~ SOSY
6.0000 mg | PREFILLED_SYRINGE | Freq: Once | SUBCUTANEOUS | Status: AC
  Administered 2023-12-31: 6 mg via SUBCUTANEOUS
  Filled 2023-12-31: qty 0.6

## 2024-01-05 ENCOUNTER — Telehealth: Payer: Self-pay | Admitting: *Deleted

## 2024-01-05 NOTE — Telephone Encounter (Signed)
 Received call from pt He states he is very fatigued, weak, not eating well. He has been in bed for the last 2 days. He states he cannot do this chemo anymore if his life is going to be like this. He states he is trying to eat and drink but is so weak, he has a hard time getting out of bed.   Dr. Rosaline Coma made aware.

## 2024-01-06 ENCOUNTER — Telehealth: Payer: Self-pay | Admitting: *Deleted

## 2024-01-06 NOTE — Telephone Encounter (Signed)
 Per Dr. Rosaline Coma - he spoke with [name] this morning.  He is due to come in to office on 01/19/2024. No changes in the interim, his chemo is every 3 weeks.He said he advised patient to call office if he could not keep fluids down or felt dehydrated, in case he needed fluids/ IV nausea meds

## 2024-01-19 ENCOUNTER — Inpatient Hospital Stay: Payer: Medicare Other

## 2024-01-19 ENCOUNTER — Inpatient Hospital Stay (HOSPITAL_BASED_OUTPATIENT_CLINIC_OR_DEPARTMENT_OTHER): Payer: Medicare Other | Admitting: Hematology and Oncology

## 2024-01-19 VITALS — BP 103/60 | HR 94 | Temp 97.7°F | Resp 16 | Wt 244.6 lb

## 2024-01-19 DIAGNOSIS — E876 Hypokalemia: Secondary | ICD-10-CM

## 2024-01-19 DIAGNOSIS — C61 Malignant neoplasm of prostate: Secondary | ICD-10-CM

## 2024-01-19 DIAGNOSIS — D649 Anemia, unspecified: Secondary | ICD-10-CM

## 2024-01-19 DIAGNOSIS — Z5111 Encounter for antineoplastic chemotherapy: Secondary | ICD-10-CM | POA: Diagnosis not present

## 2024-01-19 LAB — CBC WITH DIFFERENTIAL (CANCER CENTER ONLY)
Abs Immature Granulocytes: 0.1 10*3/uL — ABNORMAL HIGH (ref 0.00–0.07)
Basophils Absolute: 0 10*3/uL (ref 0.0–0.1)
Basophils Relative: 1 %
Eosinophils Absolute: 0 10*3/uL (ref 0.0–0.5)
Eosinophils Relative: 1 %
HCT: 28.3 % — ABNORMAL LOW (ref 39.0–52.0)
Hemoglobin: 9.1 g/dL — ABNORMAL LOW (ref 13.0–17.0)
Immature Granulocytes: 2 %
Lymphocytes Relative: 19 %
Lymphs Abs: 1 10*3/uL (ref 0.7–4.0)
MCH: 33.1 pg (ref 26.0–34.0)
MCHC: 32.2 g/dL (ref 30.0–36.0)
MCV: 102.9 fL — ABNORMAL HIGH (ref 80.0–100.0)
Monocytes Absolute: 0.4 10*3/uL (ref 0.1–1.0)
Monocytes Relative: 9 %
Neutro Abs: 3.4 10*3/uL (ref 1.7–7.7)
Neutrophils Relative %: 68 %
Platelet Count: 159 10*3/uL (ref 150–400)
RBC: 2.75 MIL/uL — ABNORMAL LOW (ref 4.22–5.81)
RDW: 20.3 % — ABNORMAL HIGH (ref 11.5–15.5)
WBC Count: 5 10*3/uL (ref 4.0–10.5)
nRBC: 0 % (ref 0.0–0.2)

## 2024-01-19 LAB — CMP (CANCER CENTER ONLY)
ALT: 11 U/L (ref 0–44)
AST: 14 U/L — ABNORMAL LOW (ref 15–41)
Albumin: 3.4 g/dL — ABNORMAL LOW (ref 3.5–5.0)
Alkaline Phosphatase: 98 U/L (ref 38–126)
Anion gap: 7 (ref 5–15)
BUN: 10 mg/dL (ref 8–23)
CO2: 26 mmol/L (ref 22–32)
Calcium: 8.2 mg/dL — ABNORMAL LOW (ref 8.9–10.3)
Chloride: 108 mmol/L (ref 98–111)
Creatinine: 0.88 mg/dL (ref 0.61–1.24)
GFR, Estimated: >60 mL/min (ref >60)
Glucose, Bld: 113 mg/dL — ABNORMAL HIGH (ref 70–99)
Potassium: 3.9 mmol/L (ref 3.5–5.1)
Sodium: 141 mmol/L (ref 135–145)
Total Bilirubin: 1.3 mg/dL — ABNORMAL HIGH (ref 0.0–1.2)
Total Protein: 5.2 g/dL — ABNORMAL LOW (ref 6.5–8.1)

## 2024-01-19 NOTE — Progress Notes (Signed)
 [facility] [phone]   [phone]  PROGRESS NOTE  Patient Care Team: Dorrine Gaudy, DO as PCP - General (Family Medicine)  Hematological/Oncological History # Metastatic Castrate Sensitive Prostate Cancer  09/30/2021: percutaneous nephrostomy tube placement and retroperitoneal lymph node biopsy  10/01/2021: Firmagon  240 mg  09/2021: radiation therapy to the lumbar spine after receiving 30 Gray in 10 fractions  11/01/2021: started Eligard  30 mg every 4 months and Zytiga  1000 mg daily with prednisone  5 mg daily  09/25/2022: last visit with Dr. Dirk Fredericks 10/29/2022: transition care to Dr. Rosaline Coma. Insurance no longer assisting with Zytiga  1000 mg daily, converting enzalutamide  160 mg PO daily.   08/22/2023: PET scan shows progression 10/28/2023: cycle 1, day 1  of docetaxel  01/19/2024: Discontinue docetaxel  chemotherapy prior to cycle 5 as patient is not able to tolerate treatment.   Interval History:  Erik Page with medical history significant for [facility] presents for a follow up visit. The patient's last visit was on 12/29/2023. He presents today to start cycle 5, day 1 of docetaxel .   On exam today [name] reports he had a brutal time with his chemotherapy.  He reports that he was in bed for several days and his legs were "weak as water".  He had severe nausea and vomiting.  He was eating poorly and had poor energy.  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  He reports he also had pitting in his lower extremities, +2 pitting edema.  He reports that he has been thinking deeply about it and would like to discontinue chemotherapy altogether.  He would however be open to the option of Pluvicto which we discussed in detail today.  He otherwise denies any fevers, chills, sweats, or shortness of breath.  Full 10 point ROS is otherwise negative.  The bulk of our discussion focused on options for treatment moving forward.   We discussed Pluvicto in detail which she was agreeable to pursuing.  MEDICAL HISTORY:  Past Medical History:  Diagnosis Date   HTN (hypertension)    Myocardial infarction [facility])     SURGICAL HISTORY: Past Surgical History:  Procedure Laterality Date   CARDIAC CATHETERIZATION N/A 08/15/2015   Procedure: Left Heart Cath and Coronary Angiography;  Surgeon: Arleen Lacer, MD;  Location: [facility] INVASIVE CV LAB;  Service: Cardiovascular;  Laterality: N/A;   CARDIAC CATHETERIZATION N/A 08/15/2015   Procedure: Left Heart Cath and Coronary Angiography;  Surgeon: Arleen Lacer, MD;  [facility]AS[facility] CV LAB;  Service: Cardiovascular;  Laterality: N/A;   CARDIAC CATHETERIZATION N/A 08/15/2015   Procedure: Coronary Stent Intervention;  Surgeon: Arleen Lacer, MD;  Location: [facility] INVASIVE CV LAB;  Service: Cardiovascular;  Laterality: N/A;   IR CONVERT RIGHT NEPHROSTOMY TO NEPHROURETERAL CATH  11/12/2021   IR EXT NEPHROURETERAL CATH EXCHANGE  01/07/2022   IR NEPHROSTOMY EXCHANGE RIGHT  02/20/2022   IR NEPHROSTOMY PLACEMENT RIGHT  10/01/2021   TONSILLECTOMY      SOCIAL HISTORY: Social History   Socioeconomic History   Marital status: Married    Spouse name: Not on file   Number of children: Not on file   Years of education: Not on file   Highest education level: Not on file  Occupational History   Not on file  Tobacco Use   Smoking status: Never   Smokeless tobacco: Never  Vaping Use   Vaping status: Never Used  Substance and Sexual Activity  Alcohol use: No    Alcohol/week: 0.0 standard drinks of alcohol   Drug use: No   Sexual activity: Not on file  Other Topics Concern   Not on file  Social History Narrative   Works as a Pensions consultant; 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  MEDICATIONS:  Current Outpatient Medications  Medication Sig Dispense Refill   aspirin  EC 81 MG EC tablet Take 1 tablet (81 mg total) by mouth daily.     atorvastatin  (LIPITOR ) 80 MG tablet TAKE 1 TABLET BY MOUTH EVERY DAY 90 tablet 1   calcium -vitamin D (OSCAL WITH D) 500-5 MG-MCG tablet Take 1 tablet by mouth 2 (two) times daily. 90 tablet 3   feeding supplement (ENSURE ENLIVE / ENSURE PLUS) LIQD Take 237 mLs by mouth 2 (two) times daily between meals. 237 mL 12   ibuprofen  (ADVIL ) 800 MG tablet Take 1 tablet (800 mg total) by mouth 3 (three) times daily. 90 tablet 1   metoprolol  tartrate (LOPRESSOR ) 25 MG tablet Take 1 tablet (25 mg total) by mouth 2 (two) times daily. 180 tablet 1   nitroGLYCERIN  (NITROSTAT ) 0.4 MG SL tablet DISSOLVE 1 TAB UNDER TONGUE FOR CHEST PAIN - IF PAIN REMAINS AFTER 5 MIN, CALL 911 AND REPEAT DOSE. MAX 3 TABS IN 15 MINUTES (Patient taking differently: 0.4 mg every 5 (five) minutes as needed for chest pain.) 25 tablet 3   ondansetron  (ZOFRAN ) 8 MG tablet Take 1 tablet (8 mg total) by mouth every 8 (eight) hours as needed. 30 tablet 0   oxyCODONE  (OXY IR/ROXICODONE ) 5 MG immediate release tablet Take 2 tablets (10 mg total) by mouth every 4 (four) hours as needed for severe pain (pain score 7-10). 120 tablet 0   prochlorperazine  (COMPAZINE ) 10 MG tablet Take 1 tablet (10 mg total) by mouth every 6 (six) hours as needed for nausea or vomiting. 30 tablet 0   senna-docusate (SENOKOT-S) 8.6-50 MG tablet Take 1 tablet by mouth at bedtime as needed for mild constipation. 30 tablet 0   No current facility-administered medications for this visit.    REVIEW OF SYSTEMS:   Constitutional: ( - ) fevers, ( - )  chills , ( - ) night sweats Eyes: ( - ) blurriness of vision, ( - ) double vision, ( - )  watery eyes Ears, nose, mouth, throat, and face: ( - ) mucositis, ( - ) sore throat Respiratory: ( - ) cough, ( - ) dyspnea, ( - ) wheezes Cardiovascular: ( - ) palpitation, ( - ) chest discomfort, ( - ) lower extremity swelling Gastrointestinal:  ( - ) nausea, ( - ) heartburn, ( - ) change in bowel habits Skin: ( - ) abnormal skin rashes Lymphatics: ( - ) new lymphadenopathy, ( - ) easy bruising Neurological: ( - ) numbness, ( - ) tingling, ( - ) new weaknesses Behavioral/Psych: ( - ) mood change, ( - ) new changes  All other systems were reviewed with the patient and are negative.  PHYSICAL EXAMINATION: ECOG PERFORMANCE STATUS: 0 - Asymptomatic  Vitals:   01/19/24 1053  BP: 103/60  Pulse: 94  Resp: 16  Temp: 97.7 F (36.5 C)  SpO2: 100%    Filed Weights   01/19/24 1053  Weight: 244 lb 9.6 oz (110.9 kg)    GENERAL: Well-appearing elderly African-American Page, alert, no distress and comfortable SKIN: skin color, texture, turgor are normal, no rashes or significant lesions EYES: conjunctiva are pink and non-injected, sclera clear LUNGS: clear to auscultation and percussion with normal breathing effort HEART: regular rate & rhythm and no murmurs and no lower extremity edema Musculoskeletal: no cyanosis of digits and no clubbing  PSYCH: alert & oriented x 3, fluent speech NEURO: no focal motor/sensory deficits  LABORATORY DATA:  I have reviewed the data as listed    Latest Ref Rng & Units 01/19/2024   10:15 AM 12/29/2023    1:40 PM 12/08/2023    8:39 AM  CBC  WBC 4.0 - 10.5 K/uL 5.0  4.9  6.0   Hemoglobin 13.0 - 17.0 g/dL 9.1  9.1  9.7   Hematocrit 39.0 - 52.0 % 28.3  27.4  29.8   Platelets 150 - 400 K/uL 159  173  172        Latest Ref Rng & Units 01/19/2024   10:15 AM 12/29/2023    1:40 PM 12/08/2023    8:39 AM  CMP  Glucose 70 - 99 mg/dL 161  096  045   BUN 8 - 23 mg/dL 10  16  15    Creatinine 0.61 - 1.24 mg/dL 4.09  8.11  9.14   Sodium 135 - 145 mmol/L 141  141   141   Potassium 3.5 - 5.1 mmol/L 3.9  4.2  3.6   Chloride 98 - 111 mmol/L 108  109  108   CO2 22 - 32 mmol/L 26  28  27    Calcium  8.9 - 10.3 mg/dL 8.2  8.6  8.0   Total Protein 6.5 - 8.1 g/dL 5.2  5.4  5.7   Total Bilirubin 0.0 - 1.2 mg/dL 1.3  1.2  1.0   Alkaline Phos 38 - 126 U/L 98  114  140   AST 15 - 41 U/L 14  13  12    ALT 0 - 44 U/L 11  13  10      RADIOGRAPHIC STUDIES: No results found.    ASSESSMENT & PLAN Erik Page is a 73 y.o. Page with medical history significant for [facility] presents for a follow up visit.   # Metastatic Castrate Sensitive Prostate Cancer  --continue lupron  30mg  subq q 4 months indefinitely  --started Eligard  30 mg every 4 months and Zytiga  1000 mg daily with prednisone  5 mg daily on 11/01/2021:   --transition from Zytiga  to enzalutamide  160mg  PO daily due to cost. Was able to get approval to return to Zytiga  due to faillure of enzalutamide .  --PET scan from 08/22/2023 showed progression with multiple new and progressive bone lesions.  --Recommend to switch to docetaxel  q 3 weeks.  PLAN: --Due for cycle 5, day 1 of docetaxel  today.  Patient notes that he is not able to tolerate docetaxel  chemotherapy and would like to discontinue it.  Will cancel this in all future treatments today. --After discussion of treatment options moving forward the patient noted that he would like to pursue Pluvicto therapy.  Discussed risks and benefits. --Labs today show white blood cell count 5.0, hemoglobin 9.1, MCV 102.9, platelets 159. -- Last PSA 567.0 on 12/08/2023  --Will administer Xgeva  q  2 months, last one was 12/08/2023 so next one will be due around May 2025 --Will administer Lupron  every 4 months, last one was on 12/10/2023 so next one will be due around July 2025  -- RTC prior to the start of cycle 2 of Pluvicto.  #Hypokalemia: --Potassium level is 3.9.  Today --Advised to incorporate potassium rich foods into his diet.    Orders Placed This Encounter   Procedures   NM Radiologist Eval And Mgmt    Standing Status:   Future    Expected Date:   01/23/2024    Expiration Date:   01/18/2025    Reason for Exam (SYMPTOM  OR DIAGNOSIS REQUIRED):   evaluation for consideration of pluvicto therapy    If indicated for the ordered procedure, I authorize the administration of a radiopharmaceutical per Radiology protocol:   Yes    Preferred imaging location?:   [facility]    All questions were answered. The patient knows to call the clinic with any problems, questions or concerns.  A total of more than 30 minutes were spent on this encounter with face-to-face time and non-face-to-face time, including preparing to see the patient, ordering tests and/or medications, counseling the patient and coordination of care as outlined above.   Rogerio Clay, MD Department of Hematology/Oncology [facility] at [facility] Phone: [phone] Pager: [phone] Email: [email or web address]    01/19/2024 1:52 PM

## 2024-01-20 LAB — PROSTATE-SPECIFIC AG, SERUM (LABCORP): Prostate Specific Ag, Serum: 753 ng/mL — ABNORMAL HIGH (ref 0.0–4.0)

## 2024-01-21 ENCOUNTER — Inpatient Hospital Stay: Payer: Medicare Other

## 2024-01-21 ENCOUNTER — Other Ambulatory Visit: Payer: Self-pay | Admitting: Hematology and Oncology

## 2024-01-21 ENCOUNTER — Telehealth: Payer: Self-pay | Admitting: *Deleted

## 2024-01-21 DIAGNOSIS — C61 Malignant neoplasm of prostate: Secondary | ICD-10-CM

## 2024-01-21 MED ORDER — FUROSEMIDE 20 MG PO TABS: 20.0000 mg | ORAL_TABLET | Freq: Every day | ORAL | 1 refills | Status: DC

## 2024-01-21 MED ORDER — POTASSIUM CHLORIDE CRYS ER 20 MEQ PO TBCR: 20.0000 meq | EXTENDED_RELEASE_TABLET | Freq: Every day | ORAL | 1 refills | Status: DC

## 2024-01-21 NOTE — Telephone Encounter (Signed)
 Received vm message from pt requesting call  back. TCT patient and spoke with him. He states that the swelling in his legs has now moved up past his knees into his thighs. He also has some swelling in his hands. Advised that I would discuss with Dr. Rosaline Coma. Advised pt that Dr. Rosaline Coma has ordered Lasix  20 mg daily as needed for swelling and also KCL on the days he takes the lasix . Pt is agreeable to this plan. Encouraged him to increase his protein intake as well-that can help pull the extravascular fluid back in to his circulation. Pt voiced understanding. He will pick up these medications today.  He is aware of his appt in Nuclear Medicine tomorrow for the Pluvicto.

## 2024-01-22 ENCOUNTER — Encounter (HOSPITAL_COMMUNITY)
Admission: RE | Admit: 2024-01-22 | Discharge: 2024-01-22 | Disposition: A | Discharge status: Home / Self Care | Source: Ambulatory Visit | Attending: Hematology and Oncology | Admitting: Hematology and Oncology

## 2024-01-22 DIAGNOSIS — C61 Malignant neoplasm of prostate: Secondary | ICD-10-CM | POA: Insufficient documentation

## 2024-01-22 NOTE — Progress Notes (Addendum)
 Chief Complaint: Patient with metastatic castrate resistant prostate cancer. Evaluation for   Lu 177 PSMA therapy (Pluvicto).  Referring Physician(s):Dorsey     Patient Status: [facility] - Out-pt  History of Present Illness: Erik Page is a 73 y.o. male with castrate resistant metastatic prostate adenocarcinoma.  Original diagnosis 2023 with widespread skeletal metastasis.  Patient has undergone androgen deprivation with initial positive response and  subsequent failure.     Patient completed 4 cycles taxane chemotherapy with adverse reaction requiring termination of chemotherapy.  Chemotherapy initiated 10/28/2023     Patient had a PSMA PET scan on August 21 2024 which showed clear progression PSMA avid avid skeletal metastasis from PSMA PET scan December 2023.     Patient continues on Lupron  therapy.        Past Medical History:  Diagnosis Date   HTN (hypertension)    Myocardial infarction [facility])     Past Surgical History:  Procedure Laterality Date   CARDIAC CATHETERIZATION N/A 08/15/2015   Procedure: Left Heart Cath and Coronary Angiography;  Surgeon: Arleen Lacer, MD;  Location: [facility] INVASIVE CV LAB;  Service: Cardiovascular;  Laterality: N/A;   CARDIAC CATHETERIZATION N/A 08/15/2015   Procedure: Left Heart Cath and Coronary Angiography;  Surgeon: Arleen Lacer, MD;  Location: [facility] INVASIVE CV LAB;  Service: Cardiovascular;  Laterality: N/A;   CARDIAC CATHETERIZATION N/A 08/15/2015   Procedure: Coronary Stent Intervention;  Surgeon: Arleen Lacer, MD;  Location: [facility] INVASIVE CV LAB;  Service: Cardiovascular;  Laterality: N/A;   IR CONVERT RIGHT NEPHROSTOMY TO NEPHROURETERAL CATH  11/12/2021   IR EXT NEPHROURETERAL CATH EXCHANGE  01/07/2022   IR NEPHROSTOMY EXCHANGE RIGHT  02/20/2022   IR NEPHROSTOMY PLACEMENT RIGHT  10/01/2021   TONSILLECTOMY      Allergies: Patient has no known allergies.  Medications: Prior to Admission medications   Medication Sig Start  Date End Date Taking? Authorizing Provider  aspirin  EC 81 MG EC tablet Take 1 tablet (81 mg total) by mouth daily. 08/16/15   Ruddy Corral M, PA-C  atorvastatin  (LIPITOR ) 80 MG tablet TAKE 1 TABLET BY MOUTH EVERY DAY 08/27/23   Dorsey, John T IV, MD  calcium -vitamin D (OSCAL WITH D) 500-5 MG-MCG tablet Take 1 tablet by mouth 2 (two) times daily. 12/03/21   Shadad, Firas N, MD  feeding supplement (ENSURE ENLIVE / ENSURE PLUS) LIQD Take 237 mLs by mouth 2 (two) times daily between meals. 10/02/21   Samtani, Jai-Gurmukh, MD  furosemide  (LASIX ) 20 MG tablet Take 1 tablet (20 mg total) by mouth daily. As needed for swelling/edema in lags 01/21/24   Ander Bame, MD  ibuprofen  (ADVIL ) 800 MG tablet Take 1 tablet (800 mg total) by mouth 3 (three) times daily. 11/18/23   Dorsey, John T IV, MD  metoprolol  tartrate (LOPRESSOR ) 25 MG tablet Take 1 tablet (25 mg total) by mouth 2 (two) times daily. 10/08/23   Ander Bame, MD  nitroGLYCERIN  (NITROSTAT ) 0.4 MG SL tablet DISSOLVE 1 TAB UNDER TONGUE FOR CHEST PAIN - IF PAIN REMAINS AFTER 5 MIN, CALL 911 AND REPEAT DOSE. MAX 3 TABS IN 15 MINUTES Patient taking differently: 0.4 mg every 5 (five) minutes as needed for chest pain. 09/19/21   Laurann Pollock, MD  ondansetron  (ZOFRAN ) 8 MG tablet Take 1 tablet (8 mg total) by mouth every 8 (eight) hours as needed. 11/18/23   Dorsey, John T IV, MD  oxyCODONE  (OXY IR/ROXICODONE ) 5 MG immediate release tablet Take 2 tablets (  10 mg total) by mouth every 4 (four) hours as needed for severe pain (pain score 7-10). 12/11/23   Ander Bame, MD  potassium chloride  SA (KLOR-CON  M) 20 MEQ tablet Take 1 tablet (20 mEq total) by mouth daily. When taking Lasix  01/21/24   Dorsey, John T IV, MD  prochlorperazine  (COMPAZINE ) 10 MG tablet Take 1 tablet (10 mg total) by mouth every 6 (six) hours as needed for nausea or vomiting. 08/27/23   Dorsey, John T IV, MD  senna-docusate (SENOKOT-S) 8.6-50 MG tablet Take 1 tablet by mouth  at bedtime as needed for mild constipation. 10/02/21   Samtani, Jai-Gurmukh, MD     Family History  Problem Relation Age of Onset   Hypertension Other     Social History   Socioeconomic History   Marital status: Married    Spouse name: Not on file   Number of children: Not on file   Years of education: Not on file   Highest education level: Not on file  Occupational History   Not on file  Tobacco Use   Smoking status: Never   Smokeless tobacco: Never  Vaping Use   Vaping status: Never Used  Substance and Sexual Activity   Alcohol use: No    Alcohol/week: 0.0 standard drinks of alcohol   Drug use: No   Sexual activity: Not on file  Other Topics Concern   Not on file  Social History Narrative   Works as a Pensions consultant; 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  All other systems are negative.  Review of Systems Low extremity edema and weakness related to chemotherapy  No bone pain related to prostate skeletal mets.  No urinary incontinence   Vital Signs: There were no vitals taken for this visit.  Physical Exam  Well appearing male. Engaging personality. Imaging: No results found.  Labs: Recent Labs    11/18/23 0932 12/08/23 0839 12/29/23 1340 01/19/24 1015  PSA1 468.0* 567.0* 579.0* 753.0*    CBC: Recent Labs    11/18/23 0932 12/08/23 0839 12/29/23 1340 01/19/24 1015  WBC 5.9 6.0 4.9 5.0  HGB 11.5* 9.7* 9.1* 9.1*  HCT 34.9* 29.8* 27.4* 28.3*  PLT 231 172 173 159    COAGS: No results for input(s): "INR", "APTT" in the last 8760 hours.  BMP: Recent Labs    11/18/23 0932  12/08/23 0839 12/29/23 1340 01/19/24 1015  NA 142 141 141 141  K 4.2 3.6 4.2 3.9  CL 107 108 109 108  CO2 30 27 28 26   GLUCOSE 144* 111* 116* 113*  BUN 16 15 16 10   CALCIUM  8.7* 8.0* 8.6* 8.2*  CREATININE 0.88 0.91 0.86 0.88  GFRNONAA >60 >60 >60 >60    LIVER FUNCTION TESTS: Recent Labs    11/18/23 0932 12/08/23 0839 12/29/23 1340 01/19/24 1015  BILITOT 1.2 1.0 1.2 1.3*  AST 15 12* 13* 14*  ALT 17 10 13 11   ALKPHOS 162* 140* 114 98  PROT 6.2* 5.7* 5.4* 5.2*  ALBUMIN 3.9 3.6 3.5 3.4*    TUMOR MARKERS: No results for input(s): "  AFPTM", "CEA", "CA199", "CHROMOGA" in the last 8760 hours.  Most recent PSA increased to 753  Assessment and Plan:  [Patient is good  candidate Lu 177 PSMA therapy ( vipivotide tetraxetan).  Patient demonstrates moderate progression metastatic skeletal disease] identified on recent PSMA PET scan.  Additionally patient's PSA is gradually increasing.  Patient has demonstrated progression on androgen deprivation and taxane chemotherapy.   Patient explained major and minor risks and benefits of therapy.  Major benefit being progression-free survival.  Major risk being myelosuppression and renal toxicity. Minor toxicity of xerostomia.  All the patient's questions were answered.  Patient accompanied by daughter who was also present for consult.    Patient is scheduled for 6 treatments spaced 6 weeks apart.  Recommend following up with oncologist for CBC and CMP 1 week prior to each treatment to assess safety of continuing with therapy.      Thank you for this interesting consult.  I greatly enjoyed meeting Erik Page and look forward to participating in their care.  A copy of this report was sent to the requesting provider on this date.  Electronically Signed: Reino Carbo, MD 01/22/2024, 12:04 PM   I spent a total of  30 Minutes   in face to face in clinical consultation, greater than 50% of which was counseling/coordinating care for metastatic  neuroendocrine tumor.

## 2024-02-08 ENCOUNTER — Other Ambulatory Visit: Payer: Self-pay | Admitting: Hematology and Oncology

## 2024-02-08 DIAGNOSIS — C61 Malignant neoplasm of prostate: Secondary | ICD-10-CM

## 2024-02-08 NOTE — Progress Notes (Unsigned)
 [facility] [phone]   [phone]  PROGRESS NOTE  Patient Care Team: Dorrine Gaudy, DO as PCP - General (Family Medicine)  Hematological/Oncological History # Metastatic Castrate Sensitive Prostate Cancer  09/30/2021: percutaneous nephrostomy tube placement and retroperitoneal lymph node biopsy  10/01/2021: Firmagon  240 mg  09/2021: radiation therapy to the lumbar spine after receiving 30 Gray in 10 fractions  11/01/2021: started Eligard  30 mg every 4 months and Zytiga  1000 mg daily with prednisone  5 mg daily  09/25/2022: last visit with Dr. Dirk Fredericks 10/29/2022: transition care to Dr. Rosaline Coma. Insurance no longer assisting with Zytiga  1000 mg daily, converting enzalutamide  160 mg PO daily.   08/22/2023: PET scan shows progression 10/28/2023: cycle 1, day 1  of docetaxel  01/19/2024: Discontinue docetaxel  chemotherapy prior to cycle 5 as patient is not able to tolerate treatment.   Interval History:  Erik 24 73 y.o. male with medical history significant for [facility] presents for a follow up visit. The patient's last visit was on 01/19/2024. He presents today to prior to the start of Cycle 1 of Pluvicto.   On exam today Erik Page reports he is feeling better in the interim since our last visit.  He feels like he is bouncing back.  He is having some swelling in his lower extremity and feet/ankles.  He reports that he does his best to keep moving.  He notes his appetite is good he is eating well.  He is not having any pain anywhere other than the swelling in his legs.  He reports that when he does have some pain he does take 2 of ibuprofen  800 mg.  He had a good meeting with the nuclear medicine doctors and is really unable to pursue Pluvicto at this time.  He notes that he is optimistic but nervous about Pluvicto therapy.  He otherwise denies any fevers, chills, sweats, nausea, vomiting or diarrhea.  A full 10 point ROS is otherwise negative.  Today we discussed the  risks and benefits of Pluvicto therapy.  We discussed expected side effects and expectations moving forward.  He voiced understanding of our findings and plan.  He is willing and able to proceed.  MEDICAL HISTORY:  Past Medical History:  Diagnosis Date   HTN (hypertension)    Myocardial infarction [facility])     SURGICAL HISTORY: Past Surgical History:  Procedure Laterality Date   CARDIAC CATHETERIZATION N/A 08/15/2015   Procedure: Left Heart Cath and Coronary Angiography;  Surgeon: Arleen Lacer, MD;  Location: [facility] INVASIVE CV LAB;  Service: Cardiovascular;  Laterality: N/A;   CARDIAC CATHETERIZATION N/A 08/15/2015   Procedure: Left Heart Cath and Coronary Angiography;  Surgeon: Arleen Lacer, MD;  Location: [facility] INVASIVE CV LAB;  Service: Cardiovascular;  Laterality: N/A;   CARDIAC CATHETERIZATION N/A 08/15/2015   Procedure: Coronary Stent Intervention;  Surgeon: Arleen Lacer, MD;  Location: [facility] INVASIVE CV LAB;  Service: Cardiovascular;  Laterality: N/A;   IR CONVERT RIGHT NEPHROSTOMY TO NEPHROURETERAL CATH  11/12/2021   IR EXT NEPHROURETERAL CATH EXCHANGE  01/07/2022   IR NEPHROSTOMY EXCHANGE RIGHT  02/20/2022   IR NEPHROSTOMY PLACEMENT RIGHT  10/01/2021   TONSILLECTOMY      SOCIAL HISTORY: Social History   Socioeconomic History   Marital status: Married    Spouse name: Not on file   Number of children: Not on file   Years of education: Not on file   Highest education level: Not on file  Occupational History   Not on file  Tobacco Use   Smoking status: Never   Smokeless tobacco: Never  Vaping Use   Vaping status: Never Used  Substance and Sexual Activity   Alcohol use: No    Alcohol/week: 0.0 standard drinks of alcohol   Drug use: No   Sexual activity: Not on file  Other Topics Concern   Not on file  Social History Narrative   Works as a Pensions consultant; 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  MEDICATIONS:  Current Outpatient Medications  Medication Sig Dispense Refill   aspirin  EC 81 MG EC tablet Take 1 tablet (81 mg total) by mouth daily.     atorvastatin  (LIPITOR ) 80 MG tablet TAKE 1 TABLET BY MOUTH EVERY DAY 90 tablet 1   calcium -vitamin D (OSCAL WITH D) 500-5 MG-MCG tablet Take 1 tablet by mouth 2 (two) times daily. 90 tablet 3   feeding supplement (ENSURE ENLIVE / ENSURE PLUS) LIQD Take 237 mLs by mouth 2 (two) times daily between meals. 237 mL 12   furosemide  (LASIX ) 20 MG tablet TAKE 1 TABLET (20 MG TOTAL) BY MOUTH DAILY. AS NEEDED FOR SWELLING/EDEMA IN LEGS 90 tablet 1   ibuprofen  (ADVIL ) 800 MG tablet Take 1 tablet (800 mg total) by mouth 3 (three) times daily. 90 tablet 1   metoprolol  tartrate (LOPRESSOR ) 25 MG tablet Take 1 tablet (25 mg total) by mouth 2 (two) times daily. 180 tablet 1   nitroGLYCERIN  (NITROSTAT ) 0.4 MG SL tablet DISSOLVE 1 TAB UNDER TONGUE FOR CHEST PAIN - IF PAIN REMAINS AFTER 5 MIN, CALL 911 AND REPEAT DOSE. MAX 3 TABS IN 15 MINUTES (Patient taking differently: 0.4 mg every 5 (five) minutes as needed for chest pain.) 25 tablet 3   ondansetron  (ZOFRAN ) 8 MG tablet Take 1 tablet (8 mg total) by mouth every 8 (eight) hours as needed. 30 tablet 0   oxyCODONE  (OXY IR/ROXICODONE ) 5 MG immediate release tablet Take 2 tablets (10 mg total) by mouth every 4 (four) hours as needed for severe pain (pain score 7-10). 120 tablet 0   potassium chloride  SA (KLOR-CON  M) 20 MEQ tablet TAKE 1 TABLET (20 MEQ TOTAL) BY MOUTH DAILY. WHEN TAKING LASIX  90 tablet 1   prochlorperazine  (COMPAZINE ) 10 MG tablet Take 1 tablet (10 mg total) by mouth every 6 (six) hours as  needed for nausea or vomiting. 30 tablet 0   senna-docusate (SENOKOT-S) 8.6-50 MG tablet Take 1 tablet by mouth at bedtime as needed for mild constipation. 30 tablet 0   No current facility-administered medications for this visit.    REVIEW OF SYSTEMS:   Constitutional: ( - ) fevers, ( - )  chills , ( - ) night sweats Eyes: ( - ) blurriness of vision, ( - ) double vision, ( - ) watery eyes Ears, nose, mouth, throat, and face: ( - ) mucositis, ( - ) sore throat Respiratory: ( - ) cough, ( - ) dyspnea, ( - ) wheezes Cardiovascular: ( - ) palpitation, ( - ) chest discomfort, ( - ) lower extremity swelling Gastrointestinal:  ( - )  nausea, ( - ) heartburn, ( - ) change in bowel habits Skin: ( - ) abnormal skin rashes Lymphatics: ( - ) new lymphadenopathy, ( - ) easy bruising Neurological: ( - ) numbness, ( - ) tingling, ( - ) new weaknesses Behavioral/Psych: ( - ) mood change, ( - ) new changes  All other systems were reviewed with the patient and are negative.  PHYSICAL EXAMINATION: ECOG PERFORMANCE STATUS: 0 - Asymptomatic  Vitals:   02/09/24 0947  BP: 131/69  Pulse: 85  Resp: 14  Temp: (!) 97.4 F (36.3 C)  SpO2: 100%     Filed Weights   02/09/24 0947  Weight: 242 lb 14.4 oz (110.2 kg)     GENERAL: Well-appearing elderly African-American male, alert, no distress and comfortable SKIN: skin color, texture, turgor are normal, no rashes or significant lesions EYES: conjunctiva are pink and non-injected, sclera clear LUNGS: clear to auscultation and percussion with normal breathing effort HEART: regular rate & rhythm and no murmurs and no lower extremity edema Musculoskeletal: no cyanosis of digits and no clubbing  PSYCH: alert & oriented x 3, fluent speech NEURO: no focal motor/sensory deficits  LABORATORY DATA:  I have reviewed the data as listed    Latest Ref Rng & Units 02/09/2024    9:18 AM 01/19/2024   10:15 AM 12/29/2023    1:40 PM  CBC  WBC 4.0 - 10.5 K/uL 3.0   5.0  4.9   Hemoglobin 13.0 - 17.0 g/dL 8.6  9.1  9.1   Hematocrit 39.0 - 52.0 % 26.4  28.3  27.4   Platelets 150 - 400 K/uL 139  159  173        Latest Ref Rng & Units 02/09/2024    9:18 AM 01/19/2024   10:15 AM 12/29/2023    1:40 PM  CMP  Glucose 70 - 99 mg/dL 161  096  045   BUN 8 - 23 mg/dL 15  10  Page    Creatinine 0.61 - 1.24 mg/dL 4.09  8.11  9.14   Sodium 135 - 145 mmol/L 142  141  141   Potassium 3.5 - 5.1 mmol/L 3.8  3.9  4.2   Chloride 98 - 111 mmol/L 107  108  109   CO2 22 - 32 mmol/L 30  26  28    Calcium  8.9 - 10.3 mg/dL 8.5  8.2  8.6   Total Protein 6.5 - 8.1 g/dL 5.9  5.2  5.4   Total Bilirubin 0.0 - 1.2 mg/dL 1.3  1.3  1.2   Alkaline Phos 38 - 126 U/L 117  98  114   AST 15 - 41 U/L 14  14  13    ALT 0 - 44 U/L 18  11  13      RADIOGRAPHIC STUDIES: NM Radiologist Eval And Mgmt Result Date: 01/22/2024 EXAM: NEW PATIENT OFFICE VISIT CHIEF COMPLAINT: Castrate resistant metastatic prostate adenocarcinoma Current Pain Level: 1-10 HISTORY OF PRESENT ILLNESS: 73 year old male castrate resistant metastatic prostate adenocarcinoma. Original diagnosis 2023 with widespread skeletal metastasis. Patient has undergone androgen deprivation with initial positive response and subsequent failure. Patient completed 4 cycles taxane chemotherapy with adverse reaction requiring termination of chemotherapy. Chemotherapy initiated 10/28/2023 Patient had a PSMA PET scan on August 21 2024 which showed clear progression PSMA avid avid skeletal metastasis from PSMA PET scan December 2023. Patient continues on Lupron  therapy. REVIEW OF SYSTEMS: See epic note PHYSICAL EXAMINATION: See epic note ASSESSMENT AND PLAN: See epic note. Electronically Signed  By: Deboraha Fallow M.D.   On: 01/22/2024 12:22      ASSESSMENT & PLAN Erik Page is a 73 y.o. male with medical history significant for [facility] presents for a follow up visit.   # Metastatic Castrate Sensitive Prostate Cancer  --continue lupron  30mg   subq q 4 months indefinitely  --started Eligard  30 mg every 4 months and Zytiga  1000 mg daily with prednisone  5 mg daily on 11/01/2021:   --transition from Zytiga  to enzalutamide  160mg  PO daily due to cost. Was able to get approval to return to Zytiga  due to faillure of enzalutamide .  --PET scan from 08/22/2023 showed progression with multiple new and progressive bone lesions.  --Recommend to switch to docetaxel  q 3 weeks.  PLAN: --Due for cycle 5, day 1 of docetaxel  today.  Patient notes that he is not able to tolerate docetaxel  chemotherapy and would like to discontinue it.  Will cancel this in all future treatments today. --After discussion of treatment options moving forward the patient noted that he would like to pursue Pluvicto therapy.  Discussed risks and benefits. --Labs today show white blood cell count 3.0, hemoglobin 8.6, MCV 100, platelets 139 -- Last PSA 716 on 02/09/2024 --Will administer Xgeva  q 2 months, last one was 12/08/2023 so next one will be due around May 2025 --Will administer Lupron  every 4 months, last one was on 12/10/2023 so next one will be due around July 2025  -- RTC prior to the start of cycle 2 of Pluvicto.  #Hypokalemia: --Potassium level is 3.8.  Today --Advised to incorporate potassium rich foods into his diet.    No orders of the defined types were placed in this encounter.   All questions were answered. The patient knows to call the clinic with any problems, questions or concerns.  A total of more than 30 minutes were spent on this encounter with face-to-face time and non-face-to-face time, including preparing to see the patient, ordering tests and/or medications, counseling the patient and coordination of care as outlined above.   Rogerio Clay, MD Department of Hematology/Oncology [facility] at [facility] Phone: [phone] Pager: [phone] Email: [email or web address]    02/17/2024 8:42 PM

## 2024-02-09 ENCOUNTER — Inpatient Hospital Stay

## 2024-02-09 ENCOUNTER — Inpatient Hospital Stay: Payer: Medicare Other

## 2024-02-09 ENCOUNTER — Inpatient Hospital Stay: Payer: Medicare Other | Attending: Hematology and Oncology | Admitting: Hematology and Oncology

## 2024-02-09 VITALS — BP 131/69 | HR 85 | Temp 97.4°F | Resp 14 | Wt 242.9 lb

## 2024-02-09 DIAGNOSIS — C61 Malignant neoplasm of prostate: Secondary | ICD-10-CM

## 2024-02-09 DIAGNOSIS — Z7952 Long term (current) use of systemic steroids: Secondary | ICD-10-CM | POA: Diagnosis not present

## 2024-02-09 DIAGNOSIS — M25471 Effusion, right ankle: Secondary | ICD-10-CM | POA: Insufficient documentation

## 2024-02-09 DIAGNOSIS — E876 Hypokalemia: Secondary | ICD-10-CM | POA: Insufficient documentation

## 2024-02-09 DIAGNOSIS — C7951 Secondary malignant neoplasm of bone: Secondary | ICD-10-CM | POA: Diagnosis present

## 2024-02-09 DIAGNOSIS — M25472 Effusion, left ankle: Secondary | ICD-10-CM | POA: Diagnosis not present

## 2024-02-09 LAB — CMP (CANCER CENTER ONLY)
ALT: 18 U/L (ref 0–44)
AST: 14 U/L — ABNORMAL LOW (ref 15–41)
Albumin: 3.6 g/dL (ref 3.5–5.0)
Alkaline Phosphatase: 117 U/L (ref 38–126)
Anion gap: 5 (ref 5–15)
BUN: 15 mg/dL (ref 8–23)
CO2: 30 mmol/L (ref 22–32)
Calcium: 8.5 mg/dL — ABNORMAL LOW (ref 8.9–10.3)
Chloride: 107 mmol/L (ref 98–111)
Creatinine: 0.79 mg/dL (ref 0.61–1.24)
GFR, Estimated: >60 mL/min (ref >60)
Glucose, Bld: 119 mg/dL — ABNORMAL HIGH (ref 70–99)
Potassium: 3.8 mmol/L (ref 3.5–5.1)
Sodium: 142 mmol/L (ref 135–145)
Total Bilirubin: 1.3 mg/dL — ABNORMAL HIGH (ref 0.0–1.2)
Total Protein: 5.9 g/dL — ABNORMAL LOW (ref 6.5–8.1)

## 2024-02-09 LAB — CBC WITH DIFFERENTIAL (CANCER CENTER ONLY)
Abs Immature Granulocytes: 0.01 10*3/uL (ref 0.00–0.07)
Basophils Absolute: 0 10*3/uL (ref 0.0–0.1)
Basophils Relative: 0 %
Eosinophils Absolute: 0.1 10*3/uL (ref 0.0–0.5)
Eosinophils Relative: 3 %
HCT: 26.4 % — ABNORMAL LOW (ref 39.0–52.0)
Hemoglobin: 8.6 g/dL — ABNORMAL LOW (ref 13.0–17.0)
Immature Granulocytes: 0 %
Lymphocytes Relative: 21 %
Lymphs Abs: 0.6 10*3/uL — ABNORMAL LOW (ref 0.7–4.0)
MCH: 32.6 pg (ref 26.0–34.0)
MCHC: 32.6 g/dL (ref 30.0–36.0)
MCV: 100 fL (ref 80.0–100.0)
Monocytes Absolute: 0.2 10*3/uL (ref 0.1–1.0)
Monocytes Relative: 7 %
Neutro Abs: 2 10*3/uL (ref 1.7–7.7)
Neutrophils Relative %: 69 %
Platelet Count: 139 10*3/uL — ABNORMAL LOW (ref 150–400)
RBC: 2.64 MIL/uL — ABNORMAL LOW (ref 4.22–5.81)
RDW: 15.4 % (ref 11.5–15.5)
WBC Count: 3 10*3/uL — ABNORMAL LOW (ref 4.0–10.5)
nRBC: 0 % (ref 0.0–0.2)

## 2024-02-09 MED ORDER — DENOSUMAB 120 MG/1.7ML ~~LOC~~ SOLN
120.0000 mg | Freq: Once | SUBCUTANEOUS | Status: AC
Start: 2024-02-09 — End: 2024-02-09
  Administered 2024-02-09: 120 mg via SUBCUTANEOUS
  Filled 2024-02-09: qty 1.7

## 2024-02-09 NOTE — Progress Notes (Unsigned)
 OK for Xgeva  for corrected Ca++ of 8.7 per Dr. Rosaline Coma

## 2024-02-09 NOTE — Progress Notes (Signed)
 Ok to proceed with Xgeva  today with Ca= 8.5 per Dr. Rosaline Coma.  Doneen Ollinger, PharmD, MBA

## 2024-02-10 LAB — PROSTATE-SPECIFIC AG, SERUM (LABCORP): Prostate Specific Ag, Serum: 716 ng/mL — ABNORMAL HIGH (ref 0.0–4.0)

## 2024-02-10 LAB — TESTOSTERONE: Testosterone: <3 ng/dL — ABNORMAL LOW (ref 264–916)

## 2024-02-11 ENCOUNTER — Inpatient Hospital Stay: Payer: Medicare Other

## 2024-02-12 ENCOUNTER — Other Ambulatory Visit: Payer: Self-pay | Admitting: Hematology and Oncology

## 2024-02-17 ENCOUNTER — Encounter: Payer: Self-pay | Admitting: Hematology and Oncology

## 2024-02-18 NOTE — Written Directive (Cosign Needed)
  PLUVICTO  THERAPY   RADIOPHARMACEUTICAL: Lutetium 177 vipivotide tetraxetan (Pluvicto)     PRESCRIBED DOSE FOR ADMINISTRATION:  200 mCi   ROUTE OFADMINISTRATION:  IV   DIAGNOSIS: Prostate cancer metastatic to multiple sites, Prostate cancer metastatic to multiple sites [facility])    REFERRING PHYSICIAN: Ander Bame, MD   TREATMENT #: 1   ADDITIONAL PHYSICIAN COMMENTS/NOTES:   AUTHORIZED USER SIGNATURE & TIME STAMP: Reino Carbo, MD   02/19/24    9:21 AM

## 2024-02-19 ENCOUNTER — Encounter (HOSPITAL_COMMUNITY)
Admission: RE | Admit: 2024-02-19 | Discharge: 2024-02-19 | Disposition: A | Discharge status: Home / Self Care | Source: Ambulatory Visit | Attending: Hematology and Oncology | Admitting: Hematology and Oncology

## 2024-02-19 ENCOUNTER — Ambulatory Visit: Payer: Self-pay | Admitting: *Deleted

## 2024-02-19 ENCOUNTER — Inpatient Hospital Stay

## 2024-02-19 ENCOUNTER — Other Ambulatory Visit: Payer: Self-pay | Admitting: Hematology and Oncology

## 2024-02-19 VITALS — BP 114/67 | HR 80

## 2024-02-19 DIAGNOSIS — C61 Malignant neoplasm of prostate: Secondary | ICD-10-CM | POA: Diagnosis present

## 2024-02-19 LAB — CBC WITH DIFFERENTIAL (CANCER CENTER ONLY)
Abs Immature Granulocytes: 0.01 10*3/uL (ref 0.00–0.07)
Basophils Absolute: 0 10*3/uL (ref 0.0–0.1)
Basophils Relative: 1 %
Eosinophils Absolute: 0.1 10*3/uL (ref 0.0–0.5)
Eosinophils Relative: 4 %
HCT: 27.9 % — ABNORMAL LOW (ref 39.0–52.0)
Hemoglobin: 8.9 g/dL — ABNORMAL LOW (ref 13.0–17.0)
Immature Granulocytes: 0 %
Lymphocytes Relative: 28 %
Lymphs Abs: 0.9 10*3/uL (ref 0.7–4.0)
MCH: 31 pg (ref 26.0–34.0)
MCHC: 31.9 g/dL (ref 30.0–36.0)
MCV: 97.2 fL (ref 80.0–100.0)
Monocytes Absolute: 0.3 10*3/uL (ref 0.1–1.0)
Monocytes Relative: 8 %
Neutro Abs: 1.9 10*3/uL (ref 1.7–7.7)
Neutrophils Relative %: 59 %
Platelet Count: 224 10*3/uL (ref 150–400)
RBC: 2.87 MIL/uL — ABNORMAL LOW (ref 4.22–5.81)
RDW: 15.6 % — ABNORMAL HIGH (ref 11.5–15.5)
WBC Count: 3.3 10*3/uL — ABNORMAL LOW (ref 4.0–10.5)
nRBC: 0 % (ref 0.0–0.2)

## 2024-02-19 LAB — CMP (CANCER CENTER ONLY)
ALT: 8 U/L (ref 0–44)
AST: 10 U/L — ABNORMAL LOW (ref 15–41)
Albumin: 3.8 g/dL (ref 3.5–5.0)
Alkaline Phosphatase: 150 U/L — ABNORMAL HIGH (ref 38–126)
Anion gap: 6 (ref 5–15)
BUN: 17 mg/dL (ref 8–23)
CO2: 30 mmol/L (ref 22–32)
Calcium: 8.8 mg/dL — ABNORMAL LOW (ref 8.9–10.3)
Chloride: 106 mmol/L (ref 98–111)
Creatinine: 0.9 mg/dL (ref 0.61–1.24)
GFR, Estimated: >60 mL/min (ref >60)
Glucose, Bld: 97 mg/dL (ref 70–99)
Potassium: 4.4 mmol/L (ref 3.5–5.1)
Sodium: 142 mmol/L (ref 135–145)
Total Bilirubin: 0.7 mg/dL (ref 0.0–1.2)
Total Protein: 6.4 g/dL — ABNORMAL LOW (ref 6.5–8.1)

## 2024-02-19 MED ORDER — SODIUM CHLORIDE 0.9 % IV BOLUS: 1000.0000 mL | Freq: Once | INTRAVENOUS | Status: DC

## 2024-02-19 MED ORDER — LUTETIUM LU 177 VIPIVOTIDE TET 1000 MBQ/ML IV SOLN: 204.0000 | Freq: Once | INTRAVENOUS | Status: DC

## 2024-02-19 MED ORDER — SODIUM CHLORIDE 0.9 % IV BOLUS
1000.0000 mL | Freq: Once | INTRAVENOUS | Status: AC
  Administered 2024-02-19: 1000 mL via INTRAVENOUS

## 2024-02-19 NOTE — Telephone Encounter (Signed)
 TCT patient regarding recent lab results. Spoke with him. Advised that his PSA dropped slightly to 716. He voiced understanding. He had his first Pluvicto treatment today and he said it went well. Reviewed today's labs with him as well. Advised his HGB has come up slightly. He is pleased with that . He is aware of his future appts.

## 2024-02-19 NOTE — Progress Notes (Signed)
 CLINICAL DATA: [73 year old male with castrate resistant metastatic prostate carcinoma.  Extensive skeletal metastasis PSMA PET scan.]  EXAM: NUCLEAR MEDICINE PLUVICTO INJECTION  TECHNIQUE: Infusion: The nuclear medicine technologist and I personally verified the dose activity to be delivered as specified in the written directive, and verified the patient identification via 2 separate methods.  Initial flush of the intravenous catheter was performed was sterile saline. The dose syringe was connected to the catheter and the Lu-177 Pluvicto administered over a 1 to 10 min infusion. Single 10 cc  lushes with normal saline follow the dose. No complications were noted. The entire IV tubing, venocatheter, stopcock and syringes was removed in total, placed in a disposal bag and sent for assay of the residual activity, which will be reported at a later time in our EMR by the physics staff. Pressure was applied to the venipuncture site, and a compression bandage placed. Patient monitored for 1 hour following infusion.    Radiation Safety personnel were present to perform the discharge survey, as detailed on their documentation. After a short period of observation, the patient had his IV removed.  RADIOPHARMACEUTICALS: [Two hundred four] microcuries Lu-177 PLUVICTO  FINDINGS: Current Infusion: [1]  Planned Infusions: 6    Patient presented to nuclear medicine for treatment. The patient's most recent blood counts were reviewed and remains a adequate candidate to proceed with Lu-177 Pluvicto.    Hemoglobin equal 8.9.  Normal renal function.  Most recent PSA equal 716.       IMPRESSION: Current Infusion: [1]  Planned Infusions: 6    [The patient tolerated the infusion well. The patient will return in 6 weeks for ongoing care.]

## 2024-02-19 NOTE — Telephone Encounter (Signed)
-----   Message from Rogerio Clay IV sent at 02/17/2024  8:42 PM EDT ----- Please let [name] know that his PSA dropped slightly to 716.  Additionally he is currently scheduled for labs on 7/3 and 8/14, but we need clinic visits on those days too. ----- Message ----- From: Dannis Dy, Lab In [place] Sent: 02/09/2024   9:36 AM EDT To: Ander Bame, MD

## 2024-02-24 ENCOUNTER — Other Ambulatory Visit: Payer: Self-pay | Admitting: Hematology and Oncology

## 2024-03-02 ENCOUNTER — Other Ambulatory Visit: Payer: Self-pay | Admitting: Hematology and Oncology

## 2024-03-07 ENCOUNTER — Other Ambulatory Visit: Payer: Self-pay | Admitting: Hematology and Oncology

## 2024-03-23 ENCOUNTER — Telehealth: Payer: Self-pay | Admitting: Cardiology

## 2024-03-23 MED ORDER — NITROGLYCERIN 0.4 MG SL SUBL: SUBLINGUAL_TABLET | SUBLINGUAL | 3 refills | Status: AC

## 2024-03-23 NOTE — Progress Notes (Unsigned)
 Cardiology Office Note:  .   Date:  03/25/2024  ID:  Erik Page, DOB 09/14/1951, MRN [id number] PCP: No primary care provider on file.  [facility] Providers Cardiologist:  Alvan Carrier, MD {  History of Present Illness: .   Erik Page is a 73 y.o. male  with PMHx of CAD (NSTEMI s/p 07/2015 DES x 3 to proximal LCx, OM 2 and OM 3 due to complex bifurcation lesion; post cath recurrent CP with stable relook cath; 11/2015 Lexi scan with evidence of scar but no current ischemia), HLD, Metastatic Castrate Sensitive Prostate Cancer  who reports to office for follow up.   Last seen in heartcare OV 08/2021 with Dr. Alvan for follow-up.  Denies any cardiac complaints.  EKG showed NSR with chronic RBBB/LAFB.  No med changes.  Requested labs from PCP.  Today, reports intermittent chest pain x21m/o described as dull lasting up to 45 minutes happening nearly daily or every other day, occurs mainly at rest,   3/10, located on the left side of chest sometimes radiates to the left arm.  He tried NTG x 2 yesterday and noted relief.  Associated with dizziness.  Notes he started a new prostate cancer medication, Pluvicto , on 5/29 and has noticed CP since that time. Denies shortness of breath, palpitations, syncope, presyncope, orthopnea, PND, swelling or significant weight changes, acute bleeding, or claudication.  Reports compliance with medications. Reports unhealthy diet and poor fluid intake. Able to wash in cars without any exertional symptoms. Denies tobacco use/Binging ETOH/drug use    ROS: 10 point review of system has been reviewed and considered negative except ones been listed in the HPI.   Studies Reviewed: SABRA   EKG Interpretation Date/Time:  Thursday March 25 2024 14:46:38 EDT Ventricular Rate:  88 PR Interval:  170 QRS Duration:  126 QT Interval:  410 QTC Calculation: 496 R Axis:   234  Text Interpretation: Normal sinus rhythm Chronic Right bundle branch block Non-specific ST-t  changes When compared with ECG of 16-Aug-2015 06:41, Inferior infarct is now Present Questionable change in initial forces of Lateral leads Nonspecific T wave abnormality no longer evident in Lateral leads Confirmed by Sheron Hallmark ([id number]) on 03/25/2024 2:52:14 PM   10/14/14 initial cath Prox Cx to Mid Cx lesion, 99% stenosed. PCI with Promus Premier DES or 4.0 mm x 16 mm DES (4.6 mm). Post intervention, there is a 0% residual stenosis. Ost 1st Mrg to 1st Mrg lesion, 90% stenosed. Too small for PCI. Ost 2nd Mrg to 2nd Mrg lesion, 90% stenosed. PCI with Promus Premier DES 275 mm x 12 mm (2.6 mm) Post intervention, there is a 0% residual stenosis. 3rd Mrg lesion, 80% stenosed. PCI with Promus Premier DES 2.75 mm x 24 mm overlapping the proximal circumflex stent (tapered 4.2-3.0 mm) Post intervention, there is a 0% residual stenosis. Well-preserved LV function with no regional wall motion modalities   Severe bifurcation circumflex disease with proximal circumflex, OM 2 and OM 3 involvement. Successful difficult PCI involving proximal Circumflex-OM 2 and-OM 3 overlap segment. Rescue PCI of OM 2.   Plan: Continue Integrilin  for 2 more hours post PCI. Convert from Plavix  to Brilinta  Expected discharge tomorrow. If he has mild residual pain, would restart nitroglycerin  infusion.   08/15/2015 relook cath Patent stents in proximal Circumflex into OM3 as well as Ostial OM2. Ost 1st Mrg to 1st Mrg lesion, 90% stenosed - the likely culprit lesion for his angina Dist Cx lesion, 30% stenosed  11/2015 nuclear stress There  was no ST segment deviation noted during stress. Findings consistent with prior myocardial infarction. This is an intermediate risk study. The left ventricular ejection fraction is moderately decreased (30-44%).  Physical Exam:   VS:  There were no vitals taken for this visit.   Wt Readings from Last 3 Encounters:  03/25/24 229 lb 3.2 oz (104 kg)  02/09/24 242 lb 14.4 oz (110.2  kg)  01/19/24 244 lb 9.6 oz (110.9 kg)    GEN: Well nourished, well developed in no acute distress while sitting in chair.  NECK: No JVD; No carotid bruits CARDIAC: RRR, no murmurs, rubs, gallops RESPIRATORY:  Clear to auscultation without rales, wheezing or rhonchi  ABDOMEN: Soft, non-tender, non-distended EXTREMITIES:  No edema; No deformity   ASSESSMENT AND PLAN: .   CAD  HLD, LDL goal < 70 Atypical Chest Pain NSTEMI s/p 07/2015 DES x 3 to proximal LCx, OM 2 and OM 3 due to complex bifurcation lesion; post cath recurrent CP with stable relook cath;  11/2015 Lexi scan with evidence of scar but no current ischemia EKG today showed NSR with chronic RBBB and non specific ST changes.  Reports intermittent chest pain x63m/o described as dull lasting up to 45 minutes happening nearly daily or every other day, occurs mainly at rest,   3/10, located on the left side of chest sometimes radiates to the left arm.  He tried NTG x 2 yesterday and noted relief.  Associated with dizziness.  Notes he started a new prostate cancer medication, Pluvicto , on 5/29 and has noticed CP since that time. Denies any exertional CP. Reports that oncology reviewed the side effects of Pluvicto  and it does not have chest pain as a side effects.  I also reviewed side effects in Clinical Key and confirmed no CP side effects. Also reports being under a lot of stress and concern for anxiety.   Encouraged Lexiscan, however, patient does not think that he could walk on treadmill and is not interested in injecting any additional medications at this time due to his body already being under a lot of stress. Discussed that Lexiscan has a very short half life and we have a reversal agent.  Patient would prefer conservative management. Denies any headaches.   Ordered Imdur 30 mg.  Discussed taking 1/2 pill for the first 5 days then continue with whole pill.  Discussed if no improvement with chest pain on Imdur and then we can reconsider  Lexiscan.  01/2024 AST/ALT WNL. No FLP  since 2016. Order FLP. Patient would prefer to get labs completed at [facility] with upcoming labs for Dr. Federico. Will forward note to Dr. Federico.  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 Norleen Federico. Last seen 02/09/2024. Continue to follow.     Dispo: Follow up in September with JB.  Signed, Lorette CINDERELLA Kapur, PA-C

## 2024-03-23 NOTE — Telephone Encounter (Signed)
   Pt c/o of Chest Pain: STAT if active CP, including tightness, pressure, jaw pain, radiating pain to shoulder/upper arm/back, CP unrelieved by Nitro. Symptoms reported of SOB, nausea, vomiting, sweating.  1. Are you having CP right now? no   2. Are you experiencing any other symptoms (ex. SOB, nausea, vomiting, sweating)? Arm pain   3. Is your CP continuous or coming and going? continuous   4. Have you taken Nitroglycerin ? no   5. How long have you been experiencing CP? 1 month    6. If NO CP at time of call then end call with telling Pt to call back or call 911 if Chest pain returns prior to return call from triage team.

## 2024-03-23 NOTE — Telephone Encounter (Addendum)
 Patient has metastatic prostate cancer who received a new new chemotherapy IV starting 02/19/24. Shortly thereafter, he has been experiencing dull aching chest pain.It occurs anytime he says, the last was on Sunday at church which he described as dull ache on the left side of his chest with radiation to his left arm which made his arm foolish. This last for 1 hour. He has not had NTG in a long time. I called in a new rx for him. He is currently pain free. We discussed when he needs to go to the ED if NTG x 3 is ineffective. I will message the DOD.  Appointment made this week 03/25/24 to see B.Strader,PA-C

## 2024-03-25 ENCOUNTER — Ambulatory Visit (INDEPENDENT_AMBULATORY_CARE_PROVIDER_SITE_OTHER): Admitting: Physician Assistant

## 2024-03-25 ENCOUNTER — Inpatient Hospital Stay: Admitting: Hematology and Oncology

## 2024-03-25 ENCOUNTER — Other Ambulatory Visit: Payer: Self-pay | Admitting: *Deleted

## 2024-03-25 ENCOUNTER — Encounter: Payer: Self-pay | Admitting: Physician Assistant

## 2024-03-25 ENCOUNTER — Inpatient Hospital Stay

## 2024-03-25 VITALS — BP 98/60 | HR 88 | Ht 73.0 in | Wt 231.0 lb

## 2024-03-25 VITALS — BP 112/64 | HR 84 | Temp 98.3°F | Resp 15 | Wt 229.2 lb

## 2024-03-25 DIAGNOSIS — E785 Hyperlipidemia, unspecified: Secondary | ICD-10-CM | POA: Insufficient documentation

## 2024-03-25 DIAGNOSIS — R079 Chest pain, unspecified: Secondary | ICD-10-CM | POA: Insufficient documentation

## 2024-03-25 DIAGNOSIS — C61 Malignant neoplasm of prostate: Secondary | ICD-10-CM

## 2024-03-25 DIAGNOSIS — Z7952 Long term (current) use of systemic steroids: Secondary | ICD-10-CM | POA: Insufficient documentation

## 2024-03-25 DIAGNOSIS — E876 Hypokalemia: Secondary | ICD-10-CM | POA: Insufficient documentation

## 2024-03-25 DIAGNOSIS — C7951 Secondary malignant neoplasm of bone: Secondary | ICD-10-CM | POA: Insufficient documentation

## 2024-03-25 DIAGNOSIS — I25110 Atherosclerotic heart disease of native coronary artery with unstable angina pectoris: Secondary | ICD-10-CM | POA: Insufficient documentation

## 2024-03-25 DIAGNOSIS — Z7982 Long term (current) use of aspirin: Secondary | ICD-10-CM | POA: Insufficient documentation

## 2024-03-25 LAB — CBC WITH DIFFERENTIAL (CANCER CENTER ONLY)
Abs Immature Granulocytes: 0.01 10*3/uL (ref 0.00–0.07)
Basophils Absolute: 0 10*3/uL (ref 0.0–0.1)
Basophils Relative: 0 %
Eosinophils Absolute: 0.1 10*3/uL (ref 0.0–0.5)
Eosinophils Relative: 2 %
HCT: 30.6 % — ABNORMAL LOW (ref 39.0–52.0)
Hemoglobin: 10.1 g/dL — ABNORMAL LOW (ref 13.0–17.0)
Immature Granulocytes: 0 %
Lymphocytes Relative: 20 %
Lymphs Abs: 0.5 10*3/uL — ABNORMAL LOW (ref 0.7–4.0)
MCH: 30.7 pg (ref 26.0–34.0)
MCHC: 33 g/dL (ref 30.0–36.0)
MCV: 93 fL (ref 80.0–100.0)
Monocytes Absolute: 0.2 10*3/uL (ref 0.1–1.0)
Monocytes Relative: 7 %
Neutro Abs: 1.9 10*3/uL (ref 1.7–7.7)
Neutrophils Relative %: 71 %
Platelet Count: 146 10*3/uL — ABNORMAL LOW (ref 150–400)
RBC: 3.29 MIL/uL — ABNORMAL LOW (ref 4.22–5.81)
RDW: 15.5 % (ref 11.5–15.5)
WBC Count: 2.7 10*3/uL — ABNORMAL LOW (ref 4.0–10.5)
nRBC: 0 % (ref 0.0–0.2)

## 2024-03-25 LAB — CMP (CANCER CENTER ONLY)
ALT: 11 U/L (ref 0–44)
AST: 12 U/L — ABNORMAL LOW (ref 15–41)
Albumin: 3.9 g/dL (ref 3.5–5.0)
Alkaline Phosphatase: 269 U/L — ABNORMAL HIGH (ref 38–126)
Anion gap: 4 — ABNORMAL LOW (ref 5–15)
BUN: 15 mg/dL (ref 8–23)
CO2: 29 mmol/L (ref 22–32)
Calcium: 8.6 mg/dL — ABNORMAL LOW (ref 8.9–10.3)
Chloride: 107 mmol/L (ref 98–111)
Creatinine: 0.78 mg/dL (ref 0.61–1.24)
GFR, Estimated: >60 mL/min (ref >60)
Glucose, Bld: 125 mg/dL — ABNORMAL HIGH (ref 70–99)
Potassium: 4.2 mmol/L (ref 3.5–5.1)
Sodium: 140 mmol/L (ref 135–145)
Total Bilirubin: 1.1 mg/dL (ref 0.0–1.2)
Total Protein: 6.4 g/dL — ABNORMAL LOW (ref 6.5–8.1)

## 2024-03-25 MED ORDER — ISOSORBIDE MONONITRATE ER 30 MG PO TB24: 30.0000 mg | ORAL_TABLET | Freq: Every day | ORAL | 3 refills | Status: DC

## 2024-03-25 NOTE — Patient Instructions (Signed)
 Medication Instructions:   START Imdur 30 mg - FOR THE FIRST 5 DAYS ,TAKE 1/2 TABLET ( 15 MG total), then increase to whole tablet (30 mg )    HOLD Lopressor  until we review bp log in 2 weeks  Labwork: Fasting lipid to be done at [facility]  Testing/Procedures: None today  Follow-Up: September with Dr.Branch  Any Other Special Instructions Will Be Listed Below (If Applicable).    INCREASE water intake to 60 ounces daily   Keep daily blood pressure log for two weeks and return to office for review.  If you need a refill on your cardiac medications before your next appointment, please call your pharmacy.

## 2024-03-25 NOTE — Progress Notes (Signed)
 [facility] [phone]   [phone]  PROGRESS NOTE  Patient Care Team: Alvan Dorn FALCON, MD as PCP - Cardiology (Cardiology)  Hematological/Oncological History # Metastatic Castrate Sensitive Prostate Cancer  09/30/2021: percutaneous nephrostomy tube placement and retroperitoneal lymph node biopsy  10/01/2021: Firmagon  240 mg  09/2021: radiation therapy to the lumbar spine after receiving 30 Gray in 10 fractions  11/01/2021: started Eligard  30 mg every 4 months and Zytiga  1000 mg daily with prednisone  5 mg daily  09/25/2022: last visit with Dr. Amadeo 10/29/2022: transition care to Dr. Federico. Insurance no longer assisting with Zytiga  1000 mg daily, converting enzalutamide  160 mg PO daily.   08/22/2023: PET scan shows progression 10/28/2023: cycle 1, day 1  of docetaxel  01/19/2024: Discontinue docetaxel  chemotherapy prior to cycle 5 as patient is not able to tolerate treatment.  02/25/2024: Dose 1 of Pluvicto  therapy.   Interval History:  Erik Page [age] y.o. male with medical history significant for [facility] presents for a follow up visit. The patient's last visit was on 02/09/2024. He presents today to prior to the start of Cycle 2 of Pluvicto .   On exam today Erik Page reports he has been having some issues with chest pain and will be seen by his cardiologist Anapen later today at 230.  He reports that he has been taking nitroglycerin  and not providing him much help.  He is hoping that his chest pain is secondary to stress.  He reports he is not having any associated shortness of breath.  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  He reports that his weight is down, currently at 231 pounds, down from 242 pounds at our last visit.  He reports that Pluvicto  has given him new life.  He has the energy to wash his car and do his day-to-day activities without difficulty.  He is also doing his  best to try to deal with his stress at home.  He notes that he ate 3 meals of liver in order to try to help bolster his blood counts.  He reports he is not having any major side effects as result of his Pluvicto .  Otherwise he has been well and has no questions concerns or complaints today.  A full 10 point ROS is otherwise negative.  He is willing and able to continue with Pluvicto .  MEDICAL HISTORY:  Past Medical History:  Diagnosis Date   HTN (hypertension)    Myocardial infarction [facility])     SURGICAL HISTORY: Past Surgical History:  Procedure Laterality Date   CARDIAC CATHETERIZATION N/A 08/15/2015   Procedure: Left Heart Cath and Coronary Angiography;  Surgeon: Alm LELON Clay, MD;  Location: [facility] INVASIVE CV LAB;  Service: Cardiovascular;  Laterality: N/A;   CARDIAC CATHETERIZATION N/A 08/15/2015   Procedure: Left Heart Cath and Coronary Angiography;  Surgeon: Alm LELON Clay, MD;  Location: [facility] INVASIVE CV LAB;  Service: Cardiovascular;  Laterality: N/A;   CARDIAC CATHETERIZATION N/A 08/15/2015   Procedure: Coronary Stent Intervention;  Surgeon: Alm LELON Clay, MD;  Location: [facility] INVASIVE CV LAB;  Service: Cardiovascular;  Laterality: N/A;   IR CONVERT RIGHT NEPHROSTOMY TO NEPHROURETERAL CATH  11/12/2021   IR EXT NEPHROURETERAL CATH EXCHANGE  01/07/2022   IR NEPHROSTOMY EXCHANGE RIGHT  02/20/2022   IR NEPHROSTOMY PLACEMENT RIGHT  10/01/2021   TONSILLECTOMY      SOCIAL HISTORY: Social History   Socioeconomic History   Marital status: Married  Spouse name: Not on file   Number of children: Not on file   Years of education: Not on file   Highest education level: Not on file  Occupational History   Not on file  Tobacco Use   Smoking status: Never   Smokeless tobacco: Never  Vaping Use   Vaping status: Never Used  Substance and Sexual Activity   Alcohol use: No    Alcohol/week: 0.0 standard drinks of alcohol   Drug use: No   Sexual activity: Not on file  Other Topics Concern    Not on file  Social History Narrative   Works as a Pensions consultant; 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  MEDICATIONS:  Current Outpatient Medications  Medication Sig Dispense Refill   aspirin  EC 81 MG EC tablet Take 1 tablet (81 mg total) by mouth daily.     atorvastatin  (LIPITOR ) 80 MG tablet TAKE 1 TABLET BY MOUTH EVERY DAY 90 tablet 1   calcium -vitamin D (OSCAL WITH D) 500-5 MG-MCG tablet Take 1 tablet by mouth 2 (two) times daily. 90 tablet 3   feeding supplement (ENSURE ENLIVE / ENSURE PLUS) LIQD Take 237 mLs by mouth 2 (two) times daily between meals. 237 mL 12   furosemide  (LASIX ) 20 MG tablet TAKE 1 TABLET (20 MG TOTAL) BY MOUTH DAILY. AS NEEDED FOR SWELLING/EDEMA IN LEGS 90 tablet 1   ibuprofen  (ADVIL ) 800 MG tablet TAKE 1 TABLET BY MOUTH THREE TIMES A DAY 90 tablet 1   isosorbide mononitrate (IMDUR) 30 MG 24 hr tablet Take 1 tablet (30 mg total) by mouth daily. 90 tablet 3   metoprolol  tartrate (LOPRESSOR ) 25 MG tablet Take 1 tablet (25 mg total) by mouth 2 (two) times daily. 180 tablet 1   nitroGLYCERIN  (NITROSTAT ) 0.4 MG SL tablet DISSOLVE 1 TAB UNDER TONGUE FOR CHEST PAIN - IF PAIN REMAINS AFTER 5 MIN, CALL 911 AND REPEAT DOSE. MAX 3 TABS IN 15 MINUTES 25 tablet 3   ondansetron  (ZOFRAN ) 8 MG tablet TAKE 1 TABLET BY MOUTH EVERY 8 HOURS AS NEEDED 30 tablet 0   oxyCODONE  (OXY IR/ROXICODONE ) 5 MG immediate release tablet Take 2 tablets (10 mg total) by mouth every 4 (four) hours as needed for severe pain (pain score 7-10). 120 tablet 0   potassium chloride  SA (KLOR-CON  M) 20 MEQ tablet TAKE 1 TABLET  (20 MEQ TOTAL) BY MOUTH DAILY. WHEN TAKING LASIX  90 tablet 1   prochlorperazine  (COMPAZINE ) 10 MG tablet Take 1 tablet (10 mg total) by mouth every 6 (six) hours as needed for nausea or vomiting. 30 tablet 0   senna-docusate (SENOKOT-S) 8.6-50 MG tablet Take 1 tablet by mouth at bedtime as needed for mild constipation. 30 tablet 0   No current facility-administered medications for this visit.    REVIEW OF SYSTEMS:   Constitutional: ( - ) fevers, ( - )  chills , ( - ) night sweats Eyes: ( - ) blurriness of vision, ( - ) double vision, ( - ) watery eyes Ears, nose, mouth, throat, and face: ( - )  mucositis, ( - ) sore throat Respiratory: ( - ) cough, ( - ) dyspnea, ( - ) wheezes Cardiovascular: ( - ) palpitation, ( - ) chest discomfort, ( - ) lower extremity swelling Gastrointestinal:  ( - ) nausea, ( - ) heartburn, ( - ) change in bowel habits Skin: ( - ) abnormal skin rashes Lymphatics: ( - ) new lymphadenopathy, ( - ) easy bruising Neurological: ( - ) numbness, ( - ) tingling, ( - ) new weaknesses Behavioral/Psych: ( - ) mood change, ( - ) new changes  All other systems were reviewed with the patient and are negative.  PHYSICAL EXAMINATION: ECOG PERFORMANCE STATUS: 0 - Asymptomatic  Vitals:   03/25/24 0949  BP: 112/64  Pulse: 84  Resp: 15  Temp: 98.3 F (36.8 C)  SpO2: 100%      Filed Weights   03/25/24 0949  Weight: 229 lb 3.2 oz (104 kg)      GENERAL: Well-appearing elderly African-American male, alert, no distress and comfortable SKIN: skin color, texture, turgor are normal, no rashes or significant lesions EYES: conjunctiva are pink and non-injected, sclera clear LUNGS: clear to auscultation and percussion with normal breathing effort HEART: regular rate & rhythm and no murmurs and no lower extremity edema Musculoskeletal: no cyanosis of digits and no clubbing  PSYCH: alert & oriented x 3, fluent speech NEURO: no focal motor/sensory deficits  LABORATORY DATA:   I have reviewed the data as listed    Latest Ref Rng & Units 03/25/2024    9:15 AM 02/19/2024    8:42 AM 02/09/2024    9:18 AM  CBC  WBC 4.0 - 10.5 K/uL 2.7  3.3  3.0   Hemoglobin 13.0 - 17.0 g/dL 89.8  8.9  8.6   Hematocrit 39.0 - 52.0 % 30.6  27.9  26.4   Platelets 150 - 400 K/uL 146  224  139        Latest Ref Rng & Units 03/25/2024    9:15 AM 02/19/2024    8:42 AM 02/09/2024    9:18 AM  CMP  Glucose 70 - 99 mg/dL 874  97  880   BUN 8 - 23 mg/dL 15  17  15    Creatinine 0.61 - 1.24 mg/dL 9.21  9.09  9.20   Sodium 135 - 145 mmol/L 140  142  142   Potassium 3.5 - 5.1 mmol/L 4.2  4.4  3.8   Chloride 98 - 111 mmol/L 107  106  107   CO2 22 - 32 mmol/L 29  30  30    Calcium  8.9 - 10.3 mg/dL 8.6  8.8  8.5   Total Protein 6.5 - 8.1 g/dL 6.4  6.4  5.9   Total Bilirubin 0.0 - 1.2 mg/dL 1.1  0.7  1.3   Alkaline Phos 38 - 126 U/L 269  150  117   AST 15 - 41 U/L 12  10  14    ALT 0 - 44 U/L 11  8  18      RADIOGRAPHIC STUDIES: No results found.   ASSESSMENT & PLAN Erik Page is a 73 y.o. male with medical history significant for [facility] presents for a follow up visit.   # Metastatic Castrate Sensitive Prostate Cancer  --continue lupron  30mg  subq q 4 months indefinitely  --started Eligard  30 mg every 4 months and Zytiga  1000 mg daily with prednisone  5 mg daily on 11/01/2021:   --transition from Zytiga  to enzalutamide  160mg  PO daily due to cost. Was  able to get approval to return to Zytiga  due to faillure of enzalutamide .  --PET scan from 08/22/2023 showed progression with multiple new and progressive bone lesions.  --treatment 1 of Pluvicto  administered on 02/25/2024.  PLAN: --Due for cycle 2 of Pluvicto  next week.  --After discussion of treatment options moving forward the patient noted that he would like to pursue Pluvicto  therapy.  Discussed risks and benefits. --Labs today show white blood cell count 2.7, Hgb 10.1, MCV 93, Plt 146  -- Last PSA 716 on 02/09/2024 --Will administer Xgeva  q  2 months, last one was 02/09/2024. Next due in July 2025.  --Will administer Lupron  every 4 months, last one was on 12/10/2023 so next one will be due around July 2025  -- RTC prior to the start of cycle 3 of Pluvicto . Interval Lupron  and Xgeva  shots.   #Hypokalemia: --Potassium level is 4.2  --Advised to incorporate potassium rich foods into his diet.    No orders of the defined types were placed in this encounter.   All questions were answered. The patient knows to call the clinic with any problems, questions or concerns.  A total of more than 30 minutes were spent on this encounter with face-to-face time and non-face-to-face time, including preparing to see the patient, ordering tests and/or medications, counseling the patient and coordination of care as outlined above.   Norleen IVAR Kidney, MD Department of Hematology/Oncology [facility] Phone: [phone] Pager: [phone] Email: [email or web address]   03/25/2024 5:20 PM

## 2024-03-26 LAB — PROSTATE-SPECIFIC AG, SERUM (LABCORP): Prostate Specific Ag, Serum: 1213 ng/mL — ABNORMAL HIGH (ref 0.0–4.0)

## 2024-03-26 LAB — TESTOSTERONE: Testosterone: <3 ng/dL — ABNORMAL LOW (ref 264–916)

## 2024-03-29 ENCOUNTER — Telehealth: Payer: Self-pay | Admitting: Cardiology

## 2024-03-29 ENCOUNTER — Telehealth: Payer: Self-pay | Admitting: Physician Assistant

## 2024-03-29 ENCOUNTER — Encounter: Payer: Self-pay | Admitting: *Deleted

## 2024-03-29 ENCOUNTER — Telehealth: Payer: Self-pay | Admitting: Hematology and Oncology

## 2024-03-29 DIAGNOSIS — R079 Chest pain, unspecified: Secondary | ICD-10-CM

## 2024-03-29 NOTE — Telephone Encounter (Signed)
 Scheduled appointment per 7/1 los. Talked with the patient and he is aware of the made appointment.

## 2024-03-29 NOTE — Telephone Encounter (Signed)
 Requesting explanation from 07/03 EKG interpretation Inferior infarct now present. Reports this is a new finding. Advised that message would be sent to provider for clarification.

## 2024-03-29 NOTE — Telephone Encounter (Signed)
     I called and spoke with patient about recent EKG on 03/25/2024. Discussed that EKG showed NSR with chronic RBBB and Non-specific ST-t changes. The inferior infarct now present was not accurate and was removed from EKG interpretation.  Patient is aware and understands.  Patient reported ongoing chest pain (dull, 3/10, located on left side of chest, occurring at rest) similar to recent office visit and has not improved with Imdur .  He notes taking Imdur  at night.  Recommended taking Imdur  in the morning.  Discussed reconsidering stress test evaluation. Patient willing to proceed with stress test.  He would like to try exercise Myoview and if he is not able to achieve target heart rate then he is willing to switch to Lexiscan.  Will order stress test.   Signed, Lorette CINDERELLA Kapur, PA-C 03/29/2024, 3:17 PM Pager: [phone]

## 2024-03-29 NOTE — Telephone Encounter (Signed)
  Pt is requesting to speak with Erik Page, he said its about his result

## 2024-03-29 NOTE — Telephone Encounter (Signed)
 Exercise Myoview instructions reviewed with patient informed and verbalized understanding of plan. Letter available in mychart.

## 2024-03-31 NOTE — Written Directive (Cosign Needed)
  PLUVICTO   THERAPY   RADIOPHARMACEUTICAL: Lutetium 177 vipivotide tetraxetan (Pluvicto )     PRESCRIBED DOSE FOR ADMINISTRATION:  200 mCi   ROUTE OFADMINISTRATION:  IV   DIAGNOSIS:  Prostate cancer metastatic to multiple sites, Prostate cancer metastatic to multiple sites    REFERRING PHYSICIAN:Dorsey, John T IV, M    TREATMENT #:2   ADDITIONAL PHYSICIAN COMMENTS/NOTES:   AUTHORIZED USER SIGNATURE & TIME STAMP:  Norleen GORMAN Boxer, MD   04/01/24    8:23 AM

## 2024-04-01 ENCOUNTER — Ambulatory Visit

## 2024-04-01 ENCOUNTER — Encounter (HOSPITAL_COMMUNITY)
Admission: RE | Admit: 2024-04-01 | Discharge: 2024-04-01 | Disposition: A | Discharge status: Home / Self Care | Source: Ambulatory Visit | Attending: Hematology and Oncology | Admitting: Hematology and Oncology

## 2024-04-01 VITALS — BP 131/78 | HR 76 | Temp 98.3°F | Resp 20

## 2024-04-01 DIAGNOSIS — C61 Malignant neoplasm of prostate: Secondary | ICD-10-CM

## 2024-04-01 MED ORDER — DENOSUMAB 120 MG/1.7ML ~~LOC~~ SOLN
120.0000 mg | Freq: Once | SUBCUTANEOUS | Status: AC
  Administered 2024-04-01: 120 mg via SUBCUTANEOUS

## 2024-04-01 MED ORDER — LACTATED RINGERS IV BOLUS
1000.0000 mL | Freq: Once | INTRAVENOUS | Status: AC
  Administered 2024-04-01: 1000 mL via INTRAVENOUS

## 2024-04-01 MED ORDER — LEUPROLIDE ACETATE (4 MONTH) 30 MG ~~LOC~~ KIT
30.0000 mg | PACK | Freq: Once | SUBCUTANEOUS | Status: AC
  Administered 2024-04-01: 30 mg via SUBCUTANEOUS

## 2024-04-01 NOTE — Progress Notes (Signed)
 CLINICAL DATA: [73 year old male with castrate resistant metastatic prostate carcinoma.  Multiple skeletal metastasis PSMA avid PSMA PET scan.]  EXAM: NUCLEAR MEDICINE PLUVICTO  INJECTION  TECHNIQUE: Infusion: The nuclear medicine technologist and I personally verified the dose activity to be delivered as specified in the written directive, and verified the patient identification via 2 separate methods.  Initial flush of the intravenous catheter was performed was sterile saline. The dose syringe was connected to the catheter and the Lu-177 Pluvicto  administered over a 1 to 10 min infusion. Single 10 cc  lushes with normal saline follow the dose. No complications were noted. The entire IV tubing, venocatheter, stopcock and syringes was removed in total, placed in a disposal bag and sent for assay of the residual activity, which will be reported at a later time in our EMR by the physics staff. Pressure was applied to the venipuncture site, and a compression bandage placed. Patient monitored for 1 hour following infusion.    Radiation Safety personnel were present to perform the discharge survey, as detailed on their documentation. After a short period of observation, the patient had his IV removed.  RADIOPHARMACEUTICALS: [203.9] microcuries Lu-177 PLUVICTO   FINDINGS: Current Infusion: [2]  Planned Infusions: 6      Patient presented to nuclear medicine for treatment. The patient's most recent blood counts were reviewed and remains a good candidate to proceed with Lu-177 Pluvicto .     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  Hemoglobin stable at 10.1.  White blood cells decreased slightly in 2.7.  Platelets 146.     Renal function normal.     Interval increase in PSA level =  1213 increased from 716.  Favor flare response to radiotherapy.     Patient reports interval chest pain.  Cardiology evaluation in progress.     The patient was situated in an infusion  suite with a contact barrier placed under the arm. Intravenous access was established, using sterile technique, and a normal saline infusion from a syringe was started.    IMPRESSION: Current Infusion: [2]  Planned Infusions: 6    [The patient tolerated the infusion well. The patient will return in 6 weeks for ongoing care.]

## 2024-04-01 NOTE — Progress Notes (Signed)
 Pt. Tolerated second pluvicto  treatment well.

## 2024-04-01 NOTE — Progress Notes (Signed)
 Ok to proceed with Xgeva  today with Ca=8.6 per Dr. Federico.  Aislynn Cifelli, PharmD, MBA

## 2024-04-02 ENCOUNTER — Other Ambulatory Visit: Payer: Self-pay | Admitting: Hematology and Oncology

## 2024-04-07 ENCOUNTER — Ambulatory Visit (HOSPITAL_COMMUNITY)
Admission: RE | Admit: 2024-04-07 | Discharge: 2024-04-07 | Disposition: A | Discharge status: Home / Self Care | Source: Ambulatory Visit | Attending: Cardiology | Admitting: Cardiology

## 2024-04-07 ENCOUNTER — Other Ambulatory Visit: Payer: Self-pay | Admitting: Physician Assistant

## 2024-04-07 ENCOUNTER — Encounter (HOSPITAL_COMMUNITY)
Admission: RE | Admit: 2024-04-07 | Discharge: 2024-04-07 | Disposition: A | Discharge status: Home / Self Care | Source: Ambulatory Visit | Attending: Physician Assistant | Admitting: Physician Assistant

## 2024-04-07 ENCOUNTER — Encounter (HOSPITAL_COMMUNITY): Payer: Self-pay

## 2024-04-07 DIAGNOSIS — R079 Chest pain, unspecified: Secondary | ICD-10-CM | POA: Insufficient documentation

## 2024-04-07 HISTORY — DX: Malignant (primary) neoplasm, unspecified: C80.1

## 2024-04-07 LAB — NM MYOCAR MULTI W/SPECT W/WALL MOTION / EF
Angina Index: 0
Base ST Depression (mm): 0 mm
Duke Treadmill Score: 4
Estimated workload: 6
Exercise duration (min): 3 min
Exercise duration (sec): 37 s
LV dias vol: 151 mL (ref 62–150)
LV sys vol: 92 mL (ref 4.2–5.8)
MPHR: 146 {beats}/min
Nuc Stress EF: 39 %
Peak HR: 136 {beats}/min
Percent HR: 93 %
RATE: 0.4
RPE: 16
Rest HR: 71 {beats}/min
Rest Nuclear Isotope Dose: 10.1 mCi
SDS: 1
SRS: 14
SSS: 15
ST Depression (mm): 0 mm
Stress Nuclear Isotope Dose: 32 mCi
TID: 0.84

## 2024-04-07 MED ORDER — SODIUM CHLORIDE FLUSH 0.9 % IV SOLN
INTRAVENOUS | Status: AC
  Administered 2024-04-07: 10 mL via INTRAVENOUS
  Filled 2024-04-07: qty 10

## 2024-04-07 MED ORDER — REGADENOSON 0.4 MG/5ML IV SOLN
INTRAVENOUS | Status: AC
  Filled 2024-04-07: qty 5

## 2024-04-07 MED ORDER — TECHNETIUM TC 99M TETROFOSMIN IV KIT
30.0000 | PACK | Freq: Once | INTRAVENOUS | Status: AC | PRN
  Administered 2024-04-07: 32 via INTRAVENOUS

## 2024-04-07 MED ORDER — TECHNETIUM TC 99M TETROFOSMIN IV KIT
10.0000 | PACK | Freq: Once | INTRAVENOUS | Status: AC | PRN
  Administered 2024-04-07: 10.1 via INTRAVENOUS

## 2024-04-07 NOTE — Progress Notes (Signed)
     Erik Page presented for a Exercise Myoview  nuclear stress test today.  I Lorette CINDERELLA Kapur, PA-C, provided direct supervision and was present during the stress portion of the study today, which was completed without significant symptoms, immediate complications, or acute ST/T changes on ECG.  Stress imaging is pending at this time.  Preliminary ECG findings may be listed in the chart, but the stress test result will not be finalized until perfusion imaging is complete.  Lorette CINDERELLA Kapur, PA-C  04/07/2024, 11:35 AM

## 2024-04-08 ENCOUNTER — Ambulatory Visit: Payer: Self-pay | Admitting: Physician Assistant

## 2024-04-11 ENCOUNTER — Other Ambulatory Visit: Payer: Self-pay | Admitting: Hematology and Oncology

## 2024-04-12 ENCOUNTER — Other Ambulatory Visit: Payer: Self-pay | Admitting: Hematology and Oncology

## 2024-04-12 ENCOUNTER — Other Ambulatory Visit: Payer: Self-pay | Admitting: Physician Assistant

## 2024-04-12 ENCOUNTER — Encounter: Payer: Self-pay | Admitting: Hematology and Oncology

## 2024-04-12 DIAGNOSIS — C61 Malignant neoplasm of prostate: Secondary | ICD-10-CM

## 2024-04-12 DIAGNOSIS — R079 Chest pain, unspecified: Secondary | ICD-10-CM

## 2024-04-12 DIAGNOSIS — R931 Abnormal findings on diagnostic imaging of heart and coronary circulation: Secondary | ICD-10-CM

## 2024-04-28 ENCOUNTER — Ambulatory Visit (HOSPITAL_COMMUNITY)
Admission: RE | Admit: 2024-04-28 | Discharge: 2024-04-28 | Disposition: A | Discharge status: Home / Self Care | Source: Ambulatory Visit | Attending: Physician Assistant | Admitting: Physician Assistant

## 2024-04-28 DIAGNOSIS — I2510 Atherosclerotic heart disease of native coronary artery without angina pectoris: Secondary | ICD-10-CM

## 2024-04-28 DIAGNOSIS — I10 Essential (primary) hypertension: Secondary | ICD-10-CM | POA: Diagnosis not present

## 2024-04-28 DIAGNOSIS — R931 Abnormal findings on diagnostic imaging of heart and coronary circulation: Secondary | ICD-10-CM | POA: Diagnosis present

## 2024-04-28 DIAGNOSIS — R079 Chest pain, unspecified: Secondary | ICD-10-CM | POA: Diagnosis present

## 2024-04-28 LAB — ECHOCARDIOGRAM COMPLETE
AR max vel: 4.56 cm2
AV Area VTI: 4.39 cm2
AV Area mean vel: 4.17 cm2
AV Mean grad: 2.6 mmHg
AV Peak grad: 4.6 mmHg
Ao pk vel: 1.07 m/s
Area-P 1/2: 3.6 cm2
Calc EF: 49.7 %
S' Lateral: 4.3 cm
Single Plane A2C EF: 52.2 %
Single Plane A4C EF: 48.3 %

## 2024-04-28 NOTE — Progress Notes (Signed)
  Echocardiogram 2D Echocardiogram has been performed.  Devora Ellouise SAUNDERS 04/28/2024, 10:23 AM

## 2024-05-04 ENCOUNTER — Telehealth: Payer: Self-pay | Admitting: Physician Assistant

## 2024-05-04 ENCOUNTER — Ambulatory Visit: Payer: Self-pay | Admitting: Physician Assistant

## 2024-05-04 NOTE — Telephone Encounter (Signed)
 Contacted and advised that echo has not been resulted yet but message would be sent to provider.

## 2024-05-04 NOTE — Telephone Encounter (Signed)
 Pt requesting a c/b regarding Echo results. Please advise

## 2024-05-06 ENCOUNTER — Inpatient Hospital Stay: Attending: Hematology and Oncology | Admitting: Hematology and Oncology

## 2024-05-06 ENCOUNTER — Other Ambulatory Visit: Payer: Self-pay | Admitting: Hematology and Oncology

## 2024-05-06 ENCOUNTER — Inpatient Hospital Stay

## 2024-05-06 ENCOUNTER — Other Ambulatory Visit

## 2024-05-06 VITALS — BP 111/64 | HR 73 | Temp 98.6°F | Resp 16 | Ht 73.0 in | Wt 228.2 lb

## 2024-05-06 DIAGNOSIS — C61 Malignant neoplasm of prostate: Secondary | ICD-10-CM | POA: Insufficient documentation

## 2024-05-06 DIAGNOSIS — R7989 Other specified abnormal findings of blood chemistry: Secondary | ICD-10-CM

## 2024-05-06 DIAGNOSIS — C7951 Secondary malignant neoplasm of bone: Secondary | ICD-10-CM | POA: Diagnosis present

## 2024-05-06 DIAGNOSIS — Z7952 Long term (current) use of systemic steroids: Secondary | ICD-10-CM | POA: Insufficient documentation

## 2024-05-06 DIAGNOSIS — E876 Hypokalemia: Secondary | ICD-10-CM | POA: Diagnosis not present

## 2024-05-06 DIAGNOSIS — E349 Endocrine disorder, unspecified: Secondary | ICD-10-CM

## 2024-05-06 DIAGNOSIS — R200 Anesthesia of skin: Secondary | ICD-10-CM | POA: Insufficient documentation

## 2024-05-06 DIAGNOSIS — D649 Anemia, unspecified: Secondary | ICD-10-CM | POA: Diagnosis not present

## 2024-05-06 LAB — CBC WITH DIFFERENTIAL (CANCER CENTER ONLY)
Abs Immature Granulocytes: 0.01 K/uL (ref 0.00–0.07)
Basophils Absolute: 0 K/uL (ref 0.0–0.1)
Basophils Relative: 1 %
Eosinophils Absolute: 0.1 K/uL (ref 0.0–0.5)
Eosinophils Relative: 3 %
HCT: 32.9 % — ABNORMAL LOW (ref 39.0–52.0)
Hemoglobin: 10.7 g/dL — ABNORMAL LOW (ref 13.0–17.0)
Immature Granulocytes: 0 %
Lymphocytes Relative: 24 %
Lymphs Abs: 0.6 K/uL — ABNORMAL LOW (ref 0.7–4.0)
MCH: 29.6 pg (ref 26.0–34.0)
MCHC: 32.5 g/dL (ref 30.0–36.0)
MCV: 91.1 fL (ref 80.0–100.0)
Monocytes Absolute: 0.2 K/uL (ref 0.1–1.0)
Monocytes Relative: 9 %
Neutro Abs: 1.5 K/uL — ABNORMAL LOW (ref 1.7–7.7)
Neutrophils Relative %: 63 %
Platelet Count: 149 K/uL — ABNORMAL LOW (ref 150–400)
RBC: 3.61 MIL/uL — ABNORMAL LOW (ref 4.22–5.81)
RDW: 15.9 % — ABNORMAL HIGH (ref 11.5–15.5)
WBC Count: 2.4 K/uL — ABNORMAL LOW (ref 4.0–10.5)
nRBC: 0 % (ref 0.0–0.2)

## 2024-05-06 LAB — CMP (CANCER CENTER ONLY)
ALT: 12 U/L (ref 0–44)
AST: 13 U/L — ABNORMAL LOW (ref 15–41)
Albumin: 4.1 g/dL (ref 3.5–5.0)
Alkaline Phosphatase: 382 U/L — ABNORMAL HIGH (ref 38–126)
Anion gap: 6 (ref 5–15)
BUN: 16 mg/dL (ref 8–23)
CO2: 27 mmol/L (ref 22–32)
Calcium: 8.1 mg/dL — ABNORMAL LOW (ref 8.9–10.3)
Chloride: 106 mmol/L (ref 98–111)
Creatinine: 0.74 mg/dL (ref 0.61–1.24)
GFR, Estimated: >60 mL/min (ref >60)
Glucose, Bld: 106 mg/dL — ABNORMAL HIGH (ref 70–99)
Potassium: 4.4 mmol/L (ref 3.5–5.1)
Sodium: 139 mmol/L (ref 135–145)
Total Bilirubin: 1.3 mg/dL — ABNORMAL HIGH (ref 0.0–1.2)
Total Protein: 6.4 g/dL — ABNORMAL LOW (ref 6.5–8.1)

## 2024-05-06 NOTE — Progress Notes (Signed)
 [facility] [phone]   [phone]  PROGRESS NOTE  Patient Care Team: Henriette Anes, DO as PCP - General (Family Medicine) Alvan Dorn FALCON, MD as PCP - Cardiology (Cardiology)  Hematological/Oncological History # Metastatic Castrate Sensitive Prostate Cancer  09/30/2021: percutaneous nephrostomy tube placement and retroperitoneal lymph node biopsy  10/01/2021: Firmagon  240 mg  09/2021: radiation therapy to the lumbar spine after receiving 30 Gray in 10 fractions  11/01/2021: started Eligard  30 mg every 4 months and Zytiga  1000 mg daily with prednisone  5 mg daily  09/25/2022: last visit with Dr. Amadeo 10/29/2022: transition care to Dr. Federico. Insurance no longer assisting with Zytiga  1000 mg daily, converting enzalutamide  160 mg PO daily.   08/22/2023: PET scan shows progression 10/28/2023: cycle 1, day 1  of docetaxel  01/19/2024: Discontinue docetaxel  chemotherapy prior to cycle 5 as patient is not able to tolerate treatment.  02/25/2024: Dose 1 of Pluvicto  therapy.   Interval History:  Erik Page 73 y.o. male with medical history significant for [facility] presents for a follow up visit. The patient's last visit was on 02/09/2024. He presents today to prior to the start of Cycle 2 of Pluvicto .   On exam today Erik Page reports he has been well overall in the interim since our last visit.  He reports that he does have some trouble with the high temperatures outside with his Pluvicto  therapy and he is having some numbness of his fingers and toes.  He reports he does have some trouble with buttons but is able to complete his day-to-day tasks, albeit slower.  He reports that he feels like he has been losing weight but he is only down 1 pound from the early part of July.  He notes that he does his best to try to eat well and is also try to be more active with walking and exercise.  He notes no other major side effects as result of his Pluvicto  treatment.  He is not  currently having any pain anywhere and has not developed any new symptoms in the interim since our last visit.  Otherwise he has been well and has no questions concerns or complaints today.  A full 10 point ROS is otherwise negative.  He is willing and able to continue with Pluvicto .   MEDICAL HISTORY:  Past Medical History:  Diagnosis Date   Cancer [facility])    Prostate   HTN (hypertension)    Myocardial infarction [facility])     SURGICAL HISTORY: Past Surgical History:  Procedure Laterality Date   CARDIAC CATHETERIZATION N/A 08/15/2015   Procedure: Left Heart Cath and Coronary Angiography;  Surgeon: Alm LELON Clay, MD;  Location: [facility] INVASIVE CV LAB;  Service: Cardiovascular;  Laterality: N/A;   CARDIAC CATHETERIZATION N/A 08/15/2015   Procedure: Left Heart Cath and Coronary Angiography;  Surgeon: Alm LELON Clay, MD;  Location[facility] INVASIVE CV LAB;  Service: Cardiovascular;  Laterality: N/A;   CARDIAC CATHETERIZATION N/A 08/15/2015   Procedure: Coronary Stent Intervention;  Surgeon: Alm LELON Clay, MD;  Location: [facility] INVASIVE CV LAB;  Service: Cardiovascular;  Laterality: N/A;   IR CONVERT RIGHT NEPHROSTOMY TO NEPHROURETERAL CATH  11/12/2021   IR EXT NEPHROURETERAL CATH EXCHANGE  01/07/2022   IR NEPHROSTOMY EXCHANGE RIGHT  02/20/2022   IR NEPHROSTOMY PLACEMENT RIGHT  10/01/2021   TONSILLECTOMY      SOCIAL HISTORY: Social History   Socioeconomic History   Marital status: Married    Spouse name: Not on file   Number of children:  Not on file   Years of education: Not on file   Highest education level: Not on file  Occupational History   Not on file  Tobacco Use   Smoking status: Never   Smokeless tobacco: Never  Vaping Use   Vaping status: Never Used  Substance and Sexual Activity   Alcohol use: No    Alcohol/week: 0.0 standard drinks of alcohol   Drug use: No   Sexual activity: Not on file  Other Topics Concern   Not on file  Social History Narrative   Works as a Pensions consultant;  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  MEDICATIONS:  Current Outpatient Medications  Medication Sig Dispense Refill   aspirin  EC 81 MG EC tablet Take 1 tablet (81 mg total) by mouth daily.     atorvastatin  (LIPITOR ) 80 MG tablet TAKE 1 TABLET BY MOUTH EVERY DAY 90 tablet 1   calcium -vitamin D (OSCAL WITH D) 500-5 MG-MCG tablet Take 1 tablet by mouth 2 (two) times daily. 90 tablet 3   feeding supplement (ENSURE ENLIVE / ENSURE PLUS) LIQD Take 237 mLs by mouth 2 (two) times daily between meals. 237 mL 12   furosemide  (LASIX ) 20 MG tablet TAKE 1 TABLET (20 MG TOTAL) BY MOUTH DAILY. AS NEEDED FOR SWELLING/EDEMA IN LEGS 90 tablet 1   ibuprofen  (ADVIL ) 800 MG tablet TAKE 1 TABLET BY MOUTH THREE TIMES A DAY 90 tablet 1   isosorbide  mononitrate (IMDUR ) 30 MG 24 hr tablet Take 1 tablet (30 mg total) by mouth daily. 90 tablet 3   metoprolol  tartrate (LOPRESSOR ) 25 MG tablet TAKE 1 TABLET BY MOUTH TWICE A DAY 180 tablet 1   nitroGLYCERIN  (NITROSTAT ) 0.4 MG SL tablet DISSOLVE 1 TAB UNDER TONGUE FOR CHEST PAIN - IF PAIN REMAINS AFTER 5 MIN, CALL 911 AND REPEAT DOSE. MAX 3 TABS IN 15 MINUTES 25 tablet 3   ondansetron  (ZOFRAN ) 8 MG tablet TAKE 1 TABLET BY MOUTH EVERY 8 HOURS AS NEEDED 30 tablet 0   oxyCODONE  (OXY IR/ROXICODONE ) 5 MG immediate release tablet Take 2 tablets (10 mg total) by mouth every 4 (four) hours as needed for severe pain (pain score 7-10). 120 tablet 0   potassium chloride  SA (KLOR-CON  M) 20 MEQ tablet TAKE 1 TABLET (20 MEQ TOTAL) BY MOUTH DAILY. WHEN TAKING LASIX  90 tablet 1   prochlorperazine  (COMPAZINE )  10 MG tablet Take 1 tablet (10 mg total) by mouth every 6 (six) hours as needed for nausea or vomiting. 30 tablet 0   senna-docusate (SENOKOT-S) 8.6-Page MG tablet Take 1 tablet by mouth at bedtime as needed for mild constipation. 30 tablet 0   No current facility-administered medications for this visit.    REVIEW OF SYSTEMS:   Constitutional: ( - ) fevers, ( - )  chills , ( - ) night sweats Eyes: ( - ) blurriness of vision, ( - ) double vision, ( - ) watery eyes Ears, nose, mouth, throat, and face: ( - ) mucositis, ( - ) sore throat Respiratory: ( - ) cough, ( - )  dyspnea, ( - ) wheezes Cardiovascular: ( - ) palpitation, ( - ) chest discomfort, ( - ) lower extremity swelling Gastrointestinal:  ( - ) nausea, ( - ) heartburn, ( - ) change in bowel habits Skin: ( - ) abnormal skin rashes Lymphatics: ( - ) new lymphadenopathy, ( - ) easy bruising Neurological: ( - ) numbness, ( - ) tingling, ( - ) new weaknesses Behavioral/Psych: ( - ) mood change, ( - ) new changes  All other systems were reviewed with the patient and are negative.  PHYSICAL EXAMINATION: ECOG PERFORMANCE STATUS: 0 - Asymptomatic  Vitals:   05/06/24 0829  BP: 111/64  Pulse: 73  Resp: 16  Temp: 98.6 F (37 C)  SpO2: 100%      Filed Weights   05/06/24 0829  Weight: 228 lb 3.2 oz (103.5 kg)      GENERAL: Well-appearing elderly African-American male, alert, no distress and comfortable SKIN: skin color, texture, turgor are normal, no rashes or significant lesions EYES: conjunctiva are pink and non-injected, sclera clear LUNGS: clear to auscultation and percussion with normal breathing effort HEART: regular rate & rhythm and no murmurs and no lower extremity edema Musculoskeletal: no cyanosis of digits and no clubbing  PSYCH: alert & oriented x 3, fluent speech NEURO: no focal motor/sensory deficits  LABORATORY DATA:  I have reviewed the data as listed    Latest Ref Rng & Units 05/06/2024    8:14 AM 03/25/2024     9:15 AM 02/19/2024    8:42 AM  CBC  WBC 4.0 - 10.5 K/uL 2.4  2.7  3.3   Hemoglobin 13.0 - 17.0 g/dL 89.2  89.8  8.9   Hematocrit 39.0 - 52.0 % 32.9  30.6  27.9   Platelets 150 - 400 K/uL 149  146  224        Latest Ref Rng & Units 05/06/2024    8:14 AM 03/25/2024    9:15 AM 02/19/2024    8:42 AM  CMP  Glucose 70 - 99 mg/dL 893  874  97   BUN 8 - 23 mg/dL 16  15  17    Creatinine 0.61 - 1.24 mg/dL 9.25  9.21  9.09   Sodium 135 - 145 mmol/L 139  140  142   Potassium 3.5 - 5.1 mmol/L 4.4  4.2  4.4   Chloride 98 - 111 mmol/L 106  107  106   CO2 22 - 32 mmol/L 27  29  30    Calcium  8.9 - 10.3 mg/dL 8.1  8.6  8.8   Total Protein 6.5 - 8.1 g/dL 6.4  6.4  6.4   Total Bilirubin 0.0 - 1.2 mg/dL 1.3  1.1  0.7   Alkaline Phos 38 - 126 U/L 382  269  150   AST 15 - 41 U/L 13  12  10    ALT 0 - 44 U/L 12  11  8      RADIOGRAPHIC STUDIES: ECHOCARDIOGRAM COMPLETE Result Date: 04/28/2024    ECHOCARDIOGRAM REPORT   Patient Name:   ALFONZA Sneeringer Date of Exam: 04/28/2024 Medical Rec #:  [id number]   Height:       73.0 in Accession #:    [id number]  Weight:       231.0 lb Date of Birth:  May 25, 1951   BSA:          2.288 m Patient Age:    73 years    BP:  117/73 mmHg Patient Gender: M           HR:           78 bpm. Exam Location:  [facility] Procedure: 2D Echo, 3D Echo, Cardiac Doppler, Color Doppler and Strain Analysis            (Both Spectral and Color Flow Doppler were utilized during            procedure). Indications:    R07.9* Chest pain, unspecified. Abnormal nuclear cardiac imaging                 test. Decreased LV function  History:        Patient has prior history of Echocardiogram examinations, most                 recent 08/16/2015. Previous Myocardial Infarction and CAD,                 Arrythmias:Atrial Fibrillation, Signs/Symptoms:Chest Pain; Risk                 Factors:Hypertension. Cancer. Wall motion abnormality.  Sonographer:    Ellouise Mose RDCS Referring Phys: [id number] LORETTE GRADE DUNLAP  IMPRESSIONS  1. Left ventricular ejection fraction, by estimation, is 45 to Page%. Left ventricular ejection fraction by 3D volume is 47 %. Left ventricular ejection fraction by 2D MOD biplane is 49.7 %. Left ventricular ejection fraction by PLAX is 44 %. The left ventricle has mildly decreased function. The left ventricle demonstrates global hypokinesis. There is mild left ventricular hypertrophy. Left ventricular diastolic parameters are consistent with Grade I diastolic dysfunction (impaired relaxation). The average left ventricular global longitudinal strain is -18.0 %. The global longitudinal strain is normal.  2. Right ventricular systolic function is normal. The right ventricular size is normal. Tricuspid regurgitation signal is inadequate for assessing PA pressure.  3. The mitral valve is normal in structure. Trivial mitral valve regurgitation. No evidence of mitral stenosis.  4. The aortic valve is tricuspid. There is mild calcification of the aortic valve. There is mild thickening of the aortic valve. Aortic valve regurgitation is not visualized. No aortic stenosis is present.  5. Aortic dilatation noted. There is mild dilatation of the ascending aorta, measuring 40 mm. There is Moderate (Grade III) atheroma plaque involving the aortic arch.  6. The inferior vena cava is dilated in size with >Page% respiratory variability, suggesting right atrial pressure of 8 mmHg. FINDINGS  Left Ventricle: Left ventricular ejection fraction, by estimation, is 45 to Page%. Left ventricular ejection fraction by PLAX is 44 %. Left ventricular ejection fraction by 2D MOD biplane is 49.7 %. Left ventricular ejection fraction by 3D volume is 47 %.  The left ventricle has mildly decreased function. The left ventricle demonstrates global hypokinesis. The average left ventricular global longitudinal strain is -18.0 %. Strain was performed and the global longitudinal strain is normal. The left ventricular internal cavity size was normal  in size. There is mild left ventricular hypertrophy. Left ventricular diastolic parameters are consistent with Grade I diastolic dysfunction (impaired relaxation). Normal left ventricular filling pressure. Right Ventricle: The right ventricular size is normal. No increase in right ventricular wall thickness. Right ventricular systolic function is normal. Tricuspid regurgitation signal is inadequate for assessing PA pressure. Left Atrium: Left atrial size was normal in size. Right Atrium: Right atrial size was normal in size. Pericardium: There is no evidence of pericardial effusion. Mitral Valve: The mitral valve is normal in structure. Trivial mitral  valve regurgitation. No evidence of mitral valve stenosis. Tricuspid Valve: The tricuspid valve is normal in structure. Tricuspid valve regurgitation is trivial. No evidence of tricuspid stenosis. Aortic Valve: The aortic valve is tricuspid. There is mild calcification of the aortic valve. There is mild thickening of the aortic valve. There is mild aortic valve annular calcification. Aortic valve regurgitation is not visualized. No aortic stenosis  is present. Aortic valve mean gradient measures 2.6 mmHg. Aortic valve peak gradient measures 4.6 mmHg. Aortic valve area, by VTI measures 4.39 cm. Pulmonic Valve: The pulmonic valve was not well visualized. Pulmonic valve regurgitation is not visualized. No evidence of pulmonic stenosis. Aorta: Aortic dilatation noted and the aortic root is normal in size and structure. There is mild dilatation of the ascending aorta, measuring 40 mm. There is moderate (Grade III) atheroma plaque involving the aortic arch. Venous: The inferior vena cava is dilated in size with greater than Page% respiratory variability, suggesting right atrial pressure of 8 mmHg. IAS/Shunts: No atrial level shunt detected by color flow Doppler. Additional Comments: 3D was performed not requiring image post processing on an independent workstation and was  abnormal.  LEFT VENTRICLE PLAX 2D                        Biplane EF (MOD) LV EF:         Left            LV Biplane EF:   Left                ventricular                      ventricular                ejection                         ejection                fraction by                      fraction by                PLAX is 44                       2D MOD                %.                               biplane is LVIDd:         5.Page cm                          49.7 %. LVIDs:         4.30 cm LV PW:         1.10 cm         Diastology LV IVS:        1.30 cm         LV e' medial:    7.62 cm/s LVOT diam:     2.Page cm         LV E/e' medial:  11.7 LV SV:         103  LV e' lateral:   11.10 cm/s LV SV Index:   45              LV E/e' lateral: 8.0 LVOT Area:     4.91 cm                                2D Longitudinal                                Strain LV Volumes (MOD)               2D Strain GLS   -18.0 % LV vol d, MOD    156.0 ml      Avg: A2C: LV vol d, MOD    160.0 ml      3D Volume EF A4C:                           LV 3D EF:    Left LV vol s, MOD    74.5 ml                    ventricul A2C:                                        ar LV vol s, MOD    82.8 ml                    ejection A4C:                                        fraction LV SV MOD A2C:   81.5 ml                    by 3D LV SV MOD A4C:   160.0 ml                   volume is LV SV MOD BP:    78.6 ml                    47 %.                                 3D Volume EF:                                3D EF:        47 % RIGHT VENTRICLE             IVC RV S prime:     11.Page cm/s  IVC diam: 2.60 cm TAPSE (M-mode): 2.2 cm LEFT ATRIUM             Index        RIGHT ATRIUM           Index LA diam:        3.90 cm 1.70 cm/m   RA Area:     18.00 cm LA Vol (A2C):   38.1 ml 16.66 ml/m  RA  Volume:   54.80 ml  23.96 ml/m LA Vol (A4C):   63.1 ml 27.58 ml/m LA Biplane Vol: 48.2 ml 21.07 ml/m  AORTIC VALVE AV Area (Vmax):    4.56 cm AV Area (Vmean):    4.17 cm AV Area (VTI):     4.39 cm AV Vmax:           107.25 cm/s AV Vmean:          75.392 cm/s AV VTI:            0.234 m AV Peak Grad:      4.6 mmHg AV Mean Grad:      2.6 mmHg LVOT Vmax:         99.60 cm/s LVOT Vmean:        64.000 cm/s LVOT VTI:          0.209 m LVOT/AV VTI ratio: 0.89  AORTA Ao Root diam: 3.90 cm Ao Asc diam:  4.00 cm MITRAL VALVE MV Area (PHT): 3.60 cm    SHUNTS MV Decel Time: 211 msec    Systemic VTI:  0.21 m MV E velocity: 88.80 cm/s  Systemic Diam: 2.Page cm MV A velocity: 88.30 cm/s MV E/A ratio:  1.01 Dorn Ross MD Electronically signed by Dorn Ross MD Signature Date/Time: 04/28/2024/11:18:58 AM    Final    NM Myocar Multi W/Spect Marisela Motion / EF Result Date: 04/07/2024   Findings are consistent with large prior inferior/inferolatera/lateral infarction. There is no current ischemia. The study is intermediate risk. Risk based on extensive scar and decreased LVEF, consider correlating LVEF with echocardiogram.   No ST deviation was noted.   LV perfusion is abnormal. Large severe intensity extensive inferior/inferolateral/lateral defect that is fixed consistent with prior infarction.   Left ventricular function is abnormal. Nuclear stress EF: 39%. The left ventricular ejection fraction is moderately decreased (30-44%). End diastolic cavity size is moderately enlarged.     ASSESSMENT & PLAN Erik Page is a 73 y.o. male with medical history significant for [facility] presents for a follow up visit.   # Metastatic Castrate Sensitive Prostate Cancer  --continue lupron  30mg  subq q 4 months indefinitely  --started Eligard  30 mg every 4 months and Zytiga  1000 mg daily with prednisone  5 mg daily on 11/01/2021:   --transition from Zytiga  to enzalutamide  160mg  PO daily due to cost. Was able to get approval to return to Zytiga  due to faillure of enzalutamide .  --PET scan from 08/22/2023 showed progression with multiple new and progressive bone lesions.  --treatment 1 of Pluvicto   administered on 02/25/2024.  PLAN: --Due for cycle 3 of Pluvicto  next week.  --After discussion of treatment options moving forward the patient noted that he would like to pursue Pluvicto  therapy.  Discussed risks and benefits. --Labs today show white blood cell count 2.7, Hgb 10.1, MCV 93, Plt 146. Cr 0.74 -- Last PSA 1213.0  --Will administer Xgeva  q 2 months, last one was 04/01/2024. Next due in Sept 2025.  --Will administer Lupron  every 4 months, last one was on 04/01/2024 so next one will be due around Nov 2025  -- RTC prior to the start of cycle 4 of Pluvicto . Interval Lupron  and Xgeva  shots.   #Hypokalemia: --Potassium level is 4.4  --Advised to incorporate potassium rich foods into his diet.    No orders of the defined types were placed in this encounter.   All questions were answered. The patient knows to call the clinic with any problems, questions or concerns.  A total of more than 30 minutes were spent on this encounter with face-to-face time and non-face-to-face time, including preparing to see the patient, ordering tests and/or medications, counseling the patient and coordination of care as outlined above.   Norleen IVAR Kidney, MD Department of Hematology/Oncology [facility] at [facility] Phone: [phone] Pager: [phone] Email: [email or web address]   05/06/2024 10:45 AM

## 2024-05-07 ENCOUNTER — Ambulatory Visit: Payer: Self-pay

## 2024-05-07 LAB — PROSTATE-SPECIFIC AG, SERUM (LABCORP): Prostate Specific Ag, Serum: 959 ng/mL — ABNORMAL HIGH (ref 0.0–4.0)

## 2024-05-07 NOTE — Telephone Encounter (Signed)
 Called pt to give results per MD. He was elated and very thankful for his care and is agreeable with the plan. He knows to call with any concerns.

## 2024-05-07 NOTE — Telephone Encounter (Signed)
-----   Message from Norleen ONEIDA Kidney IV sent at 05/07/2024  8:52 AM EDT ----- Please let [name] know that his PSA has dropped.  It was over 1200 and is now at 959.  We will continue the Pluvicto  treatments as scheduled.  ----- Message ----- From: Rebecka, Lab In [place] Sent: 05/06/2024   8:29 AM EDT To: Norleen ONEIDA Kidney MADISON, MD

## 2024-05-12 NOTE — Written Directive (Cosign Needed)
  PLUVICTO   THERAPY   RADIOPHARMACEUTICAL: Lutetium 177 vipivotide tetraxetan (Pluvicto )     PRESCRIBED DOSE FOR ADMINISTRATION:  200 mCi   ROUTE OFADMINISTRATION:  IV   DIAGNOSIS:   Prostate cancer metastatic to multiple sites  Dx: Prostate cancer metastatic to multiple sites [facility]) [place] (ICD-10-CM)]     REFERRING PHYSICIAN:  Federico Norleen ONEIDA MADISON, MD    TREATMENT #:3   ADDITIONAL PHYSICIAN COMMENTS/NOTES:   AUTHORIZED USER SIGNATURE & TIME STAMP: Norleen GORMAN Boxer, MD   05/13/24    8:12 AM

## 2024-05-13 ENCOUNTER — Ambulatory Visit (HOSPITAL_COMMUNITY)
Admission: RE | Admit: 2024-05-13 | Discharge: 2024-05-13 | Disposition: A | Discharge status: Home / Self Care | Source: Ambulatory Visit | Attending: Hematology and Oncology | Admitting: Hematology and Oncology

## 2024-05-13 DIAGNOSIS — C61 Malignant neoplasm of prostate: Secondary | ICD-10-CM | POA: Diagnosis present

## 2024-05-13 MED ORDER — LUTETIUM LU 177 VIPIVOTIDE TET 1000 MBQ/ML IV SOLN
201.7720 | Freq: Once | INTRAVENOUS | Status: AC
  Administered 2024-05-13: 201.772 via INTRAVENOUS

## 2024-05-13 MED ORDER — SODIUM CHLORIDE 0.9 % IV BOLUS
1000.0000 mL | Freq: Once | INTRAVENOUS | Status: AC
  Administered 2024-05-13: 1000 mL via INTRAVENOUS

## 2024-05-13 NOTE — Progress Notes (Signed)
 CLINICAL DATA: [73 year old male with castrate resistant prostate cancer.]  EXAM: NUCLEAR MEDICINE PLUVICTO  INJECTION  TECHNIQUE: Infusion: The nuclear medicine technologist and I personally verified the dose activity to be delivered as specified in the written directive, and verified the patient identification via 2 separate methods.  Initial flush of the intravenous catheter was performed was sterile saline. The dose syringe was connected to the catheter and the Lu-177 Pluvicto  administered over a 1 to 10 min infusion. Single 10 cc  lushes with normal saline follow the dose. No complications were noted. The entire IV tubing, venocatheter, stopcock and syringes was removed in total, placed in a disposal bag and sent for assay of the residual activity, which will be reported at a later time in our EMR by the physics staff. Pressure was applied to the venipuncture site, and a compression bandage placed. Patient monitored for 1 hour following infusion.    Radiation Safety personnel were present to perform the discharge survey, as detailed on their documentation. After a short period of observation, the patient had his IV removed.  RADIOPHARMACEUTICALS: [201.7] microcuries Lu-177 PLUVICTO   FINDINGS: Current Infusion: [3]  Planned Infusions: 6    Patient presented to nuclear medicine for treatment. The patient's most recent blood counts were reviewed and remains a good candidate to proceed with Lu-177 Pluvicto .    PSA = 959  (05/06/24)     1213 (03/25/24)    716 (02/09/24)    Mild myelo      The patient was situated in an infusion suite with a contact barrier placed under the arm. Intravenous access was established, using sterile technique, and a normal saline infusion from a syringe was started.     Micro-dosimetry: The prescribed radiation activity was assayed and confirmed to be within specified tolerance.  IMPRESSION: Current Infusion: [3]  Planned Infusions:  6    [The patient tolerated the infusion well. The patient will return in 6 weeks  for ongoing care.]

## 2024-05-13 NOTE — Progress Notes (Signed)
 Pt. Tolerated pluvicto  #3 well.

## 2024-06-01 ENCOUNTER — Encounter: Payer: Self-pay | Admitting: *Deleted

## 2024-06-03 ENCOUNTER — Encounter: Payer: Self-pay | Admitting: Cardiology

## 2024-06-03 ENCOUNTER — Ambulatory Visit: Attending: Cardiology | Admitting: Cardiology

## 2024-06-03 VITALS — BP 108/70 | HR 80 | Ht 74.0 in | Wt 232.0 lb

## 2024-06-03 DIAGNOSIS — E782 Mixed hyperlipidemia: Secondary | ICD-10-CM | POA: Diagnosis present

## 2024-06-03 DIAGNOSIS — I25118 Atherosclerotic heart disease of native coronary artery with other forms of angina pectoris: Secondary | ICD-10-CM | POA: Insufficient documentation

## 2024-06-03 MED ORDER — METOPROLOL SUCCINATE ER 25 MG PO TB24: 25.0000 mg | ORAL_TABLET | Freq: Every day | ORAL | 2 refills | Status: AC

## 2024-06-03 NOTE — Patient Instructions (Addendum)
 Medication Instructions:  Your physician has recommended you make the following change in your medication:  Stop taking Lopressor   Start taking Metoprolol  Succinate 25 mg once daily  Continue taking all other medications as prescribed  Labwork: Fasting Lipid panel when he gets his other Labs completed at the Cancer Center   Testing/Procedures: None  Follow-Up: Your physician recommends that you schedule a follow-up appointment in: 6 months  Any Other Special Instructions Will Be Listed Below (If Applicable).  Please call our office or send us  a MyChart message regarding whether you are still taking Losartan  or not.  Thank you for choosing [facility]!     If you need a refill on your cardiac medications before your next appointment, please call your pharmacy.

## 2024-06-03 NOTE — Progress Notes (Signed)
 Clinical Summary Mr. Goddard is a 73 y.o.male seen today for follow up of the following medical problems.      1. CAD - admit 07/2015 with NSTEMI. Underwent PCI of prox LCX, OM2, and OM3 due to complex bifurcation lesion (3 DES stents total).   - postcath had recurrent chest pain, had relook cath which was stable.    07/2015 echo: LVEF 55-60%, hypokinesis of lateral and inferolateral wall.   - 11/2015 nuclear stress test with evidence of scar, no current ischemia - we changed lisinopril  to losartan  due to cough      - seen by PA Sheron 03/2024 with atypical chest pain -  he was started on imdur  - 03/2024 nuclear stress: prior inferior/inferolateral/lateral infaction, no current ischemia. Similar scar to 2017 study.  - 04/2024 echo: LVEF 45-50%, grade I dd, normal RV - chest pains have improved - he started the imdur  at 15mg , reports low bp's on even low dose and has     2. Prostate cancer - followed by oncology  3. 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    Past Medical History:  Diagnosis Date   Cancer (HCC)    Prostate   HTN (hypertension)    Myocardial infarction (HCC)      No Known Allergies   Current Outpatient Medications  Medication Sig Dispense Refill   aspirin  EC 81 MG EC tablet Take 1 tablet (81 mg total) by mouth daily.     atorvastatin  (LIPITOR ) 80 MG tablet TAKE 1 TABLET BY MOUTH EVERY DAY 90 tablet 1   calcium -vitamin D (OSCAL WITH D) 500-5 MG-MCG tablet Take 1 tablet by mouth 2 (two) times daily. 90 tablet 3   feeding supplement (ENSURE ENLIVE / ENSURE PLUS) LIQD Take 237 mLs by mouth 2 (two) times daily between meals. 237 mL 12   furosemide  (LASIX ) 20 MG tablet TAKE 1 TABLET (20 MG TOTAL) BY MOUTH DAILY. AS NEEDED FOR SWELLING/EDEMA IN LEGS 90 tablet 1   ibuprofen  (ADVIL ) 800 MG tablet TAKE 1 TABLET BY MOUTH THREE TIMES A DAY 90 tablet 1    isosorbide  mononitrate (IMDUR ) 30 MG 24 hr tablet Take 1 tablet (30 mg total) by mouth daily. 90 tablet 3   metoprolol  tartrate (LOPRESSOR ) 25 MG tablet TAKE 1 TABLET BY MOUTH TWICE A DAY 180 tablet 1   nitroGLYCERIN  (NITROSTAT ) 0.4 MG SL tablet DISSOLVE 1 TAB UNDER TONGUE FOR CHEST PAIN - IF PAIN REMAINS AFTER 5 MIN, CALL 911 AND REPEAT DOSE. MAX 3 TABS IN 15 MINUTES 25 tablet 3   ondansetron  (ZOFRAN ) 8 MG tablet TAKE 1 TABLET BY MOUTH EVERY 8 HOURS AS NEEDED 30 tablet 0   oxyCODONE  (OXY IR/ROXICODONE ) 5 MG immediate release tablet Take 2 tablets (10 mg total) by mouth every 4 (four) hours as needed for severe pain (pain score 7-10). 120 tablet 0   potassium chloride  SA (KLOR-CON  M) 20 MEQ tablet TAKE 1 TABLET (20 MEQ TOTAL) BY MOUTH DAILY. WHEN TAKING LASIX  90 tablet 1   prochlorperazine  (COMPAZINE ) 10 MG tablet Take 1 tablet (10 mg total) by mouth every 6 (six) hours as needed for nausea or vomiting. 30 tablet 0   senna-docusate (SENOKOT-S) 8.6-50 MG tablet Take 1 tablet by mouth at bedtime as needed for mild constipation. 30 tablet 0   No current facility-administered medications for  this visit.     Past Surgical History:  Procedure Laterality Date   CARDIAC CATHETERIZATION N/A 08/15/2015   Procedure: Left Heart Cath and Coronary Angiography;  Surgeon: Alm LELON Clay, MD;  Location: [facility] INVASIVE CV LAB;  Service: Cardiovascular;  Laterality: N/A;   CARDIAC CATHETERIZATION N/A 08/15/2015   Procedure: Left Heart Cath and Coronary Angiography;  Surgeon: Alm LELON Clay, MD;  Location: [facility] INVASIVE CV LAB;  Service: Cardiovascular;  Laterality: N/A;   CARDIAC CATHETERIZATION N/A 08/15/2015   Procedure: Coronary Stent Intervention;  Surgeon: Alm LELON Clay, MD;  Location: [facility] INVASIVE CV LAB;  Service: Cardiovascular;  Laterality: N/A;   IR CONVERT RIGHT NEPHROSTOMY TO NEPHROURETERAL CATH  11/12/2021   IR EXT NEPHROURETERAL CATH EXCHANGE  01/07/2022   IR NEPHROSTOMY EXCHANGE RIGHT  02/20/2022    IR NEPHROSTOMY PLACEMENT RIGHT  10/01/2021   TONSILLECTOMY       No Known Allergies    Family History  Problem Relation Age of Onset   Hypertension Other      Social History Mr. Coppedge reports that he has never smoked. He has never used smokeless tobacco. Mr. Kenley reports no history of alcohol use.    Physical Examination Today's Vitals   06/03/24 0920  BP: 108/70  Pulse: 80  SpO2: 98%  Weight: 232 lb (105.2 kg)  Height: 6' 2 (1.88 m)   Body mass index is 29.79 kg/m.  Gen: resting comfortably, no acute distress HEENT: no scleral icterus, pupils equal round and reactive, no palptable cervical adenopathy,  CV: RRR, no m/r,g no jvd Resp: Clear to auscultation bilaterally GI: abdomen is soft, non-tender, non-distended, normal bowel sounds, no hepatosplenomegaly MSK: extremities are warm, no edema.  Skin: warm, no rash Neuro:  no focal deficits Psych: appropriate affect   Diagnostic Studies  11/2015 nuclear stress There was no ST segment deviation noted during stress. Findings consistent with prior myocardial infarction. This is an intermediate risk study. The left ventricular ejection fraction is moderately decreased (30-44%).     07/2015 echo Study Conclusions   - Left ventricle: The cavity size was normal. Systolic function was   normal. The estimated ejection fraction was in the range of 55%   to 60%. Hypokinesis of the lateral and inferolateral myocardium. - Atrial septum: A patent foramen ovale cannot be excluded.     08/15/15 initial cath Prox Cx to Mid Cx lesion, 99% stenosed. PCI with Promus Premier DES or 4.0 mm x 16 mm DES (4.6 mm). Post intervention, there is a 0% residual stenosis. Ost 1st Mrg to 1st Mrg lesion, 90% stenosed. Too small for PCI. Ost 2nd Mrg to 2nd Mrg lesion, 90% stenosed. PCI with Promus Premier DES 275 mm x 12 mm (2.6 mm) Post intervention, there is a 0% residual stenosis. 3rd Mrg lesion, 80% stenosed. PCI with Promus  Premier DES 2.75 mm x 24 mm overlapping the proximal circumflex stent (tapered 4.2-3.0 mm) Post intervention, there is a 0% residual stenosis. Well-preserved LV function with no regional wall motion modalities     Severe bifurcation circumflex disease with proximal circumflex, OM 2 and OM 3 involvement. Successful difficult PCI involving proximal Circumflex-OM 2 and-OM 3 overlap segment. Rescue PCI of OM 2.   Plan: Continue Integrilin  for 2 more hours post PCI. Convert from Plavix  to Brilinta  Expected discharge tomorrow. If he has mild residual pain, would restart nitroglycerin  infusion.   08/15/2015 relook cath Patent stents in proximal Circumflex into OM3 as well as Ostial OM2. Ost 1st Mrg to  1st Mrg lesion, 90% stenosed - the likely culprit lesion for his angina Dist Cx lesion, 30% stenosed     Assessment and Plan   1. CAD chronic stable angina - recent stress test with prior infarct similar to 2017 study, no current ischemia - LVEF low normal to mildly decreased. Will change his lopressor  toprol . Low bp's limit additional medical therapy, no longer on losartan .  - continue current meds  2. HLD - repeat lipid panel     Dorn PHEBE Ross, M.D.

## 2024-06-16 ENCOUNTER — Other Ambulatory Visit: Payer: Self-pay | Admitting: Physician Assistant

## 2024-06-16 DIAGNOSIS — C61 Malignant neoplasm of prostate: Secondary | ICD-10-CM

## 2024-06-17 ENCOUNTER — Inpatient Hospital Stay: Admitting: Physician Assistant

## 2024-06-17 ENCOUNTER — Other Ambulatory Visit: Payer: Self-pay | Admitting: Physician Assistant

## 2024-06-17 ENCOUNTER — Inpatient Hospital Stay (HOSPITAL_BASED_OUTPATIENT_CLINIC_OR_DEPARTMENT_OTHER): Admitting: Physician Assistant

## 2024-06-17 ENCOUNTER — Inpatient Hospital Stay: Attending: Hematology and Oncology

## 2024-06-17 ENCOUNTER — Inpatient Hospital Stay

## 2024-06-17 VITALS — BP 117/66 | HR 74 | Temp 97.8°F | Resp 16 | Ht 74.0 in | Wt 227.7 lb

## 2024-06-17 DIAGNOSIS — C61 Malignant neoplasm of prostate: Secondary | ICD-10-CM | POA: Insufficient documentation

## 2024-06-17 DIAGNOSIS — Z7952 Long term (current) use of systemic steroids: Secondary | ICD-10-CM | POA: Insufficient documentation

## 2024-06-17 DIAGNOSIS — C7951 Secondary malignant neoplasm of bone: Secondary | ICD-10-CM | POA: Diagnosis present

## 2024-06-17 LAB — CBC WITH DIFFERENTIAL (CANCER CENTER ONLY)
Abs Immature Granulocytes: 0 K/uL (ref 0.00–0.07)
Basophils Absolute: 0 K/uL (ref 0.0–0.1)
Basophils Relative: 0 %
Eosinophils Absolute: 0.1 K/uL (ref 0.0–0.5)
Eosinophils Relative: 3 %
HCT: 31.6 % — ABNORMAL LOW (ref 39.0–52.0)
Hemoglobin: 10.6 g/dL — ABNORMAL LOW (ref 13.0–17.0)
Immature Granulocytes: 0 %
Lymphocytes Relative: 21 %
Lymphs Abs: 0.5 K/uL — ABNORMAL LOW (ref 0.7–4.0)
MCH: 30.7 pg (ref 26.0–34.0)
MCHC: 33.5 g/dL (ref 30.0–36.0)
MCV: 91.6 fL (ref 80.0–100.0)
Monocytes Absolute: 0.2 K/uL (ref 0.1–1.0)
Monocytes Relative: 8 %
Neutro Abs: 1.6 K/uL — ABNORMAL LOW (ref 1.7–7.7)
Neutrophils Relative %: 68 %
Platelet Count: 153 K/uL (ref 150–400)
RBC: 3.45 MIL/uL — ABNORMAL LOW (ref 4.22–5.81)
RDW: 15.2 % (ref 11.5–15.5)
WBC Count: 2.3 K/uL — ABNORMAL LOW (ref 4.0–10.5)
nRBC: 0 % (ref 0.0–0.2)

## 2024-06-17 LAB — CMP (CANCER CENTER ONLY)
ALT: 11 U/L (ref 0–44)
AST: 12 U/L — ABNORMAL LOW (ref 15–41)
Albumin: 3.9 g/dL (ref 3.5–5.0)
Alkaline Phosphatase: 321 U/L — ABNORMAL HIGH (ref 38–126)
Anion gap: 4 — ABNORMAL LOW (ref 5–15)
BUN: 17 mg/dL (ref 8–23)
CO2: 30 mmol/L (ref 22–32)
Calcium: 8.5 mg/dL — ABNORMAL LOW (ref 8.9–10.3)
Chloride: 109 mmol/L (ref 98–111)
Creatinine: 0.82 mg/dL (ref 0.61–1.24)
GFR, Estimated: >60 mL/min (ref >60)
Glucose, Bld: 116 mg/dL — ABNORMAL HIGH (ref 70–99)
Potassium: 4 mmol/L (ref 3.5–5.1)
Sodium: 143 mmol/L (ref 135–145)
Total Bilirubin: 1.2 mg/dL (ref 0.0–1.2)
Total Protein: 6.3 g/dL — ABNORMAL LOW (ref 6.5–8.1)

## 2024-06-17 LAB — PSA: Prostatic Specific Antigen: 773.15 ng/mL — ABNORMAL HIGH (ref 0.00–4.00)

## 2024-06-17 MED ORDER — DENOSUMAB 120 MG/1.7ML ~~LOC~~ SOLN
120.0000 mg | Freq: Once | SUBCUTANEOUS | Status: AC
  Administered 2024-06-17: 120 mg via SUBCUTANEOUS
  Filled 2024-06-17: qty 1.7

## 2024-06-17 NOTE — Progress Notes (Signed)
 [facility] [phone]   Fax:([phone]  PROGRESS NOTE  Patient Care Team: Henriette Anes, DO as PCP - General (Family Medicine) Alvan Dorn FALCON, MD as PCP - Cardiology (Cardiology)  Hematological/Oncological History # Metastatic Castrate Sensitive Prostate Cancer  09/30/2021: percutaneous nephrostomy tube placement and retroperitoneal lymph node biopsy  10/01/2021: Firmagon  240 mg  09/2021: radiation therapy to the lumbar spine after receiving 30 Gray in 10 fractions  11/01/2021: started Eligard  30 mg every 4 months and Zytiga  1000 mg daily with prednisone  5 mg daily  09/25/2022: last visit with Dr. Amadeo 10/29/2022: transition care to Dr. Federico. Insurance no longer assisting with Zytiga  1000 mg daily, converting enzalutamide  160 mg PO daily.   08/22/2023: PET scan shows progression 10/28/2023: cycle 1, day 1  of docetaxel  01/19/2024: Discontinue docetaxel  chemotherapy prior to cycle 5 as patient is not able to tolerate treatment.  02/25/2024: Dose 1 of Pluvicto  therapy.  04/01/2024: Dose 2 of Pluvicto  therapy.  05/13/2024: Dose 3 of Pluvicto  therapy.   Interval History:  Erik Page [age] y.o. male with medical history significant for [facility] presents for a follow up visit. The patient's last visit was on 02/09/2024. He presents today to prior to the start of Cycle 4 of Pluvicto .   On exam today [name] reports he is doing well without any new or concerning symptoms.  His energy is fairly stable and he can complete all his daily activities on his own.  He denies nausea, vomiting or bowel habit changes.  He denies easy bruising or signs of active bleeding.  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  He denies fevers, chills, night sweats, shortness of breath, chest pain or cough.  He has no other complaints.  Rest of the 10 point ROS as below.  MEDICAL HISTORY:  Past Medical History:  Diagnosis Date   Cancer [facility])     Prostate   HTN (hypertension)    Myocardial infarction [facility])     SURGICAL HISTORY: Past Surgical History:  Procedure Laterality Date   CARDIAC CATHETERIZATION N/A 08/15/2015   Procedure: Left Heart Cath and Coronary Angiography;  Surgeon: Alm LELON Clay, MD;  Location: [facility] INVASIVE CV LAB;  Service: Cardiovascular;  Laterality: N/A;   CARDIAC CATHETERIZATION N/A 08/15/2015   Procedure: Left Heart Cath and Coronary Angiography;  Surgeon: Alm LELON Clay, MD;  Location: [facility] INVASIVE CV LAB;  Service: Cardiovascular;  Laterality: N/A;   CARDIAC CATHETERIZATION N/A 08/15/2015   Procedure: Coronary Stent Intervention;  Surgeon: Alm LELON Clay, MD;  Location: [facility] INVASIVE CV LAB;  Service: Cardiovascular;  Laterality: N/A;   IR CONVERT RIGHT NEPHROSTOMY TO NEPHROURETERAL CATH  11/12/2021   IR EXT NEPHROURETERAL CATH EXCHANGE  01/07/2022   IR NEPHROSTOMY EXCHANGE RIGHT  02/20/2022   IR NEPHROSTOMY PLACEMENT RIGHT  10/01/2021   TONSILLECTOMY      SOCIAL HISTORY: Social History   Socioeconomic History   Marital status: Married    Spouse name: Not on file   Number of children: Not on file   Years of education: Not on file   Highest education level: Not on file  Occupational History   Not on file  Tobacco Use   Smoking status: Never   Smokeless tobacco: Never  Vaping Use   Vaping status: Never Used  Substance and Sexual Activity   Alcohol use: No    Alcohol/week: 0.0 standard drinks of alcohol   Drug use: No   Sexual activity: Not  on file  Other Topics Concern   Not on file  Social History Narrative   Works as a Pensions consultant; 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  MEDICATIONS:  Current Outpatient Medications  Medication Sig Dispense Refill   aspirin  EC 81 MG EC tablet Take 1 tablet (81 mg total) by mouth daily.     atorvastatin  (LIPITOR ) 80 MG tablet TAKE 1 TABLET BY MOUTH EVERY DAY 90 tablet 1   calcium -vitamin D (OSCAL WITH D) 500-5 MG-MCG tablet Take 1 tablet by mouth 2 (two) times daily. 90 tablet 3   feeding supplement (ENSURE ENLIVE / ENSURE PLUS) LIQD Take 237 mLs by mouth 2 (two) times daily between meals. 237 mL 12   furosemide  (LASIX ) 20 MG tablet TAKE 1 TABLET (20 MG TOTAL) BY MOUTH DAILY. AS NEEDED FOR SWELLING/EDEMA IN LEGS 90 tablet 1   ibuprofen  (ADVIL ) 800 MG tablet TAKE 1 TABLET BY MOUTH THREE TIMES A DAY 90 tablet 1   isosorbide  mononitrate (IMDUR ) 30 MG 24 hr tablet Take 1 tablet (30 mg total) by mouth daily. 90 tablet 3   metoprolol  succinate (TOPROL  XL) 25 MG 24 hr tablet Take 1 tablet (25 mg total) by mouth daily. 90 tablet 2   nitroGLYCERIN  (NITROSTAT ) 0.4 MG SL tablet DISSOLVE 1 TAB UNDER TONGUE FOR CHEST PAIN - IF PAIN REMAINS AFTER 5 MIN, CALL 911 AND REPEAT DOSE. MAX 3 TABS IN 15 MINUTES 25 tablet 3   ondansetron  (ZOFRAN ) 8 MG tablet TAKE 1 TABLET BY MOUTH EVERY 8 HOURS AS NEEDED 30 tablet 0   oxyCODONE  (OXY IR/ROXICODONE ) 5 MG immediate release tablet Take 2 tablets (10 mg total) by mouth every 4 (four) hours as needed for severe pain (pain score 7-10). 120 tablet 0   potassium chloride  SA (KLOR-CON  M) 20 MEQ tablet TAKE 1 TABLET (20 MEQ TOTAL) BY MOUTH DAILY. WHEN TAKING LASIX  90 tablet 1   prochlorperazine  (COMPAZINE ) 10 MG tablet Take 1 tablet (10 mg total) by mouth every 6 (six) hours as needed for nausea or vomiting. 30 tablet 0   senna-docusate (SENOKOT-S) 8.6-50 MG tablet Take 1 tablet by mouth at bedtime as needed for mild constipation. 30 tablet 0   No current facility-administered medications for this visit.    REVIEW OF SYSTEMS:    Constitutional: ( - ) fevers, ( - )  chills , ( - ) night sweats Eyes: ( - ) blurriness of vision, ( - ) double vision, ( - ) watery eyes Ears, nose, mouth, throat, and face: ( - ) mucositis, ( - ) sore throat Respiratory: ( - ) cough, ( - ) dyspnea, ( - ) wheezes Cardiovascular: ( - ) palpitation, ( - ) chest discomfort, ( - ) lower extremity swelling Gastrointestinal:  ( - ) nausea, ( - ) heartburn, ( - ) change in bowel habits Skin: ( - ) abnormal skin rashes Lymphatics: ( - ) new lymphadenopathy, ( - ) easy bruising Neurological: ( - ) numbness, ( - ) tingling, ( - ) new weaknesses Behavioral/Psych: ( - )  mood change, ( - ) new changes  All other systems were reviewed with the patient and are negative.  PHYSICAL EXAMINATION: ECOG PERFORMANCE STATUS: 0 - Asymptomatic  Vitals:   06/17/24 1317  BP: 117/66  Pulse: 74  Resp: 16  Temp: 97.8 F (36.6 C)  SpO2: 99%      Filed Weights   06/17/24 1317  Weight: 227 lb 11.2 oz (103.3 kg)      GENERAL: Well-appearing elderly African-American male, alert, no distress and comfortable SKIN: skin color, texture, turgor are normal, no rashes or significant lesions EYES: conjunctiva are pink and non-injected, sclera clear LUNGS: clear to auscultation and percussion with normal breathing effort HEART: regular rate & rhythm and no murmurs and no lower extremity edema Musculoskeletal: no cyanosis of digits and no clubbing  PSYCH: alert & oriented x 3, fluent speech NEURO: no focal motor/sensory deficits  LABORATORY DATA:  I have reviewed the data as listed    Latest Ref Rng & Units 06/17/2024   12:50 PM 05/06/2024    8:14 AM 03/25/2024    9:15 AM  CBC  WBC 4.0 - 10.5 K/uL 2.3  2.4  2.7   Hemoglobin 13.0 - 17.0 g/dL 89.3  89.2  89.8   Hematocrit 39.0 - 52.0 % 31.6  32.9  30.6   Platelets 150 - 400 K/uL 153  149  146        Latest Ref Rng & Units 06/17/2024   12:50 PM 05/06/2024    8:14 AM 03/25/2024    9:15 AM  CMP  Glucose 70 -  99 mg/dL 883  893  874   BUN 8 - 23 mg/dL 17  16  15    Creatinine 0.61 - 1.24 mg/dL 9.17  9.25  9.21   Sodium 135 - 145 mmol/L 143  139  140   Potassium 3.5 - 5.1 mmol/L 4.0  4.4  4.2   Chloride 98 - 111 mmol/L 109  106  107   CO2 22 - 32 mmol/L 30  27  29    Calcium  8.9 - 10.3 mg/dL 8.5  8.1  8.6   Total Protein 6.5 - 8.1 g/dL 6.3  6.4  6.4   Total Bilirubin 0.0 - 1.2 mg/dL 1.2  1.3  1.1   Alkaline Phos 38 - 126 U/L 321  382  269   AST 15 - 41 U/L 12  13  12    ALT 0 - 44 U/L 11  12  11      RADIOGRAPHIC STUDIES: No results found.    ASSESSMENT & PLAN Erik Page is a 73 y.o. male with medical history significant for [facility] presents for a follow up visit.   # Metastatic Castrate Sensitive Prostate Cancer  --continue lupron  30mg  subq q 4 months indefinitely  --started Eligard  30 mg every 4 months and Zytiga  1000 mg daily with prednisone  5 mg daily on 11/01/2021:   --transition from Zytiga  to enzalutamide  160mg  PO daily due to cost. Was able to get approval to return to Zytiga  due to faillure of enzalutamide .  --PET scan from 08/22/2023 showed progression with multiple new and progressive bone lesions.  --treatment 1 of Pluvicto  administered on 02/25/2024.  PLAN: --Due for cycle 4 of Pluvicto  next week, 06/24/2024.  --Labs today show white blood cell count 2.3, Hgb 10.6, MCV 91.6, Plt 153. Cr 0.82 -- Last PSA 959.0.  Today's is pending.  --Will administer Xgeva  q 2 months, last one was 04/01/2024. Next due today.  --Will administer Lupron   every 4 months, last one was on 04/01/2024 so next one will be due around Nov 2025  -- RTC prior to the start of cycle 5 of Pluvicto . Interval Lupron  and Xgeva  shots.     No orders of the defined types were placed in this encounter.   All questions were answered. The patient knows to call the clinic with any problems, questions or concerns.   I have spent a total of 30 minutes minutes of face-to-face and non-face-to-face time, preparing to see the  patient,performing a medically appropriate examination, counseling and educating the patient, ordering medications/tests/procedures, documenting clinical information in the electronic health record, independently interpreting results and communicating results to the patient, and care coordination.   Johnston Police PA-C Dept of Hematology and Oncology [facility] Phone: [phone]    06/17/2024 1:35 PM

## 2024-06-23 NOTE — Written Directive (Cosign Needed)
  PLUVICTO   THERAPY   RADIOPHARMACEUTICAL: Lutetium 177 vipivotide tetraxetan (Pluvicto )     PRESCRIBED DOSE FOR ADMINISTRATION:  200 mCi   ROUTE OFADMINISTRATION:  IV   DIAGNOSIS: prostate cancer metastatic to mutliple sites, Prostate cancer metastatic to multiple sites     REFERRING PHYSICIAN:Dorsey, Norleen ONEIDA CLORE, MD    TREATMENT #: 4   ADDITIONAL PHYSICIAN COMMENTS/NOTES: NO LABS TODAY  AUTHORIZED USER SIGNATURE & TIME STAMP: Norleen GORMAN Boxer, MD   06/24/24    9:41 AM

## 2024-06-24 ENCOUNTER — Ambulatory Visit: Payer: Self-pay | Admitting: Physician Assistant

## 2024-06-24 ENCOUNTER — Encounter (HOSPITAL_COMMUNITY)
Admission: RE | Admit: 2024-06-24 | Discharge: 2024-06-24 | Disposition: A | Discharge status: Home / Self Care | Source: Ambulatory Visit | Attending: Hematology and Oncology | Admitting: Hematology and Oncology

## 2024-06-24 DIAGNOSIS — C61 Malignant neoplasm of prostate: Secondary | ICD-10-CM | POA: Diagnosis present

## 2024-06-24 MED ORDER — SODIUM CHLORIDE 0.9 % IV BOLUS
1000.0000 mL | Freq: Once | INTRAVENOUS | Status: AC
  Administered 2024-06-24: 1000 mL via INTRAVENOUS

## 2024-06-24 NOTE — Progress Notes (Signed)
 EXAM: NUCLEAR MEDICINE PLUVICTO  INJECTION 06/24/2024 01:49:40 PM  TECHNIQUE: Patient presented to nuclear medicine for treatment of metastatic prostate cancer. PSMA PET scan was reviewed.  The patient's most recent labs were reviewed and it was determined to proceed with Lu-177 Pluvicto . Written informed consent was obtained from the patient. Written instructions for radiation safety were provided.   The radiopharmaceutical was injected intravenously according to local protocol.  RADIOPHARMACEUTICAL: 203.8 mCi Lu-177 PLUVICTO   COMPARISON: Prior treatments/measurements.  CLINICAL HISTORY: prostate cancer metastatic to mutliple sites. EOV; prostate cancer metastatic to mutliple sites, Prostate cancer metastatic to multiple sites , # 4 LU-177 PLUVICTO  RX COMPLETED; INIT = 204 MCI - RES 0.2 = DOSE GIVEN 203.8 MCI LU-177 PSMA LT HAND GHT; PT SURVEYED AND RELEASED   FINDINGS:  Current Infusion: Treatment number 4. Patient reports minimal adverse effects. Overall patient feeling well. Mild to moderate right hip pain. No evidence of myelosuppression or renal toxicity. Hemoglobin stable at 10.6. White blood cell count stable at 2.3. PSA current value equals 773, decreased from 959.   Planned Infusions: 6   IMPRESSION: 1. Ongoing Lu-177 Pluvicto  therapy, treatment 4 of 6 planned. 2. No evidence of myelosuppression or renal toxicity. 3. PSA decreased from 959 to 773. 4. The patient tolerated the infusion well. The patient will return in 6 weeks for the next planned infusion.

## 2024-07-29 ENCOUNTER — Inpatient Hospital Stay: Admitting: Hematology and Oncology

## 2024-07-29 ENCOUNTER — Inpatient Hospital Stay: Attending: Hematology and Oncology

## 2024-07-29 ENCOUNTER — Inpatient Hospital Stay

## 2024-07-29 VITALS — BP 126/65 | HR 67 | Temp 98.5°F | Resp 13 | Wt 232.0 lb

## 2024-07-29 DIAGNOSIS — C61 Malignant neoplasm of prostate: Secondary | ICD-10-CM | POA: Insufficient documentation

## 2024-07-29 DIAGNOSIS — C7951 Secondary malignant neoplasm of bone: Secondary | ICD-10-CM | POA: Insufficient documentation

## 2024-07-29 DIAGNOSIS — Z7952 Long term (current) use of systemic steroids: Secondary | ICD-10-CM | POA: Diagnosis not present

## 2024-07-29 DIAGNOSIS — Z79899 Other long term (current) drug therapy: Secondary | ICD-10-CM | POA: Diagnosis not present

## 2024-07-29 DIAGNOSIS — D649 Anemia, unspecified: Secondary | ICD-10-CM | POA: Diagnosis not present

## 2024-07-29 DIAGNOSIS — E876 Hypokalemia: Secondary | ICD-10-CM | POA: Insufficient documentation

## 2024-07-29 LAB — LIPID PANEL
Cholesterol: 127 mg/dL (ref 0–200)
HDL: 56 mg/dL (ref >40)
LDL Cholesterol: 52 mg/dL (ref 0–99)
Total CHOL/HDL Ratio: 2.3 ratio
Triglycerides: 91 mg/dL (ref <150)
VLDL: 18 mg/dL (ref 0–40)

## 2024-07-29 LAB — CMP (CANCER CENTER ONLY)
ALT: 10 U/L (ref 0–44)
AST: 12 U/L — ABNORMAL LOW (ref 15–41)
Albumin: 3.8 g/dL (ref 3.5–5.0)
Alkaline Phosphatase: 223 U/L — ABNORMAL HIGH (ref 38–126)
Anion gap: 4 — ABNORMAL LOW (ref 5–15)
BUN: 13 mg/dL (ref 8–23)
CO2: 28 mmol/L (ref 22–32)
Calcium: 8.9 mg/dL (ref 8.9–10.3)
Chloride: 108 mmol/L (ref 98–111)
Creatinine: 0.8 mg/dL (ref 0.61–1.24)
GFR, Estimated: >60 mL/min (ref >60)
Glucose, Bld: 102 mg/dL — ABNORMAL HIGH (ref 70–99)
Potassium: 4 mmol/L (ref 3.5–5.1)
Sodium: 140 mmol/L (ref 135–145)
Total Bilirubin: 1.3 mg/dL — ABNORMAL HIGH (ref 0.0–1.2)
Total Protein: 6.2 g/dL — ABNORMAL LOW (ref 6.5–8.1)

## 2024-07-29 LAB — CBC WITH DIFFERENTIAL (CANCER CENTER ONLY)
Abs Immature Granulocytes: 0.01 K/uL (ref 0.00–0.07)
Basophils Absolute: 0 K/uL (ref 0.0–0.1)
Basophils Relative: 1 %
Eosinophils Absolute: 0.1 K/uL (ref 0.0–0.5)
Eosinophils Relative: 4 %
HCT: 30.6 % — ABNORMAL LOW (ref 39.0–52.0)
Hemoglobin: 10.1 g/dL — ABNORMAL LOW (ref 13.0–17.0)
Immature Granulocytes: 1 %
Lymphocytes Relative: 25 %
Lymphs Abs: 0.5 K/uL — ABNORMAL LOW (ref 0.7–4.0)
MCH: 31.2 pg (ref 26.0–34.0)
MCHC: 33 g/dL (ref 30.0–36.0)
MCV: 94.4 fL (ref 80.0–100.0)
Monocytes Absolute: 0.2 K/uL (ref 0.1–1.0)
Monocytes Relative: 11 %
Neutro Abs: 1.1 K/uL — ABNORMAL LOW (ref 1.7–7.7)
Neutrophils Relative %: 58 %
Platelet Count: 135 K/uL — ABNORMAL LOW (ref 150–400)
RBC: 3.24 MIL/uL — ABNORMAL LOW (ref 4.22–5.81)
RDW: 14.1 % (ref 11.5–15.5)
WBC Count: 1.9 K/uL — ABNORMAL LOW (ref 4.0–10.5)
nRBC: 0 % (ref 0.0–0.2)

## 2024-07-29 LAB — PSA: Prostatic Specific Antigen: 513.67 ng/mL — ABNORMAL HIGH (ref 0.00–4.00)

## 2024-07-29 MED ORDER — LEUPROLIDE ACETATE (4 MONTH) 30 MG ~~LOC~~ KIT
30.0000 mg | PACK | Freq: Once | SUBCUTANEOUS | Status: AC
  Administered 2024-07-29: 30 mg via SUBCUTANEOUS
  Filled 2024-07-29: qty 30

## 2024-07-29 MED ORDER — ALPRAZOLAM 0.25 MG PO TABS: 0.2500 mg | ORAL_TABLET | Freq: Every evening | ORAL | 0 refills | Status: AC | PRN

## 2024-07-29 MED ORDER — DENOSUMAB 120 MG/1.7ML ~~LOC~~ SOLN: 120.0000 mg | Freq: Once | SUBCUTANEOUS | Status: DC

## 2024-07-29 NOTE — Progress Notes (Signed)
 [facility] [phone]   Fax:([phone]  PROGRESS NOTE  Patient Care Team: Henriette Anes, DO as PCP - General (Family Medicine) Alvan Dorn FALCON, MD as PCP - Cardiology (Cardiology)  Hematological/Oncological History # Metastatic Castrate Sensitive Prostate Cancer  09/30/2021: percutaneous nephrostomy tube placement and retroperitoneal lymph node biopsy  10/01/2021: Firmagon  240 mg  09/2021: radiation therapy to the lumbar spine after receiving 30 Gray in 10 fractions  11/01/2021: started Eligard  30 mg every 4 months and Zytiga  1000 mg daily with prednisone  5 mg daily  09/25/2022: last visit with Dr. Amadeo 10/29/2022: transition care to Dr. Federico. Insurance no longer assisting with Zytiga  1000 mg daily, converting enzalutamide  160 mg PO daily.   08/22/2023: PET scan shows progression 10/28/2023: cycle 1, day 1  of docetaxel  01/19/2024: Discontinue docetaxel  chemotherapy prior to cycle 5 as patient is not able to tolerate treatment.  02/25/2024: Dose 1 of Pluvicto  therapy.   Interval History:  [place] [age] y.o. male with medical history significant for [facility] presents for a follow up visit. The patient's last visit was on 06/17/2024. He presents today to prior to the start of Cycle 5 of Pluvicto .   On exam today [name] reports he has been well overall in the interim since our last visit.  He reports that he is tolerating his Pluvicto  well without any major side effects.  He reports he has occasionally take ibuprofen  but refuses to take oxycodone .  He notes he is also having some sleeping issues and is requested a prescription for Xanax 0.25 mg.  He reports it helps calm his mind and help him sleep.  He reports that it helps relax his mind and body.  He reports his appetite is good and his weight is up about 5 pounds.  He also reports that he is tolerating his Lupron  injections without any difficulties.  Overall he feels well and is willing and able to continue on  Pluvicto  and Lupron  at this time.  A full 10 point ROS is otherwise negative.  MEDICAL HISTORY:  Past Medical History:  Diagnosis Date   Cancer [facility])    Prostate   HTN (hypertension)    Myocardial infarction [facility])     SURGICAL HISTORY: Past Surgical History:  Procedure Laterality Date   CARDIAC CATHETERIZATION N/A 08/15/2015   Procedure: Left Heart Cath and Coronary Angiography;  Surgeon: Alm LELON Clay, MD;  Location: [facility];  Service: Cardiovascular;  Laterality: N/A;   CARDIAC CATHETERIZATION N/A 08/15/2015   Procedure: Left Heart Cath and Coronary Angiography;  Surgeon: Alm LELON Clay, MD;  Location: [facility];  Service: Cardiovascular;  Laterality: N/A;   CARDIAC CATHETERIZATION N/A 08/15/2015   Procedure: Coronary Stent Intervention;  Surgeon: Alm LELON Clay, MD;  [facility];  Service: Cardiovascular;  Laterality: N/A;   IR CONVERT RIGHT NEPHROSTOMY TO NEPHROURETERAL CATH  11/12/2021   IR EXT NEPHROURETERAL CATH EXCHANGE  01/07/2022   IR NEPHROSTOMY EXCHANGE RIGHT  02/20/2022   IR NEPHROSTOMY PLACEMENT RIGHT  10/01/2021   TONSILLECTOMY      SOCIAL HISTORY: Social History   Socioeconomic History   Marital status: Married    Spouse name: Not on file   Number of children: Not on file   Years of education: Not on file   Highest education level: Not on file  Occupational History   Not on file  Tobacco Use   Smoking status: Never   Smokeless tobacco: Never  Vaping Use  Vaping status: Never Used  Substance and Sexual Activity   Alcohol use: No    Alcohol/week: 0.0 standard drinks of alcohol   Drug use: No   Sexual activity: Not on file  Other Topics Concern   Not on file  Social History Narrative   Works as a Pensions Consultant; 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  MEDICATIONS:  Current Outpatient Medications  Medication Sig Dispense Refill   ALPRAZolam (XANAX) 0.25 MG tablet Take 1 tablet (0.25 mg total) by mouth at bedtime as needed for anxiety. 60 tablet 0   aspirin  EC 81 MG EC tablet Take 1 tablet (81 mg total) by mouth daily.     atorvastatin  (LIPITOR ) 80 MG tablet TAKE 1 TABLET BY MOUTH EVERY DAY 90 tablet 1   calcium -vitamin D (OSCAL WITH D) 500-5 MG-MCG tablet Take 1 tablet by mouth 2 (two) times daily. 90 tablet 3   feeding supplement (ENSURE ENLIVE / ENSURE PLUS) LIQD Take 237 mLs by mouth 2 (two) times daily between meals. 237 mL 12   furosemide  (LASIX ) 20 MG tablet TAKE 1 TABLET (20 MG TOTAL) BY MOUTH DAILY. AS NEEDED FOR SWELLING/EDEMA IN LEGS 90 tablet 1   ibuprofen  (ADVIL ) 800 MG tablet TAKE 1 TABLET BY MOUTH THREE TIMES A DAY 90 tablet 1   metoprolol  succinate (TOPROL  XL) 25 MG 24 hr tablet Take 1 tablet (25 mg total) by mouth daily. 90 tablet 2   nitroGLYCERIN  (NITROSTAT ) 0.4 MG SL tablet DISSOLVE 1 TAB UNDER TONGUE FOR CHEST PAIN - IF PAIN REMAINS AFTER 5 MIN, CALL 911 AND REPEAT DOSE. MAX 3 TABS IN 15 MINUTES 25 tablet 3   ondansetron  (ZOFRAN ) 8 MG tablet TAKE 1 TABLET BY MOUTH EVERY 8 HOURS AS NEEDED 30 tablet 0   oxyCODONE  (OXY IR/ROXICODONE ) 5 MG immediate release tablet Take 2 tablets (10 mg total) by mouth every 4 (four) hours as needed for severe pain (pain score 7-10). 120 tablet 0   potassium chloride  SA (KLOR-CON  M) 20 MEQ tablet TAKE 1 TABLET (20 MEQ TOTAL) BY MOUTH DAILY. WHEN TAKING LASIX  90 tablet 1   prochlorperazine  (COMPAZINE ) 10 MG tablet Take 1 tablet (10 mg total) by mouth every 6 (six) hours as needed for nausea or vomiting. 30 tablet 0   senna-docusate (SENOKOT-S) 8.6-50 MG tablet Take 1 tablet by  mouth at bedtime as needed for mild constipation. 30 tablet 0   No current facility-administered medications for this visit.    REVIEW OF SYSTEMS:   Constitutional: ( - ) fevers, ( - )  chills , ( - ) night sweats Eyes: ( - ) blurriness of vision, ( - ) double vision, ( - ) watery eyes Ears, nose, mouth, throat, and face: ( - ) mucositis, ( - ) sore throat Respiratory: ( - ) cough, ( - ) dyspnea, ( - ) wheezes Cardiovascular: ( - ) palpitation, ( - ) chest discomfort, ( - ) lower extremity swelling Gastrointestinal:  ( - ) nausea, ( - ) heartburn, ( - ) change in bowel habits  Skin: ( - ) abnormal skin rashes Lymphatics: ( - ) new lymphadenopathy, ( - ) easy bruising Neurological: ( - ) numbness, ( - ) tingling, ( - ) new weaknesses Behavioral/Psych: ( - ) mood change, ( - ) new changes  All other systems were reviewed with the patient and are negative.  PHYSICAL EXAMINATION: ECOG PERFORMANCE STATUS: 0 - Asymptomatic  Vitals:   07/29/24 1030  BP: 126/65  Pulse: 67  Resp: 13  Temp: 98.5 F (36.9 C)  SpO2: 100%       Filed Weights   07/29/24 1030  Weight: 232 lb (105.2 kg)       GENERAL: Well-appearing elderly African-American male, alert, no distress and comfortable SKIN: skin color, texture, turgor are normal, no rashes or significant lesions EYES: conjunctiva are pink and non-injected, sclera clear LUNGS: clear to auscultation and percussion with normal breathing effort HEART: regular rate & rhythm and no murmurs and no lower extremity edema Musculoskeletal: no cyanosis of digits and no clubbing  PSYCH: alert & oriented x 3, fluent speech NEURO: no focal motor/sensory deficits  LABORATORY DATA:  I have reviewed the data as listed    Latest Ref Rng & Units 07/29/2024    9:37 AM 06/17/2024   12:50 PM 05/06/2024    8:14 AM  CBC  WBC 4.0 - 10.5 K/uL 1.9  2.3  2.4   Hemoglobin 13.0 - 17.0 g/dL 89.8  89.3  89.2   Hematocrit 39.0 - 52.0 % 30.6  31.6  32.9    Platelets 150 - 400 K/uL 135  153  149        Latest Ref Rng & Units 07/29/2024    9:37 AM 06/17/2024   12:50 PM 05/06/2024    8:14 AM  CMP  Glucose 70 - 99 mg/dL 897  883  893   BUN 8 - 23 mg/dL 13  17  16    Creatinine 0.61 - 1.24 mg/dL 9.19  9.17  9.25   Sodium 135 - 145 mmol/L 140  143  139   Potassium 3.5 - 5.1 mmol/L 4.0  4.0  4.4   Chloride 98 - 111 mmol/L 108  109  106   CO2 22 - 32 mmol/L 28  30  27    Calcium  8.9 - 10.3 mg/dL 8.9  8.5  8.1   Total Protein 6.5 - 8.1 g/dL 6.2  6.3  6.4   Total Bilirubin 0.0 - 1.2 mg/dL 1.3  1.2  1.3   Alkaline Phos 38 - 126 U/L 223  321  382   AST 15 - 41 U/L 12  12  13    ALT 0 - 44 U/L 10  11  12      RADIOGRAPHIC STUDIES: No results found.    ASSESSMENT & PLAN [facility] is a 73 y.o. male with medical history significant for [facility] presents for a follow up visit.   # Metastatic Castrate Sensitive Prostate Cancer  --continue lupron  30mg  subq q 4 months indefinitely  --started Eligard  30 mg every 4 months and Zytiga  1000 mg daily with prednisone  5 mg daily on 11/01/2021:   --transition from Zytiga  to enzalutamide  160mg  PO daily due to cost. Was able to get approval to return to Zytiga  due to faillure of enzalutamide .  --PET scan from 08/22/2023 showed progression with multiple new and progressive bone lesions.  --treatment 1 of Pluvicto  administered on 02/25/2024.  PLAN: --Due for cycle 5 of Pluvicto  next week.  --After discussion of treatment options moving forward  the patient noted that he would like to pursue Pluvicto  therapy.  Discussed risks and benefits. --Labs today show white blood cell count 1.9, hemoglobin 10.1, MCV 94.4, platelets 135 -- Last PSA 773.15 on 06/17/2024.  --Will administer Xgeva  q 2 months, last one was 06/17/2024. Next due in late Nov/Early Dec 2025.  --Will administer Lupron  every 4 months, last one was on 04/01/2024 so next one will be due around Nov 2025  -- RTC prior to the start of cycle 6 of Pluvicto . Interval  Lupron  and Xgeva  shots.   #Hypokalemia: --Potassium level is 4.0  --Advised to incorporate potassium rich foods into his diet.    No orders of the defined types were placed in this encounter.   All questions were answered. The patient knows to call the clinic with any problems, questions or concerns.  A total of more than 30 minutes were spent on this encounter with face-to-face time and non-face-to-face time, including preparing to see the patient, ordering tests and/or medications, counseling the patient and coordination of care as outlined above.   Norleen IVAR Kidney, MD Department of Hematology/Oncology [facility] Phone: [phone] Pager: [phone] Email: [email or web address]   07/29/2024 5:31 PM

## 2024-07-31 ENCOUNTER — Other Ambulatory Visit: Payer: Self-pay | Admitting: Hematology and Oncology

## 2024-08-01 ENCOUNTER — Other Ambulatory Visit: Payer: Self-pay | Admitting: Hematology and Oncology

## 2024-08-02 ENCOUNTER — Ambulatory Visit: Payer: Self-pay | Admitting: Physician Assistant

## 2024-08-04 NOTE — Written Directive (Addendum)
  PLUVICTO   THERAPY   RADIOPHARMACEUTICAL: Lutetium 177 vipivotide tetraxetan (Pluvicto )     PRESCRIBED DOSE FOR ADMINISTRATION:  200 mCi   ROUTE OFADMINISTRATION:  IV   DIAGNOSIS:   prostate cancer metastatic to mutliple sites  Dx: Prostate cancer metastatic to multiple sites [facility]) [email or web address] (ICD-10-CM)     REFERRING PHYSICIAN: Federico Norleen ONEIDA MADISON, MD   TREATMENT #: 5   ADDITIONAL PHYSICIAN COMMENTS/NOTES: NO LABS TODAY  AUTHORIZED USER SIGNATURE & TIME STAMP:  Norleen GORMAN Boxer, MD   08/05/24    10:47 AM

## 2024-08-05 ENCOUNTER — Encounter (HOSPITAL_COMMUNITY)
Admission: RE | Admit: 2024-08-05 | Discharge: 2024-08-05 | Disposition: A | Discharge status: Home / Self Care | Source: Ambulatory Visit | Attending: Hematology and Oncology | Admitting: Hematology and Oncology

## 2024-08-05 DIAGNOSIS — C61 Malignant neoplasm of prostate: Secondary | ICD-10-CM | POA: Diagnosis present

## 2024-08-05 MED ORDER — LUTETIUM LU 177 VIPIVOTIDE TET 1000 MBQ/ML IV SOLN
200.9100 | Freq: Once | INTRAVENOUS | Status: AC
  Administered 2024-08-05: 200.91 via INTRAVENOUS

## 2024-08-05 MED ORDER — SODIUM CHLORIDE 0.9 % IV BOLUS: 1000.0000 mL | Freq: Once | INTRAVENOUS | Status: DC

## 2024-08-05 MED ORDER — SODIUM CHLORIDE 0.9 % IV SOLN: INTRAVENOUS | Status: AC

## 2024-08-05 NOTE — Progress Notes (Signed)
 EXAM: NUCLEAR MEDICINE PLUVICTO  INJECTION 08/05/2024 03:09:15 PM TECHNIQUE: Patient presented to nuclear medicine for treatment of metastatic prostate cancer. PSMA PET scan was reviewed. The patient's most recent labs were reviewed and it was determined to proceed with Lu-177 Pluvicto . Written informed consent was obtained from the patient. Written instructions for radiation safety were provided. The radiopharmaceutical was injected intravenously according to local protocol. RADIOPHARMACEUTICAL: 200.91 mCi Lu-177 PLUVICTO  COMPARISON: None available. CLINICAL HISTORY: Prostate cancer metastatic to multiple sites.   FINDINGS: Current Infusion: This is the 5th treatment. The administered volume was 200.91 ml Patient reports no interval adverse effects. Patient remains mildly anemic with hemoglobin within 10.1. Stable decreased white blood cell count at 1.9. Platelet count 135. Alkaline phosphatase slightly decreased. PSA is decreased to 513 from 773. Creatinine normal. Planned Infusions: 6 IMPRESSION: Treatment 5 of 6 planned Lu-177 Pluvicto  treatments. Patient tolerated the infusion well without interval adverse effects. PSA has decreased from 773 to 513.

## 2024-08-05 NOTE — Progress Notes (Signed)
 Pt tolerated Pluvicto  well.  No concerns

## 2024-09-02 ENCOUNTER — Other Ambulatory Visit: Payer: Self-pay | Admitting: Hematology and Oncology

## 2024-09-02 ENCOUNTER — Inpatient Hospital Stay: Attending: Hematology and Oncology

## 2024-09-02 ENCOUNTER — Inpatient Hospital Stay: Admitting: Hematology and Oncology

## 2024-09-02 ENCOUNTER — Inpatient Hospital Stay

## 2024-09-02 VITALS — BP 123/79 | HR 71 | Temp 97.8°F | Resp 13 | Wt 232.8 lb

## 2024-09-02 DIAGNOSIS — C61 Malignant neoplasm of prostate: Secondary | ICD-10-CM

## 2024-09-02 DIAGNOSIS — Z7952 Long term (current) use of systemic steroids: Secondary | ICD-10-CM | POA: Diagnosis not present

## 2024-09-02 DIAGNOSIS — Z79899 Other long term (current) drug therapy: Secondary | ICD-10-CM | POA: Insufficient documentation

## 2024-09-02 DIAGNOSIS — C7951 Secondary malignant neoplasm of bone: Secondary | ICD-10-CM | POA: Diagnosis present

## 2024-09-02 DIAGNOSIS — E876 Hypokalemia: Secondary | ICD-10-CM | POA: Insufficient documentation

## 2024-09-02 LAB — CMP (CANCER CENTER ONLY)
ALT: 12 U/L (ref 0–44)
AST: 19 U/L (ref 15–41)
Albumin: 4.2 g/dL (ref 3.5–5.0)
Alkaline Phosphatase: 241 U/L — ABNORMAL HIGH (ref 38–126)
Anion gap: 9 (ref 5–15)
BUN: 13 mg/dL (ref 8–23)
CO2: 27 mmol/L (ref 22–32)
Calcium: 8.8 mg/dL — ABNORMAL LOW (ref 8.9–10.3)
Chloride: 105 mmol/L (ref 98–111)
Creatinine: 0.81 mg/dL (ref 0.61–1.24)
GFR, Estimated: >60 mL/min (ref >60)
Glucose, Bld: 110 mg/dL — ABNORMAL HIGH (ref 70–99)
Potassium: 4.2 mmol/L (ref 3.5–5.1)
Sodium: 141 mmol/L (ref 135–145)
Total Bilirubin: 1.3 mg/dL — ABNORMAL HIGH (ref 0.0–1.2)
Total Protein: 6.6 g/dL (ref 6.5–8.1)

## 2024-09-02 LAB — CBC WITH DIFFERENTIAL (CANCER CENTER ONLY)
Abs Immature Granulocytes: 0.01 K/uL (ref 0.00–0.07)
Basophils Absolute: 0 K/uL (ref 0.0–0.1)
Basophils Relative: 0 %
Eosinophils Absolute: 0.1 K/uL (ref 0.0–0.5)
Eosinophils Relative: 3 %
HCT: 31.7 % — ABNORMAL LOW (ref 39.0–52.0)
Hemoglobin: 10.6 g/dL — ABNORMAL LOW (ref 13.0–17.0)
Immature Granulocytes: 0 %
Lymphocytes Relative: 22 %
Lymphs Abs: 0.5 K/uL — ABNORMAL LOW (ref 0.7–4.0)
MCH: 31.8 pg (ref 26.0–34.0)
MCHC: 33.4 g/dL (ref 30.0–36.0)
MCV: 95.2 fL (ref 80.0–100.0)
Monocytes Absolute: 0.2 K/uL (ref 0.1–1.0)
Monocytes Relative: 8 %
Neutro Abs: 1.5 K/uL — ABNORMAL LOW (ref 1.7–7.7)
Neutrophils Relative %: 67 %
Platelet Count: 128 K/uL — ABNORMAL LOW (ref 150–400)
RBC: 3.33 MIL/uL — ABNORMAL LOW (ref 4.22–5.81)
RDW: 13.2 % (ref 11.5–15.5)
WBC Count: 2.3 K/uL — ABNORMAL LOW (ref 4.0–10.5)
nRBC: 0 % (ref 0.0–0.2)

## 2024-09-02 LAB — PSA: Prostatic Specific Antigen: 526.68 ng/mL — ABNORMAL HIGH (ref 0.00–4.00)

## 2024-09-02 MED ORDER — DENOSUMAB 120 MG/1.7ML ~~LOC~~ SOLN
120.0000 mg | Freq: Once | SUBCUTANEOUS | Status: AC
  Administered 2024-09-02: 120 mg via SUBCUTANEOUS
  Filled 2024-09-02: qty 1.7

## 2024-09-02 NOTE — Progress Notes (Signed)
 Ok to treat with Ca 8.8, Alb 4.2.  Bridgett Leach [place], [place], BCPS, BCOP 09/02/2024 10:19 AM

## 2024-09-02 NOTE — Progress Notes (Signed)
 [facility] [phone]) [phone]   Fax:(336) [phone]  PROGRESS NOTE  Patient Care Team: Henriette Anes, DO as PCP - General (Family Medicine) Alvan Dorn FALCON, MD as PCP - Cardiology (Cardiology)  Hematological/Oncological History # Metastatic Castrate Sensitive Prostate Cancer  09/30/2021: percutaneous nephrostomy tube placement and retroperitoneal lymph node biopsy  10/01/2021: Firmagon  240 mg  09/2021: radiation therapy to the lumbar spine after receiving 30 Gray in 10 fractions  11/01/2021: started Eligard  30 mg every 4 months and Zytiga  1000 mg daily with prednisone  5 mg daily  09/25/2022: last visit with Dr. Amadeo 10/29/2022: transition care to Dr. Federico. Insurance no longer assisting with Zytiga  1000 mg daily, converting enzalutamide  160 mg PO daily.   08/22/2023: PET scan shows progression 10/28/2023: cycle 1, day 1  of docetaxel  01/19/2024: Discontinue docetaxel  chemotherapy prior to cycle 5 as patient is not able to tolerate treatment.  02/25/2024: Dose 1 of Pluvicto  therapy.   Interval History:  [facility] 73 y.o. male with medical history significant for [facility] presents for a follow up visit. The patient's last visit was on 07/29/2024. He presents today to prior to the start of Cycle 6 of Pluvicto .   On exam today [facility] reports he has been well overall since our last visit.  He reports he is tolerating his treatments well and has had no trouble with the Pluvicto .  He is had no issues with nausea, vomiting, or diarrhea.  His energy is stable and he feels like his appetite is too good.  He notes that he had a wonderful Thanksgiving where he made a red velvet cake.  He is not currently having any pain anywhere.  He did recently have a fall where he fell onto the grass near his home.  He reports that he slipped because of the snow and that he was looking up at the sky.  He notes that he did not have any residual pain and did not hit his head.  Overall he feels well  and is willing and able to proceed with further therapy at this time.  Full 10 point ROS is otherwise negative.  MEDICAL HISTORY:  Past Medical History:  Diagnosis Date   Cancer [facility])    Prostate   HTN (hypertension)    Myocardial infarction [facility])     SURGICAL HISTORY: Past Surgical History:  Procedure Laterality Date   CARDIAC CATHETERIZATION N/A 08/15/2015   Procedure: Left Heart Cath and Coronary Angiography;  Surgeon: Alm LELON Clay, MD;  Location: [facility] INVASIVE CV LAB;  Service: Cardiovascular;  Laterality: N/A;   CARDIAC CATHETERIZATION N/A 08/15/2015   Procedure: Left Heart Cath and Coronary Angiography;  Surgeon: Alm LELON Clay, MD;  Location: [facility] INVASIVE CV LAB;  Service: Cardiovascular;  Laterality: N/A;   CARDIAC CATHETERIZATION N/A 08/15/2015   Procedure: Coronary Stent Intervention;  Surgeon: Alm LELON Clay, MD;  Location: [facility] INVASIVE CV LAB;  Service: Cardiovascular;  Laterality: N/A;   IR CONVERT RIGHT NEPHROSTOMY TO NEPHROURETERAL CATH  11/12/2021   IR EXT NEPHROURETERAL CATH EXCHANGE  01/07/2022   IR NEPHROSTOMY EXCHANGE RIGHT  02/20/2022   IR NEPHROSTOMY PLACEMENT RIGHT  10/01/2021   TONSILLECTOMY      SOCIAL HISTORY: Social History   Socioeconomic History   Marital status: Married    Spouse name: Not on file   Number of children: Not on file   Years of education: Not on file   Highest education level: Not on file  Occupational History   Not on file  Tobacco Use   Smoking status: Never   Smokeless tobacco: Never  Vaping Use   Vaping status: Never Used  Substance and Sexual Activity   Alcohol use: No    Alcohol/week: 0.0 standard drinks of alcohol   Drug use: No   Sexual activity: Not on file  Other Topics Concern   Not on file  Social History Narrative   Works as a Pensions Consultant; 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   Social Drivers of Health   Tobacco Use: Low Risk (06/03/2024)   Patient History    Smoking Tobacco Use: Never    Smokeless Tobacco Use:  Never    Passive Exposure: Not on file  Financial Resource Strain: Not on file  Food Insecurity: Not on file  Transportation Needs: Not on file  Physical Activity: Not on file  Stress: Not on file  Social Connections: Not on file  Intimate Partner Violence: Not on file  Depression (PHQ2-9): Low Risk (07/29/2024)   Depression (PHQ2-9)    PHQ-2 Score: 0  Alcohol Screen: Not on file  Housing: Not on file  Utilities: Not on file  Health Literacy: Not on file    FAMILY HISTORY: Family History  Problem Relation Age of Onset   Hypertension Other     ALLERGIES:  has no known allergies.  MEDICATIONS:  Current Outpatient Medications  Medication Sig Dispense Refill   ALPRAZolam  (XANAX ) 0.25 MG tablet Take 1 tablet (0.25 mg total) by mouth at bedtime as needed for anxiety. 60 tablet 0   aspirin  EC 81 MG EC tablet Take 1 tablet (81 mg total) by mouth daily.     atorvastatin  (LIPITOR ) 80 MG tablet TAKE 1 TABLET BY MOUTH EVERY DAY 90 tablet 1   calcium -vitamin D (OSCAL WITH D) 500-5 MG-MCG tablet Take 1 tablet by mouth 2 (two) times daily. 90 tablet 3   feeding supplement (ENSURE ENLIVE / ENSURE PLUS) LIQD Take 237 mLs by mouth 2 (two) times daily between meals. 237 mL 12   furosemide  (LASIX ) 20 MG tablet TAKE 1 TABLET (20 MG TOTAL) BY MOUTH DAILY. AS NEEDED FOR SWELLING/EDEMA IN LEGS 90 tablet 1   ibuprofen  (ADVIL ) 800 MG tablet TAKE 1 TABLET BY MOUTH THREE TIMES A DAY 90 tablet 1   metoprolol  succinate (TOPROL  XL) 25 MG 24 hr tablet Take 1 tablet (25 mg total) by mouth daily. 90 tablet 2   nitroGLYCERIN  (NITROSTAT ) 0.4 MG SL tablet DISSOLVE 1 TAB UNDER TONGUE FOR CHEST PAIN - IF PAIN REMAINS AFTER 5 MIN, CALL 911 AND REPEAT DOSE. MAX 3 TABS IN 15 MINUTES 25 tablet 3   ondansetron  (ZOFRAN ) 8 MG tablet TAKE 1 TABLET BY MOUTH EVERY 8 HOURS AS NEEDED 30 tablet 0   oxyCODONE  (OXY IR/ROXICODONE ) 5 MG immediate release tablet Take 2 tablets (10 mg total) by mouth every 4 (four) hours as needed for  severe pain (pain score 7-10). 120 tablet 0   potassium chloride  SA (KLOR-CON  M) 20 MEQ tablet TAKE 1 TABLET (20 MEQ TOTAL) BY MOUTH DAILY. WHEN TAKING LASIX  90 tablet 1   prochlorperazine  (COMPAZINE ) 10 MG tablet Take 1 tablet (10 mg total) by mouth every 6 (six) hours as needed for nausea or vomiting. 30 tablet 0   senna-docusate (SENOKOT-S) 8.6-50 MG tablet Take 1 tablet by mouth at bedtime as needed for mild constipation. 30 tablet 0   No current facility-administered medications for this visit.    REVIEW OF SYSTEMS:   Constitutional: ( - ) fevers, ( - )  chills , ( - ) night sweats Eyes: ( - ) blurriness of vision, ( - ) double vision, ( - ) watery eyes Ears, nose, mouth, throat, and face: ( - ) mucositis, ( - ) sore throat Respiratory: ( - ) cough, ( - ) dyspnea, ( - ) wheezes Cardiovascular: ( - ) palpitation, ( - ) chest discomfort, ( - ) lower extremity swelling Gastrointestinal:  ( - ) nausea, ( - ) heartburn, ( - ) change in bowel habits Skin: ( - ) abnormal skin rashes Lymphatics: ( - ) new lymphadenopathy, ( - ) easy bruising Neurological: ( - ) numbness, ( - ) tingling, ( - ) new weaknesses Behavioral/Psych: ( - ) mood change, ( - ) new changes  All other systems were reviewed with the patient and are negative.  PHYSICAL EXAMINATION: ECOG PERFORMANCE STATUS: 0 - Asymptomatic  Vitals:   09/02/24 0959  BP: 123/79  Pulse: 71  Resp: 13  Temp: 97.8 F (36.6 C)  SpO2: 100%        Filed Weights   09/02/24 0959  Weight: 232 lb 12.8 oz (105.6 kg)        GENERAL: Well-appearing elderly African-American male, alert, no distress and comfortable SKIN: skin color, texture, turgor are normal, no rashes or significant lesions EYES: conjunctiva are pink and non-injected, sclera clear LUNGS: clear to auscultation and percussion with normal breathing effort HEART: regular rate & rhythm and no murmurs and no lower extremity edema Musculoskeletal: no cyanosis of digits  and no clubbing  PSYCH: alert & oriented x 3, fluent speech NEURO: no focal motor/sensory deficits  LABORATORY DATA:  I have reviewed the data as listed    Latest Ref Rng & Units 09/02/2024    9:13 AM 07/29/2024    9:37 AM 06/17/2024   12:50 PM  CBC  WBC 4.0 - 10.5 K/uL 2.3  1.9  2.3   Hemoglobin 13.0 - 17.0 g/dL 89.3  89.8  89.3   Hematocrit 39.0 - 52.0 % 31.7  30.6  31.6   Platelets 150 - 400 K/uL 128  135  153        Latest Ref Rng & Units 09/02/2024    9:13 AM 07/29/2024    9:37 AM 06/17/2024   12:50 PM  CMP  Glucose 70 - 99 mg/dL 889  897  883   BUN 8 - 23 mg/dL 13  13  17    Creatinine 0.61 - 1.24 mg/dL 9.18  9.19  9.17   Sodium 135 - 145 mmol/L 141  140  143   Potassium 3.5 - 5.1 mmol/L 4.2  4.0  4.0   Chloride 98 - 111 mmol/L 105  108  109   CO2 22 - 32 mmol/L 27  28  30    Calcium  8.9 - 10.3 mg/dL 8.8  8.9  8.5   Total Protein 6.5 - 8.1 g/dL 6.6  6.2  6.3   Total Bilirubin 0.0 - 1.2 mg/dL 1.3  1.3  1.2   Alkaline Phos 38 - 126 U/L 241  223  321   AST 15 - 41 U/L 19  12  12    ALT 0 - 44 U/L 12  10  11      RADIOGRAPHIC STUDIES: No results found.     ASSESSMENT & PLAN [facility] is a 73 y.o. male with medical history significant for [facility] presents for a follow up visit.   # Metastatic Castrate Sensitive Prostate Cancer  --continue lupron  30mg  subq q  4 months indefinitely  --started Eligard  30 mg every 4 months and Zytiga  1000 mg daily with prednisone  5 mg daily on 11/01/2021:   --transition from Zytiga  to enzalutamide  160mg  PO daily due to cost. Was able to get approval to return to Zytiga  due to faillure of enzalutamide .  --PET scan from 08/22/2023 showed progression with multiple new and progressive bone lesions.  --treatment 1 of Pluvicto  administered on 02/25/2024.  PLAN: --Due for cycle 6 of Pluvicto  next week.  --After discussion of treatment options moving forward the patient noted that he would like to pursue Pluvicto  therapy.  Discussed risks and  benefits. --Labs today show white blood cell count 2.3, Hgb 10.6, MCV 95.2, Plt 128  -- Last PSA 513.67 --Will administer Xgeva  q 2 months, last one was 06/17/2024. Next due today and again in Feb 2026.  --Will administer Lupron  every 4 months, last one was on 07/29/2024 so next one will be due around March 2026  -- RTC 6 weeks after last dose of Pluvicto .   #Hypokalemia: --Potassium level is 4.0  --Advised to incorporate potassium rich foods into his diet.    No orders of the defined types were placed in this encounter.   All questions were answered. The patient knows to call the clinic with any problems, questions or concerns.  A total of more than 30 minutes were spent on this encounter with face-to-face time and non-face-to-face time, including preparing to see the patient, ordering tests and/or medications, counseling the patient and coordination of care as outlined above.   Erik IVAR Kidney, MD Department of Hematology/Oncology [facility] Phone: [phone] Pager: [phone] Email: [email or web address]   09/05/2024 4:55 PM

## 2024-09-05 ENCOUNTER — Encounter: Payer: Self-pay | Admitting: Hematology and Oncology

## 2024-09-09 ENCOUNTER — Ambulatory Visit (HOSPITAL_COMMUNITY)
Admission: RE | Admit: 2024-09-09 | Discharge: 2024-09-09 | Disposition: A | Discharge status: Home / Self Care | Source: Ambulatory Visit | Attending: Hematology and Oncology | Admitting: Hematology and Oncology

## 2024-09-09 DIAGNOSIS — C61 Malignant neoplasm of prostate: Secondary | ICD-10-CM | POA: Diagnosis present

## 2024-09-09 MED ORDER — LUTETIUM LU 177 VIPIVOTIDE TET 1000 MBQ/ML IV SOLN
203.5000 | Freq: Once | INTRAVENOUS | Status: AC
  Administered 2024-09-09: 12:00:00 203.5 via INTRAVENOUS

## 2024-09-09 MED ORDER — SODIUM CHLORIDE 0.9 % IV BOLUS: 1000.0000 mL | Freq: Once | INTRAVENOUS | Status: DC

## 2024-09-09 MED ORDER — SODIUM CHLORIDE 0.9 % IV BOLUS
1000.0000 mL | Freq: Once | INTRAVENOUS | Status: AC
  Administered 2024-09-09: 11:00:00 1000 mL via INTRAVENOUS

## 2024-09-09 NOTE — Progress Notes (Signed)
 EXAM: NUCLEAR MEDICINE PLUVICTO  INJECTION 09/09/2024 12:43:03 PM TECHNIQUE: Patient presented to nuclear medicine for treatment of metastatic prostate cancer. PSMA PET scan was reviewed. The patient's most recent labs were reviewed and it was determined to proceed with Lu-177 Pluvicto . Written informed consent was obtained from the patient. Written instructions for radiation safety were provided. nitial assay: 204 mCi. The radiopharmaceutical was injected intravenously according to local protocol. RADIOPHARMACEUTICAL: 203.5 mCi Lu-177 PLUVICTO  COMPARISON: None available. CLINICAL HISTORY: prostate cancer metastatic to multiple sites FINDINGS: Current Infusion: TX# 6. I Planned Infusions: 6 Patient reports no interval adverse event. Patient is overall very well with some mild back pain. Patient remains active and in good spirits. Mild chronic anemia and thrombocytopenia unchanged. Renal function normal. PSA stable at 526. IMPRESSION: Treatment 6 of 6 planned Lu-177 Pluvicto  treatments for metastatic prostate cancer. The patient tolerated the infusion well without immediate complication.

## 2024-09-09 NOTE — Written Directive (Addendum)
°  PLUVICTO   THERAPY   RADIOPHARMACEUTICAL: Lutetium 177 vipivotide tetraxetan (Pluvicto )     PRESCRIBED DOSE FOR ADMINISTRATION:  200 mCi   ROUTE OFADMINISTRATION:  IV   DIAGNOSIS:   prostate cancer metastatic to multiple sites  Dx: Prostate cancer metastatic to multiple sites [facility]) [email or web address] (ICD-10-CM)]     REFERRING PHYSICIAN: Federico Norleen ONEIDA MADISON, MD     TREATMENT #: 6   ADDITIONAL PHYSICIAN COMMENTS/NOTES:   AUTHORIZED USER SIGNATURE & TIME STAMP: Norleen GORMAN Boxer, MD   09/09/2024    11:37 AM

## 2024-10-27 ENCOUNTER — Other Ambulatory Visit: Payer: Self-pay | Admitting: Hematology and Oncology

## 2024-10-27 DIAGNOSIS — C61 Malignant neoplasm of prostate: Secondary | ICD-10-CM

## 2024-10-27 NOTE — Progress Notes (Unsigned)
 " [facility] Telephone:(336) [phone]   Fax:(336) [phone]  PROGRESS NOTE  Patient Care Team: Henriette Anes, DO as PCP - General (Family Medicine) Alvan Dorn FALCON, MD as PCP - Cardiology (Cardiology)  Hematological/Oncological History # Metastatic Castrate Sensitive Prostate Cancer  09/30/2021: percutaneous nephrostomy tube placement and retroperitoneal lymph node biopsy  10/01/2021: Firmagon  240 mg  09/2021: radiation therapy to the lumbar spine after receiving 30 Gray in 10 fractions  11/01/2021: started Eligard  30 mg every 4 months and Zytiga  1000 mg daily with prednisone  5 mg daily  09/25/2022: last visit with Dr. Amadeo 10/29/2022: transition care to Dr. Federico. Insurance no longer assisting with Zytiga  1000 mg daily, converting enzalutamide  160 mg PO daily.   08/22/2023: PET scan shows progression 10/28/2023: cycle 1, day 1  of docetaxel  01/19/2024: Discontinue docetaxel  chemotherapy prior to cycle 5 as patient is not able to tolerate treatment.  02/25/2024: Dose 1 of Pluvicto  therapy.   Interval History:  Erik Page [age] y.o. male with medical history significant for [facility] presents for a follow up visit. The patient's last visit was on 09/02/2024. He presents today after completion of cycle 6 of Pluvicto .   On exam today [name] reports he has been well overall since our last visit.  He reports he tolerated his Pluvicto  therapy well with no major side effects.  He reports his energy and appetite are strong but does occasionally have some stomach upset.  He reports he can get a active pain in his stomach which is resolved with either a bowel movement or occasionally vomiting.  He reports that he feels like his energy levels are quite good and he is doing his best to stay physically active.  He notes he had no trouble with fevers, chills, sweats, nausea, vomiting or diarrhea.  He said no issues with bleeding, bruising, or dark stools.  Full 10 point ROS is otherwise  negative.  MEDICAL HISTORY:  Past Medical History:  Diagnosis Date   Cancer [facility])    Prostate   HTN (hypertension)    Myocardial infarction [facility])     SURGICAL HISTORY: Past Surgical History:  Procedure Laterality Date   CARDIAC CATHETERIZATION N/A 08/15/2015   Procedure: Left Heart Cath and Coronary Angiography;  Surgeon: Alm LELON Clay, MD;  Location: [facility] INVASIVE CV LAB;  Service: Cardiovascular;  Laterality: N/A;   CARDIAC CATHETERIZATION N/A 08/15/2015   Procedure: Left Heart Cath and Coronary Angiography;  Surgeon: Alm LELON Clay, MD;  Location: [facility] INVASIVE CV LAB;  Service: Cardiovascular;  Laterality: N/A;   CARDIAC CATHETERIZATION N/A 08/15/2015   Procedure: Coronary Stent Intervention;  Surgeon: Alm LELON Clay, MD;  Location: [facility] INVASIVE CV LAB;  Service: Cardiovascular;  Laterality: N/A;   IR CONVERT RIGHT NEPHROSTOMY TO NEPHROURETERAL CATH  11/12/2021   IR EXT NEPHROURETERAL CATH EXCHANGE  01/07/2022   IR NEPHROSTOMY EXCHANGE RIGHT  02/20/2022   IR NEPHROSTOMY PLACEMENT RIGHT  10/01/2021   TONSILLECTOMY      SOCIAL HISTORY: Social History   Socioeconomic History   Marital status: Married    Spouse name: Not on file   Number of children: Not on file   Years of education: Not on file   Highest education level: Not on file  Occupational History   Not on file  Tobacco Use   Smoking status: Never   Smokeless tobacco: Never  Vaping Use   Vaping status: Never Used  Substance and Sexual Activity   Alcohol use: No    Alcohol/week: 0.0 standard  drinks of alcohol   Drug use: No   Sexual activity: Not on file  Other Topics Concern   Not on file  Social History Narrative   Works as a Pensions Consultant; 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   Social Drivers of Health   Tobacco Use: Low Risk (06/03/2024)   Patient History    Smoking Tobacco Use: Never    Smokeless Tobacco Use: Never    Passive Exposure: Not on file  Financial Resource Strain: Not on file  Food Insecurity: Not  on file  Transportation Needs: Not on file  Physical Activity: Not on file  Stress: Not on file  Social Connections: Not on file  Intimate Partner Violence: Not on file  Depression (PHQ2-9): Low Risk (10/28/2024)   Depression (PHQ2-9)    PHQ-2 Score: 0  Alcohol Screen: Not on file  Housing: Not on file  Utilities: Not on file  Health Literacy: Not on file    FAMILY HISTORY: Family History  Problem Relation Age of Onset   Hypertension Other     ALLERGIES:  has no known allergies.  MEDICATIONS:  Current Outpatient Medications  Medication Sig Dispense Refill   ALPRAZolam  (XANAX ) 0.25 MG tablet Take 1 tablet (0.25 mg total) by mouth at bedtime as needed for anxiety. 60 tablet 0   aspirin  EC 81 MG EC tablet Take 1 tablet (81 mg total) by mouth daily.     atorvastatin  (LIPITOR ) 80 MG tablet TAKE 1 TABLET BY MOUTH EVERY DAY 90 tablet 1   calcium -vitamin D (OSCAL WITH D) 500-5 MG-MCG tablet Take 1 tablet by mouth 2 (two) times daily. 90 tablet 3   feeding supplement (ENSURE ENLIVE / ENSURE PLUS) LIQD Take 237 mLs by mouth 2 (two) times daily between meals. 237 mL 12   furosemide  (LASIX ) 20 MG tablet TAKE 1 TABLET (20 MG TOTAL) BY MOUTH DAILY. AS NEEDED FOR SWELLING/EDEMA IN LEGS 90 tablet 1   ibuprofen  (ADVIL ) 800 MG tablet TAKE 1 TABLET BY MOUTH THREE TIMES A DAY 90 tablet 1   metoprolol  succinate (TOPROL  XL) 25 MG 24 hr tablet Take 1 tablet (25 mg total) by mouth daily. 90 tablet 2   nitroGLYCERIN  (NITROSTAT ) 0.4 MG SL tablet DISSOLVE 1 TAB UNDER TONGUE FOR CHEST PAIN - IF PAIN REMAINS AFTER 5 MIN, CALL 911 AND REPEAT DOSE. MAX 3 TABS IN 15 MINUTES 25 tablet 3   ondansetron  (ZOFRAN ) 8 MG tablet TAKE 1 TABLET BY MOUTH EVERY 8 HOURS AS NEEDED 30 tablet 0   oxyCODONE  (OXY IR/ROXICODONE ) 5 MG immediate release tablet Take 2 tablets (10 mg total) by mouth every 4 (four) hours as needed for severe pain (pain score 7-10). 120 tablet 0   potassium chloride  SA (KLOR-CON  M) 20 MEQ tablet TAKE 1  TABLET (20 MEQ TOTAL) BY MOUTH DAILY. WHEN TAKING LASIX  90 tablet 1   prochlorperazine  (COMPAZINE ) 10 MG tablet Take 1 tablet (10 mg total) by mouth every 6 (six) hours as needed for nausea or vomiting. 30 tablet 0   senna-docusate (SENOKOT-S) 8.6-50 MG tablet Take 1 tablet by mouth at bedtime as needed for mild constipation. 30 tablet 0   No current facility-administered medications for this visit.    REVIEW OF SYSTEMS:   Constitutional: ( - ) fevers, ( - )  chills , ( - ) night sweats Eyes: ( - ) blurriness of vision, ( - ) double vision, ( - ) watery eyes Ears, nose, mouth, throat, and face: ( - ) mucositis, ( - )  sore throat Respiratory: ( - ) cough, ( - ) dyspnea, ( - ) wheezes Cardiovascular: ( - ) palpitation, ( - ) chest discomfort, ( - ) lower extremity swelling Gastrointestinal:  ( - ) nausea, ( - ) heartburn, ( - ) change in bowel habits Skin: ( - ) abnormal skin rashes Lymphatics: ( - ) new lymphadenopathy, ( - ) easy bruising Neurological: ( - ) numbness, ( - ) tingling, ( - ) new weaknesses Behavioral/Psych: ( - ) mood change, ( - ) new changes  All other systems were reviewed with the patient and are negative.  PHYSICAL EXAMINATION: ECOG PERFORMANCE STATUS: 0 - Asymptomatic  Vitals:   10/28/24 1047  BP: 99/66  Pulse: 70  Resp: 16  Temp: (!) 97.2 F (36.2 C)  SpO2: 100%         Filed Weights   10/28/24 1047  Weight: 233 lb (105.7 kg)         GENERAL: Well-appearing elderly African-American male, alert, no distress and comfortable SKIN: skin color, texture, turgor are normal, no rashes or significant lesions EYES: conjunctiva are pink and non-injected, sclera clear LUNGS: clear to auscultation and percussion with normal breathing effort HEART: regular rate & rhythm and no murmurs and no lower extremity edema Musculoskeletal: no cyanosis of digits and no clubbing  PSYCH: alert & oriented x 3, fluent speech NEURO: no focal motor/sensory  deficits  LABORATORY DATA:  I have reviewed the data as listed    Latest Ref Rng & Units 10/28/2024   10:14 AM 09/02/2024    9:13 AM 07/29/2024    9:37 AM  CBC  WBC 4.0 - 10.5 K/uL 2.5  2.3  1.9   Hemoglobin 13.0 - 17.0 g/dL 89.0  89.3  89.8   Hematocrit 39.0 - 52.0 % 31.6  31.7  30.6   Platelets 150 - 400 K/uL 130  128  135        Latest Ref Rng & Units 10/28/2024   10:14 AM 09/02/2024    9:13 AM 07/29/2024    9:37 AM  CMP  Glucose 70 - 99 mg/dL 890  889  897   BUN 8 - 23 mg/dL 13  13  13    Creatinine 0.61 - 1.24 mg/dL 9.11  9.18  9.19   Sodium 135 - 145 mmol/L 141  141  140   Potassium 3.5 - 5.1 mmol/L 4.4  4.2  4.0   Chloride 98 - 111 mmol/L 105  105  108   CO2 22 - 32 mmol/L 26  27  28    Calcium  8.9 - 10.3 mg/dL 9.0  8.8  8.9   Total Protein 6.5 - 8.1 g/dL 6.7  6.6  6.2   Total Bilirubin 0.0 - 1.2 mg/dL 1.3  1.3  1.3   Alkaline Phos 38 - 126 U/L 234  241  223   AST 15 - 41 U/L 18  19  12    ALT 0 - 44 U/L 13  12  10      RADIOGRAPHIC STUDIES: No results found.     ASSESSMENT & PLAN Erik Page is a 74 y.o. male with medical history significant for [facility] presents for a follow up visit.   # Metastatic Castrate Sensitive Prostate Cancer  --continue lupron  30mg  subq q 4 months indefinitely  --started Eligard  30 mg every 4 months and Zytiga  1000 mg daily with prednisone  5 mg daily on 11/01/2021:   --transition from Zytiga  to enzalutamide  160mg  PO daily due to cost.  Was able to get approval to return to Zytiga  due to faillure of enzalutamide .  --PET scan from 08/22/2023 showed progression with multiple new and progressive bone lesions.  --treatment 1 of Pluvicto  administered on 02/25/2024.  PLAN: --Due for cycle 6 of Pluvicto  next week.  --After discussion of treatment options moving forward the patient noted that he would like to pursue Pluvicto  therapy.  Discussed risks and benefits. --Labs today show white blood cell count 2.5, hemoglobin 10.9, MCV 95.2, platelets 130 --  Last PSA 526.68 on 09/02/2024 --Will administer Xgeva  q 2 months, last one was 06/17/2024. Next due today and again in Feb 2026 (today).  --Will administer Lupron  every 4 months, last one was on 07/29/2024 so next one will be due around March 2026  -- RTC in 4 weeks for injections   #Hypokalemia: --Potassium level is 4.0  --Advised to incorporate potassium rich foods into his diet.    No orders of the defined types were placed in this encounter.   All questions were answered. The patient knows to call the clinic with any problems, questions or concerns.  A total of more than 30 minutes were spent on this encounter with face-to-face time and non-face-to-face time, including preparing to see the patient, ordering tests and/or medications, counseling the patient and coordination of care as outlined above.   Norleen IVAR Kidney, MD Department of Hematology/Oncology [facility] at [facility] Phone: [phone] Pager: [phone] Email: [email or web address]   10/28/2024 5:28 PM "

## 2024-10-28 ENCOUNTER — Ambulatory Visit: Payer: Self-pay | Admitting: *Deleted

## 2024-10-28 ENCOUNTER — Inpatient Hospital Stay: Attending: Hematology and Oncology

## 2024-10-28 ENCOUNTER — Inpatient Hospital Stay: Admitting: Hematology and Oncology

## 2024-10-28 VITALS — BP 99/66 | HR 70 | Temp 97.2°F | Resp 16 | Ht 74.0 in | Wt 233.0 lb

## 2024-10-28 DIAGNOSIS — C61 Malignant neoplasm of prostate: Secondary | ICD-10-CM

## 2024-10-28 LAB — CBC WITH DIFFERENTIAL (CANCER CENTER ONLY)
Abs Immature Granulocytes: 0.01 10*3/uL (ref 0.00–0.07)
Basophils Absolute: 0 10*3/uL (ref 0.0–0.1)
Basophils Relative: 1 %
Eosinophils Absolute: 0.1 10*3/uL (ref 0.0–0.5)
Eosinophils Relative: 2 %
HCT: 31.6 % — ABNORMAL LOW (ref 39.0–52.0)
Hemoglobin: 10.9 g/dL — ABNORMAL LOW (ref 13.0–17.0)
Immature Granulocytes: 0 %
Lymphocytes Relative: 22 %
Lymphs Abs: 0.6 10*3/uL — ABNORMAL LOW (ref 0.7–4.0)
MCH: 32.8 pg (ref 26.0–34.0)
MCHC: 34.5 g/dL (ref 30.0–36.0)
MCV: 95.2 fL (ref 80.0–100.0)
Monocytes Absolute: 0.2 10*3/uL (ref 0.1–1.0)
Monocytes Relative: 7 %
Neutro Abs: 1.6 10*3/uL — ABNORMAL LOW (ref 1.7–7.7)
Neutrophils Relative %: 68 %
Platelet Count: 130 10*3/uL — ABNORMAL LOW (ref 150–400)
RBC: 3.32 MIL/uL — ABNORMAL LOW (ref 4.22–5.81)
RDW: 13.3 % (ref 11.5–15.5)
WBC Count: 2.5 10*3/uL — ABNORMAL LOW (ref 4.0–10.5)
nRBC: 0 % (ref 0.0–0.2)

## 2024-10-28 LAB — CMP (CANCER CENTER ONLY)
ALT: 13 U/L (ref 0–44)
AST: 18 U/L (ref 15–41)
Albumin: 4.2 g/dL (ref 3.5–5.0)
Alkaline Phosphatase: 234 U/L — ABNORMAL HIGH (ref 38–126)
Anion gap: 10 (ref 5–15)
BUN: 13 mg/dL (ref 8–23)
CO2: 26 mmol/L (ref 22–32)
Calcium: 9 mg/dL (ref 8.9–10.3)
Chloride: 105 mmol/L (ref 98–111)
Creatinine: 0.88 mg/dL (ref 0.61–1.24)
GFR, Estimated: >60 mL/min
Glucose, Bld: 109 mg/dL — ABNORMAL HIGH (ref 70–99)
Potassium: 4.4 mmol/L (ref 3.5–5.1)
Sodium: 141 mmol/L (ref 135–145)
Total Bilirubin: 1.3 mg/dL — ABNORMAL HIGH (ref 0.0–1.2)
Total Protein: 6.7 g/dL (ref 6.5–8.1)

## 2024-10-28 LAB — PSA: Prostatic Specific Antigen: 430 ng/mL — ABNORMAL HIGH (ref 0.00–4.00)

## 2024-10-28 MED ORDER — DENOSUMAB 120 MG/1.7ML ~~LOC~~ SOLN
120.0000 mg | Freq: Once | SUBCUTANEOUS | Status: AC
  Administered 2024-10-28: 120 mg via SUBCUTANEOUS
  Filled 2024-10-28: qty 1.7

## 2024-10-28 NOTE — Telephone Encounter (Signed)
 TCT patient regarding today's PSA result. Spoke with patient and advised that his PSA has come down to 430. Pt very pleased with results.

## 2024-10-28 NOTE — Telephone Encounter (Signed)
-----   Message from Norleen Kidney, MD sent at 10/28/2024  3:09 PM EST ----- Please let Mr. Croft know that his PSA dropped to 430

## 2024-10-29 ENCOUNTER — Telehealth: Payer: Self-pay | Admitting: Hematology and Oncology

## 2024-10-29 NOTE — Telephone Encounter (Signed)
 Attempted to call pt to get scheduled for future appts

## 2024-12-06 ENCOUNTER — Ambulatory Visit: Admitting: Cardiology
